# Patient Record
Sex: Male | Born: 1935 | Race: Black or African American | Hispanic: No | Marital: Married | Smoking: Never smoker
Health system: Southern US, Community
[De-identification: ages and names within clinical notes are randomized; demographics above are authoritative.]

## PROBLEM LIST (undated history)

## (undated) DIAGNOSIS — N200 Calculus of kidney: Secondary | ICD-10-CM

## (undated) DIAGNOSIS — I442 Atrioventricular block, complete: Secondary | ICD-10-CM

## (undated) DIAGNOSIS — G629 Polyneuropathy, unspecified: Secondary | ICD-10-CM

## (undated) DIAGNOSIS — G4730 Sleep apnea, unspecified: Secondary | ICD-10-CM

## (undated) DIAGNOSIS — M1990 Unspecified osteoarthritis, unspecified site: Secondary | ICD-10-CM

## (undated) DIAGNOSIS — E0590 Thyrotoxicosis, unspecified without thyrotoxic crisis or storm: Secondary | ICD-10-CM

## (undated) DIAGNOSIS — IMO0001 Reserved for inherently not codable concepts without codable children: Secondary | ICD-10-CM

## (undated) DIAGNOSIS — E119 Type 2 diabetes mellitus without complications: Secondary | ICD-10-CM

## (undated) DIAGNOSIS — I2510 Atherosclerotic heart disease of native coronary artery without angina pectoris: Secondary | ICD-10-CM

## (undated) DIAGNOSIS — E785 Hyperlipidemia, unspecified: Secondary | ICD-10-CM

## (undated) DIAGNOSIS — N189 Chronic kidney disease, unspecified: Secondary | ICD-10-CM

## (undated) DIAGNOSIS — N400 Enlarged prostate without lower urinary tract symptoms: Secondary | ICD-10-CM

## (undated) DIAGNOSIS — T8859XA Other complications of anesthesia, initial encounter: Secondary | ICD-10-CM

## (undated) DIAGNOSIS — M21371 Foot drop, right foot: Secondary | ICD-10-CM

## (undated) DIAGNOSIS — I495 Sick sinus syndrome: Secondary | ICD-10-CM

## (undated) DIAGNOSIS — E1140 Type 2 diabetes mellitus with diabetic neuropathy, unspecified: Secondary | ICD-10-CM

## (undated) DIAGNOSIS — I10 Essential (primary) hypertension: Secondary | ICD-10-CM

## (undated) DIAGNOSIS — Z950 Presence of cardiac pacemaker: Secondary | ICD-10-CM

## (undated) HISTORY — PX: CARDIAC CATHETERIZATION: SHX172

## (undated) HISTORY — DX: Foot drop, right foot: M21.371

## (undated) HISTORY — PX: HERNIA REPAIR: SHX51

## (undated) HISTORY — PX: EYE SURGERY: SHX253

## (undated) HISTORY — PX: MASS EXCISION: SHX2000

---

## 2005-05-21 ENCOUNTER — Ambulatory Visit: Payer: Self-pay | Admitting: Gastroenterology

## 2008-02-26 ENCOUNTER — Ambulatory Visit: Payer: Self-pay | Admitting: Urology

## 2008-03-10 ENCOUNTER — Ambulatory Visit: Payer: Self-pay | Admitting: Urology

## 2008-04-14 ENCOUNTER — Ambulatory Visit: Payer: Self-pay | Admitting: Urology

## 2008-04-25 ENCOUNTER — Ambulatory Visit: Payer: Self-pay | Admitting: Urology

## 2008-07-25 ENCOUNTER — Ambulatory Visit: Payer: Self-pay | Admitting: Urology

## 2009-02-23 ENCOUNTER — Ambulatory Visit: Payer: Self-pay | Admitting: Urology

## 2009-03-31 ENCOUNTER — Ambulatory Visit: Payer: Self-pay | Admitting: Gastroenterology

## 2009-03-31 LAB — HM COLONOSCOPY

## 2011-03-13 ENCOUNTER — Ambulatory Visit: Payer: Self-pay | Admitting: Urology

## 2012-04-13 LAB — TSH: TSH: 0.44 u[IU]/mL (ref <=5.90)

## 2013-04-05 LAB — CBC AND DIFFERENTIAL
HEMATOCRIT: 43 % (ref 41–53)
Hemoglobin: 14.3 g/dL (ref 13.5–17.5)
Neutrophils Absolute: 50 /uL
PLATELETS: 193 10*3/uL (ref 150–399)
WBC: 5.2 10*3/mL

## 2013-04-06 DIAGNOSIS — N200 Calculus of kidney: Secondary | ICD-10-CM | POA: Insufficient documentation

## 2013-04-06 DIAGNOSIS — R972 Elevated prostate specific antigen [PSA]: Secondary | ICD-10-CM | POA: Insufficient documentation

## 2013-04-06 DIAGNOSIS — R3129 Other microscopic hematuria: Secondary | ICD-10-CM | POA: Insufficient documentation

## 2013-04-06 DIAGNOSIS — N138 Other obstructive and reflux uropathy: Secondary | ICD-10-CM | POA: Insufficient documentation

## 2013-04-06 DIAGNOSIS — R339 Retention of urine, unspecified: Secondary | ICD-10-CM | POA: Insufficient documentation

## 2013-06-08 DIAGNOSIS — R5381 Other malaise: Secondary | ICD-10-CM | POA: Diagnosis not present

## 2013-06-08 DIAGNOSIS — E7800 Pure hypercholesterolemia, unspecified: Secondary | ICD-10-CM | POA: Diagnosis not present

## 2013-06-08 DIAGNOSIS — Z23 Encounter for immunization: Secondary | ICD-10-CM | POA: Diagnosis not present

## 2013-06-08 DIAGNOSIS — Z79899 Other long term (current) drug therapy: Secondary | ICD-10-CM | POA: Diagnosis not present

## 2013-06-08 DIAGNOSIS — R5383 Other fatigue: Secondary | ICD-10-CM | POA: Diagnosis not present

## 2013-06-08 DIAGNOSIS — E039 Hypothyroidism, unspecified: Secondary | ICD-10-CM | POA: Diagnosis not present

## 2013-06-08 DIAGNOSIS — I2510 Atherosclerotic heart disease of native coronary artery without angina pectoris: Secondary | ICD-10-CM | POA: Diagnosis not present

## 2013-06-08 DIAGNOSIS — Z0000 Encounter for general adult medical examination without abnormal findings: Secondary | ICD-10-CM | POA: Diagnosis not present

## 2013-07-07 DIAGNOSIS — N200 Calculus of kidney: Secondary | ICD-10-CM | POA: Diagnosis not present

## 2013-07-07 DIAGNOSIS — R972 Elevated prostate specific antigen [PSA]: Secondary | ICD-10-CM | POA: Diagnosis not present

## 2013-07-07 DIAGNOSIS — R339 Retention of urine, unspecified: Secondary | ICD-10-CM | POA: Diagnosis not present

## 2013-07-07 DIAGNOSIS — N401 Enlarged prostate with lower urinary tract symptoms: Secondary | ICD-10-CM | POA: Diagnosis not present

## 2013-08-19 DIAGNOSIS — R972 Elevated prostate specific antigen [PSA]: Secondary | ICD-10-CM | POA: Diagnosis not present

## 2013-10-04 DIAGNOSIS — R5381 Other malaise: Secondary | ICD-10-CM | POA: Diagnosis not present

## 2013-10-04 DIAGNOSIS — I10 Essential (primary) hypertension: Secondary | ICD-10-CM | POA: Diagnosis not present

## 2013-10-04 DIAGNOSIS — R7309 Other abnormal glucose: Secondary | ICD-10-CM | POA: Diagnosis not present

## 2013-10-04 DIAGNOSIS — Z79899 Other long term (current) drug therapy: Secondary | ICD-10-CM | POA: Diagnosis not present

## 2013-10-04 DIAGNOSIS — I2510 Atherosclerotic heart disease of native coronary artery without angina pectoris: Secondary | ICD-10-CM | POA: Diagnosis not present

## 2013-10-04 DIAGNOSIS — R5383 Other fatigue: Secondary | ICD-10-CM | POA: Diagnosis not present

## 2013-10-12 DIAGNOSIS — E0590 Thyrotoxicosis, unspecified without thyrotoxic crisis or storm: Secondary | ICD-10-CM | POA: Diagnosis not present

## 2013-10-19 DIAGNOSIS — E0590 Thyrotoxicosis, unspecified without thyrotoxic crisis or storm: Secondary | ICD-10-CM | POA: Diagnosis not present

## 2013-10-19 DIAGNOSIS — I10 Essential (primary) hypertension: Secondary | ICD-10-CM | POA: Insufficient documentation

## 2013-10-19 DIAGNOSIS — D126 Benign neoplasm of colon, unspecified: Secondary | ICD-10-CM | POA: Insufficient documentation

## 2013-11-25 DIAGNOSIS — H2510 Age-related nuclear cataract, unspecified eye: Secondary | ICD-10-CM | POA: Diagnosis not present

## 2014-03-07 DIAGNOSIS — I10 Essential (primary) hypertension: Secondary | ICD-10-CM | POA: Diagnosis not present

## 2014-03-07 DIAGNOSIS — E1143 Type 2 diabetes mellitus with diabetic autonomic (poly)neuropathy: Secondary | ICD-10-CM | POA: Diagnosis not present

## 2014-03-07 DIAGNOSIS — Z23 Encounter for immunization: Secondary | ICD-10-CM | POA: Diagnosis not present

## 2014-03-07 LAB — HEMOGLOBIN A1C: HEMOGLOBIN A1C: 5.6 % (ref 4.0–6.0)

## 2014-04-04 DIAGNOSIS — I259 Chronic ischemic heart disease, unspecified: Secondary | ICD-10-CM | POA: Diagnosis not present

## 2014-04-04 DIAGNOSIS — E780 Pure hypercholesterolemia: Secondary | ICD-10-CM | POA: Diagnosis not present

## 2014-04-04 DIAGNOSIS — I10 Essential (primary) hypertension: Secondary | ICD-10-CM | POA: Diagnosis not present

## 2014-04-04 LAB — LIPID PANEL: Triglycerides: 46 mg/dL (ref 40–160)

## 2014-04-06 DIAGNOSIS — I2510 Atherosclerotic heart disease of native coronary artery without angina pectoris: Secondary | ICD-10-CM | POA: Diagnosis not present

## 2014-04-06 LAB — LIPID PANEL
CHOLESTEROL: 243 mg/dL — AB (ref 0–200)
HDL: 77 mg/dL — AB (ref 35–70)
LDL Cholesterol: 157 mg/dL
LDl/HDL Ratio: 2

## 2014-04-06 LAB — BASIC METABOLIC PANEL
BUN: 15 mg/dL (ref 4–21)
Creatinine: 0.8 mg/dL (ref <=1.3)
Glucose: 146 mg/dL
POTASSIUM: 4 mmol/L (ref 3.4–5.3)
SODIUM: 143 mmol/L (ref 137–147)

## 2014-04-06 LAB — HEPATIC FUNCTION PANEL
ALT: 38 U/L (ref 10–40)
AST: 41 U/L — AB (ref 14–40)
Alkaline Phosphatase: 100 U/L (ref 25–125)
Bilirubin, Total: 0.4 mg/dL

## 2014-04-19 DIAGNOSIS — E0590 Thyrotoxicosis, unspecified without thyrotoxic crisis or storm: Secondary | ICD-10-CM | POA: Diagnosis not present

## 2014-04-19 DIAGNOSIS — M791 Myalgia: Secondary | ICD-10-CM | POA: Diagnosis not present

## 2014-08-09 DIAGNOSIS — N401 Enlarged prostate with lower urinary tract symptoms: Secondary | ICD-10-CM | POA: Diagnosis not present

## 2014-08-09 DIAGNOSIS — N200 Calculus of kidney: Secondary | ICD-10-CM | POA: Diagnosis not present

## 2014-08-09 DIAGNOSIS — R339 Retention of urine, unspecified: Secondary | ICD-10-CM | POA: Diagnosis not present

## 2014-08-09 DIAGNOSIS — R972 Elevated prostate specific antigen [PSA]: Secondary | ICD-10-CM | POA: Diagnosis not present

## 2014-08-09 DIAGNOSIS — M479 Spondylosis, unspecified: Secondary | ICD-10-CM | POA: Diagnosis not present

## 2014-08-31 DIAGNOSIS — R339 Retention of urine, unspecified: Secondary | ICD-10-CM | POA: Diagnosis not present

## 2014-09-06 DIAGNOSIS — E780 Pure hypercholesterolemia: Secondary | ICD-10-CM | POA: Diagnosis not present

## 2014-09-06 DIAGNOSIS — Z0000 Encounter for general adult medical examination without abnormal findings: Secondary | ICD-10-CM | POA: Diagnosis not present

## 2014-09-06 DIAGNOSIS — I259 Chronic ischemic heart disease, unspecified: Secondary | ICD-10-CM | POA: Diagnosis not present

## 2014-10-17 DIAGNOSIS — G4730 Sleep apnea, unspecified: Secondary | ICD-10-CM | POA: Insufficient documentation

## 2014-10-17 DIAGNOSIS — E785 Hyperlipidemia, unspecified: Secondary | ICD-10-CM | POA: Insufficient documentation

## 2014-10-17 DIAGNOSIS — D4410 Neoplasm of uncertain behavior of unspecified adrenal gland: Secondary | ICD-10-CM | POA: Insufficient documentation

## 2014-10-17 DIAGNOSIS — E1140 Type 2 diabetes mellitus with diabetic neuropathy, unspecified: Secondary | ICD-10-CM | POA: Insufficient documentation

## 2014-10-17 DIAGNOSIS — M779 Enthesopathy, unspecified: Secondary | ICD-10-CM | POA: Insufficient documentation

## 2014-10-17 DIAGNOSIS — N400 Enlarged prostate without lower urinary tract symptoms: Secondary | ICD-10-CM | POA: Insufficient documentation

## 2014-10-17 DIAGNOSIS — I2510 Atherosclerotic heart disease of native coronary artery without angina pectoris: Secondary | ICD-10-CM | POA: Insufficient documentation

## 2014-10-17 DIAGNOSIS — E1159 Type 2 diabetes mellitus with other circulatory complications: Secondary | ICD-10-CM | POA: Insufficient documentation

## 2014-10-17 DIAGNOSIS — E1169 Type 2 diabetes mellitus with other specified complication: Secondary | ICD-10-CM | POA: Insufficient documentation

## 2014-10-17 DIAGNOSIS — I10 Essential (primary) hypertension: Secondary | ICD-10-CM | POA: Insufficient documentation

## 2014-10-17 DIAGNOSIS — E0590 Thyrotoxicosis, unspecified without thyrotoxic crisis or storm: Secondary | ICD-10-CM | POA: Insufficient documentation

## 2014-10-17 DIAGNOSIS — G629 Polyneuropathy, unspecified: Secondary | ICD-10-CM | POA: Insufficient documentation

## 2014-10-17 DIAGNOSIS — E119 Type 2 diabetes mellitus without complications: Secondary | ICD-10-CM | POA: Insufficient documentation

## 2014-12-14 ENCOUNTER — Encounter: Payer: Self-pay | Admitting: Family Medicine

## 2014-12-14 ENCOUNTER — Other Ambulatory Visit: Payer: Self-pay | Admitting: Family Medicine

## 2014-12-14 ENCOUNTER — Ambulatory Visit (INDEPENDENT_AMBULATORY_CARE_PROVIDER_SITE_OTHER): Payer: Commercial Managed Care - HMO | Admitting: Family Medicine

## 2014-12-14 VITALS — BP 118/64 | HR 62 | Temp 97.8°F | Resp 16 | Ht 66.0 in | Wt 170.0 lb

## 2014-12-14 DIAGNOSIS — I159 Secondary hypertension, unspecified: Secondary | ICD-10-CM

## 2014-12-14 DIAGNOSIS — G629 Polyneuropathy, unspecified: Secondary | ICD-10-CM | POA: Diagnosis not present

## 2014-12-14 DIAGNOSIS — E785 Hyperlipidemia, unspecified: Secondary | ICD-10-CM | POA: Diagnosis not present

## 2014-12-14 DIAGNOSIS — R200 Anesthesia of skin: Secondary | ICD-10-CM | POA: Diagnosis not present

## 2014-12-14 DIAGNOSIS — E119 Type 2 diabetes mellitus without complications: Secondary | ICD-10-CM | POA: Diagnosis not present

## 2014-12-14 LAB — POCT GLYCOSYLATED HEMOGLOBIN (HGB A1C): HEMOGLOBIN A1C: 6.1

## 2014-12-14 NOTE — Progress Notes (Signed)
Patient ID: Todd Ortega, male   DOB: 1935-01-23, 80 y.o.   MRN: [id number]    Subjective:  Diabetes He presents for his follow-up diabetic visit. He has type 2 diabetes mellitus. Associated symptoms include foot paresthesias. Symptoms are stable.  Hypertension This is a chronic problem. The current episode started more than 1 year ago.  Coronary Artery Disease Presents for follow-up visit. Risk factors include hypertension.     Prior to Admission medications   Medication Sig Start Date End Date Taking? Authorizing Provider  aspirin 81 MG tablet Take by mouth. 08/05/12  Yes Historical Provider, MD  finasteride (PROSCAR) 5 MG tablet Take by mouth.   Yes Historical Provider, MD  glucose blood test strip BAYER CONTOUR TEST (In Vitro Strip)  1 (one) Strip Strip check sugars once daily, or as needed for 0 days  Quantity: 300;  Refills: 4   Ordered :31-Mar-2013  [name] ;  Started 31-Mar-2013 Active Comments: Medication taken as needed. 03/31/13  Yes Historical Provider, MD  lisinopril (PRINIVIL,ZESTRIL) 40 MG tablet Take by mouth. 08/29/14  Yes Historical Provider, MD  methimazole (TAPAZOLE) 5 MG tablet Take by mouth. 08/05/12  Yes Historical Provider, MD  simvastatin (ZOCOR) 40 MG tablet Take by mouth. 08/02/14  Yes Historical Provider, MD  tamsulosin (FLOMAX) 0.4 MG CAPS capsule Take 0.4 mg by mouth. 08/09/14  Yes Historical Provider, MD  CALCIUM CARBONATE-VITAMIN D PO Take by mouth. 08/05/12   Historical Provider, MD  ezetimibe (ZETIA) 10 MG tablet Take by mouth.    Historical Provider, MD  levothyroxine (SYNTHROID, LEVOTHROID) 175 MCG tablet Take by mouth.    Historical Provider, MD  meloxicam (MOBIC) 7.5 MG tablet Take by mouth.    Historical Provider, MD  Omega-3 Fatty Acids (FISH OIL) 1200 MG CAPS Take by mouth. 08/05/12   Historical Provider, MD  traMADol (ULTRAM) 50 MG tablet Take by mouth.    Historical Provider, MD    Patient Active Problem List   Diagnosis Date Noted  . Benign fibroma  of prostate 10/17/2014  . Arteriosclerosis of coronary artery 10/17/2014  . Essential (primary) hypertension 10/17/2014  . Enthesopathy 10/17/2014  . HLD (hyperlipidemia) 10/17/2014  . Hyperthyroidism 10/17/2014  . 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 Neuropathy 10/17/2014  . Apnea, sleep 10/17/2014  . Diabetes mellitus, type 2 10/17/2014  . Benign neoplasm of colon 10/19/2013  . BP (high blood pressure) 10/19/2013  . Benign prostatic hyperplasia with urinary obstruction 04/06/2013  . Calculus of kidney 04/06/2013  . Abnormal prostate specific antigen 04/06/2013  . Frank hematuria 04/06/2013  . Incomplete bladder emptying 04/06/2013    No past medical history on file.  History   Social History  . Marital Status: Married    Spouse Name: N/A  . Number of Children: N/A  . Years of Education: N/A   Occupational History  . Not on file.   Social History Main Topics  . Smoking status: Never Smoker   . Smokeless tobacco: Not on file  . Alcohol Use: No  . Drug Use: No  . Sexual Activity: Not on file   Other Topics Concern  . Not on file   Social History Narrative    No Known Allergies  Review of Systems  Constitutional: Negative.   HENT: Negative.   Eyes: Negative.        Saw an eye Dr. In May 2016   Respiratory: Negative.   Cardiovascular: Negative.   Gastrointestinal: Negative.   Genitourinary: Negative.  Musculoskeletal: Negative.   Neurological: Positive for tingling.       Tingling in feet and right hand  Endo/Heme/Allergies: Negative.     Immunization History  Administered Date(s) Administered  . Pneumococcal Conjugate-13 03/07/2014  . Pneumococcal Polysaccharide-23 12/04/2011  . Tdap 02/23/2011   Objective:  BP 118/64 mmHg  Pulse 62  Temp(Src) 97.8 F (36.6 C) (Oral)  Resp 16  Ht 5\' 6"  (1.676 m)  Wt 170 lb (77.111 kg)  BMI 27.45 kg/m2  Physical Exam  Constitutional: He is oriented to person, place, and time and  well-developed, well-nourished, and in no distress.  HENT:  Head: Normocephalic and atraumatic.  Right Ear: External ear normal.  Left Ear: External ear normal.  Nose: Nose normal.  Eyes: Conjunctivae are normal. Pupils are equal, round, and reactive to light.  Neck: Normal range of motion. Neck supple.  Cardiovascular: Normal rate, regular rhythm and normal heart sounds.   Pulmonary/Chest: Effort normal and breath sounds normal.  Abdominal: Soft. Bowel sounds are normal.  Neurological: He is alert and oriented to person, place, and time. Gait normal.  All issues with gait as patient is having some issues with proprioception due to neuropathy of his feet and legs.  Skin: Skin is warm and dry.  Psychiatric: Mood, memory, affect and judgment normal.    Lab Results  Component Value Date   WBC 5.2 04/05/2013   HGB 14.3 04/05/2013   HCT 43 04/05/2013   PLT 193 04/05/2013   CHOL 243* 04/06/2014   TRIG 46 04/04/2014   HDL 77* 04/06/2014   LDLCALC 157 04/06/2014   TSH 0.44 04/13/2012   HGBA1C 6.1 12/14/2014    CMP     Component Value Date/Time   NA 143 04/06/2014   K 4.0 04/06/2014   BUN 15 04/06/2014   CREATININE 0.8 04/06/2014   AST 41* 04/06/2014   ALT 38 04/06/2014   ALKPHOS 100 04/06/2014    Assessment and Plan :  CAD without angina All risk factors treated Type 2 diabetes/prediabetes A1c is 6.1% today, good control. Discussed with patient and wife that anything less than 7 is considered good the approach is 80 years old. Lead and allowed to drift a little bit further than that. Hyperetension Hyperlipidemia BPH All issues were discussed with patient and wife. We'll see him back in 4-6 months. Miguel Aschoff MD [facility] 12/14/2014 12:17 PM

## 2014-12-15 LAB — TSH: TSH: 1.2 u[IU]/mL (ref 0.450–4.500)

## 2014-12-15 LAB — COMPREHENSIVE METABOLIC PANEL
ALBUMIN: 4.1 g/dL (ref 3.5–4.8)
ALK PHOS: 97 IU/L (ref 39–117)
ALT: 33 IU/L (ref 0–44)
AST: 33 IU/L (ref 0–40)
Albumin/Globulin Ratio: 1.3 (ref 1.1–2.5)
BILIRUBIN TOTAL: 0.6 mg/dL (ref 0.0–1.2)
BUN/Creatinine Ratio: 21 (ref 10–22)
BUN: 17 mg/dL (ref 8–27)
CO2: 21 mmol/L (ref 18–29)
Calcium: 9.5 mg/dL (ref 8.6–10.2)
Chloride: 101 mmol/L (ref 97–108)
Creatinine, Ser: 0.81 mg/dL (ref 0.76–1.27)
GFR calc Af Amer: 99 mL/min/{1.73_m2} (ref >59)
GFR, EST NON AFRICAN AMERICAN: 86 mL/min/{1.73_m2} (ref >59)
GLUCOSE: 111 mg/dL — AB (ref 65–99)
Globulin, Total: 3.1 g/dL (ref 1.5–4.5)
POTASSIUM: 4.4 mmol/L (ref 3.5–5.2)
Sodium: 139 mmol/L (ref 134–144)
TOTAL PROTEIN: 7.2 g/dL (ref 6.0–8.5)

## 2014-12-15 LAB — CBC WITH DIFFERENTIAL/PLATELET
Basophils Absolute: 0 10*3/uL (ref 0.0–0.2)
Basos: 0 %
EOS (ABSOLUTE): 0.1 10*3/uL (ref 0.0–0.4)
Eos: 1 %
Hematocrit: 43.9 % (ref 37.5–51.0)
Hemoglobin: 14.5 g/dL (ref 12.6–17.7)
Immature Grans (Abs): 0 10*3/uL (ref 0.0–0.1)
Immature Granulocytes: 0 %
Lymphocytes Absolute: 2.4 10*3/uL (ref 0.7–3.1)
Lymphs: 47 %
MCH: 30.6 pg (ref 26.6–33.0)
MCHC: 33 g/dL (ref 31.5–35.7)
MCV: 93 fL (ref 79–97)
Monocytes Absolute: 0.4 10*3/uL (ref 0.1–0.9)
Monocytes: 7 %
Neutrophils Absolute: 2.4 10*3/uL (ref 1.4–7.0)
Neutrophils: 45 %
Platelets: 180 10*3/uL (ref 150–379)
RBC: 4.74 x10E6/uL (ref 4.14–5.80)
RDW: 14.7 % (ref 12.3–15.4)
WBC: 5.3 10*3/uL (ref 3.4–10.8)

## 2014-12-15 LAB — LIPID PANEL WITH LDL/HDL RATIO
CHOLESTEROL TOTAL: 176 mg/dL (ref 100–199)
HDL: 75 mg/dL (ref >39)
LDL Calculated: 91 mg/dL (ref 0–99)
LDl/HDL Ratio: 1.2 ratio units (ref 0.0–3.6)
Triglycerides: 51 mg/dL (ref 0–149)
VLDL Cholesterol Cal: 10 mg/dL (ref 5–40)

## 2014-12-15 LAB — B12 AND FOLATE PANEL
Folate: 17.7 ng/mL (ref >3.0)
VITAMIN B 12: 304 pg/mL (ref 211–946)

## 2014-12-20 ENCOUNTER — Telehealth: Payer: Self-pay

## 2014-12-20 NOTE — Telephone Encounter (Signed)
LMTCB  aa 

## 2014-12-20 NOTE — Telephone Encounter (Signed)
-----   Message from [name]., MD sent at 12/17/2014  5:01 PM EDT ----- Labs okay

## 2014-12-22 NOTE — Telephone Encounter (Signed)
LMTCB  aa 

## 2014-12-22 NOTE — Telephone Encounter (Signed)
Patient advised  ED 

## 2015-01-05 ENCOUNTER — Telehealth: Payer: Self-pay | Admitting: Family Medicine

## 2015-01-05 NOTE — Telephone Encounter (Signed)
Patient states that he has Neuropathy and is off balance and needs a note stating that he needs to be on light duty until he sees the neurologist on 01/27/2015.  Call patient when ready.  CB# [phone]

## 2015-01-05 NOTE — Telephone Encounter (Signed)
See below please. 

## 2015-01-09 NOTE — Telephone Encounter (Signed)
Ok to do this 

## 2015-01-09 NOTE — Telephone Encounter (Signed)
Note written out and pt advised it is ready for pick up -aa

## 2015-01-27 DIAGNOSIS — R202 Paresthesia of skin: Secondary | ICD-10-CM | POA: Diagnosis not present

## 2015-01-27 DIAGNOSIS — M2142 Flat foot [pes planus] (acquired), left foot: Secondary | ICD-10-CM | POA: Diagnosis not present

## 2015-01-27 DIAGNOSIS — M2141 Flat foot [pes planus] (acquired), right foot: Secondary | ICD-10-CM | POA: Diagnosis not present

## 2015-01-27 DIAGNOSIS — H538 Other visual disturbances: Secondary | ICD-10-CM | POA: Diagnosis not present

## 2015-01-30 DIAGNOSIS — G629 Polyneuropathy, unspecified: Secondary | ICD-10-CM | POA: Diagnosis not present

## 2015-01-30 DIAGNOSIS — M2141 Flat foot [pes planus] (acquired), right foot: Secondary | ICD-10-CM | POA: Diagnosis not present

## 2015-01-30 DIAGNOSIS — H538 Other visual disturbances: Secondary | ICD-10-CM | POA: Diagnosis not present

## 2015-01-30 DIAGNOSIS — M2142 Flat foot [pes planus] (acquired), left foot: Secondary | ICD-10-CM | POA: Diagnosis not present

## 2015-01-30 DIAGNOSIS — R202 Paresthesia of skin: Secondary | ICD-10-CM | POA: Diagnosis not present

## 2015-04-18 ENCOUNTER — Ambulatory Visit (INDEPENDENT_AMBULATORY_CARE_PROVIDER_SITE_OTHER): Payer: Commercial Managed Care - HMO | Admitting: Family Medicine

## 2015-04-18 ENCOUNTER — Encounter: Payer: Self-pay | Admitting: Family Medicine

## 2015-04-18 VITALS — BP 116/60 | HR 40 | Temp 97.8°F | Resp 16 | Wt 190.0 lb

## 2015-04-18 DIAGNOSIS — R0602 Shortness of breath: Secondary | ICD-10-CM | POA: Diagnosis not present

## 2015-04-18 DIAGNOSIS — I10 Essential (primary) hypertension: Secondary | ICD-10-CM

## 2015-04-18 DIAGNOSIS — E1140 Type 2 diabetes mellitus with diabetic neuropathy, unspecified: Secondary | ICD-10-CM | POA: Diagnosis not present

## 2015-04-18 DIAGNOSIS — Z01818 Encounter for other preprocedural examination: Secondary | ICD-10-CM | POA: Diagnosis not present

## 2015-04-18 DIAGNOSIS — R011 Cardiac murmur, unspecified: Secondary | ICD-10-CM | POA: Diagnosis not present

## 2015-04-18 DIAGNOSIS — I442 Atrioventricular block, complete: Secondary | ICD-10-CM | POA: Diagnosis not present

## 2015-04-18 DIAGNOSIS — E0590 Thyrotoxicosis, unspecified without thyrotoxic crisis or storm: Secondary | ICD-10-CM

## 2015-04-18 DIAGNOSIS — R001 Bradycardia, unspecified: Secondary | ICD-10-CM

## 2015-04-18 DIAGNOSIS — N400 Enlarged prostate without lower urinary tract symptoms: Secondary | ICD-10-CM | POA: Diagnosis not present

## 2015-04-18 DIAGNOSIS — E079 Disorder of thyroid, unspecified: Secondary | ICD-10-CM | POA: Diagnosis not present

## 2015-04-18 DIAGNOSIS — E784 Other hyperlipidemia: Secondary | ICD-10-CM | POA: Diagnosis not present

## 2015-04-18 LAB — POCT UA - MICROALBUMIN: Microalbumin Ur, POC: 50 mg/L

## 2015-04-18 NOTE — Progress Notes (Signed)
Patient ID: Todd Ortega, male   DOB: [birth year], 80 y.o.   MRN: [id number]    Subjective:  HPI  Diabetes Mellitus Type II, Follow-up:   Lab Results  Component Value Date   HGBA1C 6.1 12/14/2014   HGBA1C 5.6 03/07/2014    Last seen for diabetes 4 months ago.  Management since then includes none. He reports good compliance with treatment. He is not having side effects.  Current symptoms include shortness of breath with exercertion and low heart rate when checking vitals today.  Home blood sugar records: not being checked recently  Episodes of hypoglycemia? no   Current Insulin Regimen: n/a Most Recent Eye Exam: within a year. Current exercise: none  Pertinent Labs:    Component Value Date/Time   CHOL 176 12/14/2014 1258   CHOL 243* 04/06/2014   TRIG 51 12/14/2014 1258   CREATININE 0.81 12/14/2014 1258   CREATININE 0.8 04/06/2014    Wt Readings from Last 3 Encounters:  04/18/15 190 lb (86.183 kg)  12/14/14 170 lb (77.111 kg)  09/06/14 170 lb (77.111 kg)    ------------------------------------------------------------------------  Pt reports that he is having shortness of breath when coming up the stairs today. When checking his vitals I noticed that his heart rate was low in the 40's. He reports that he has a cough with congestion that he can not get up and has been taking OTC cold medications for this. He denies any chest pain. But says that he is sore around his ribs area from coughing.    Prior to Admission medications   Medication Sig Start Date End Date Taking? Authorizing Provider  aspirin 81 MG tablet Take by mouth. 08/05/12  Yes Historical Provider, MD  CALCIUM CARBONATE-VITAMIN D PO Take by mouth. 08/05/12  Yes Historical Provider, MD  Cholecalciferol (VITAMIN D3) 2000 UNITS capsule Take by mouth.   Yes Historical Provider, MD  ezetimibe (ZETIA) 10 MG tablet Take by mouth.   Yes Historical Provider, MD  finasteride (PROSCAR) 5 MG tablet Take by mouth.   Yes  Historical Provider, MD  gabapentin (NEURONTIN) 300 MG capsule Take by mouth. 03/10/15 06/08/15 Yes Historical Provider, MD  glucose blood test strip BAYER CONTOUR TEST (In Vitro Strip)  1 (one) Strip Strip check sugars once daily, or as needed for 0 days  Quantity: 300;  Refills: 4   Ordered :31-Mar-2013  [name] ;  Started 31-Mar-2013 Active Comments: Medication taken as needed. 03/31/13  Yes Historical Provider, MD  levothyroxine (SYNTHROID, LEVOTHROID) 175 MCG tablet Take by mouth.   Yes Historical Provider, MD  lisinopril (PRINIVIL,ZESTRIL) 40 MG tablet Take by mouth. 08/29/14  Yes Historical Provider, MD  meloxicam (MOBIC) 7.5 MG tablet Take by mouth.   Yes Historical Provider, MD  methimazole (TAPAZOLE) 5 MG tablet Take by mouth. 08/05/12  Yes Historical Provider, MD  Omega-3 Fatty Acids (FISH OIL) 1200 MG CAPS Take by mouth. 08/05/12  Yes Historical Provider, MD  simvastatin (ZOCOR) 40 MG tablet Take by mouth. 08/02/14  Yes Historical Provider, MD  tamsulosin (FLOMAX) 0.4 MG CAPS capsule Take 0.4 mg by mouth. 08/09/14  Yes Historical Provider, MD  traMADol (ULTRAM) 50 MG tablet Take by mouth.   Yes Historical Provider, MD    Patient Active Problem List   Diagnosis Date Noted  . Benign fibroma of prostate 10/17/2014  . Arteriosclerosis of coronary artery 10/17/2014  . Essential (primary) hypertension 10/17/2014  . Enthesopathy 10/17/2014  . HLD (hyperlipidemia) 10/17/2014  . Hyperthyroidism 10/17/2014  . Neoplasm of uncertain  behavior of adrenal gland 10/17/2014  . Neuropathy ([place]) 10/17/2014  . Apnea, sleep 10/17/2014  . Diabetes mellitus, type 2 ([place]) 10/17/2014  . Benign neoplasm of colon 10/19/2013  . BP (high blood pressure) 10/19/2013  . Benign prostatic hyperplasia with urinary obstruction 04/06/2013  . Calculus of kidney 04/06/2013  . Abnormal prostate specific antigen 04/06/2013  . Frank hematuria 04/06/2013  . Incomplete bladder emptying 04/06/2013    History reviewed.  No pertinent past medical history.  Social History   Social History  . Marital Status: Married    Spouse Name: N/A  . Number of Children: N/A  . Years of Education: N/A   Occupational History  . Not on file.   Social History Main Topics  . Smoking status: Never Smoker   . Smokeless tobacco: Not on file  . Alcohol Use: No  . Drug Use: No  . Sexual Activity: Not on file   Other Topics Concern  . Not on file   Social History Narrative    No Known Allergies  Review of Systems  Constitutional: Negative.   HENT: Negative.   Eyes: Negative.   Respiratory: Positive for cough and shortness of breath.   Cardiovascular: Negative.   Gastrointestinal: Negative.   Genitourinary: Negative.   Musculoskeletal: Negative.   Skin: Negative.   Neurological: Negative.   Endo/Heme/Allergies: Negative.   Psychiatric/Behavioral: Negative.     Immunization History  Administered Date(s) Administered  . Pneumococcal Conjugate-13 03/07/2014  . Pneumococcal Polysaccharide-23 12/04/2011  . Tdap 02/23/2011   Objective:  BP 116/60 mmHg  Pulse 40  Temp(Src) 97.8 F (36.6 C) (Oral)  Resp 16  Wt 190 lb (86.183 kg)  SpO2 97%  Physical Exam  Constitutional: He is oriented to person, place, and time and well-developed, well-nourished, and in no distress.  HENT:  Head: Normocephalic and atraumatic.  Right Ear: External ear normal.  Left Ear: External ear normal.  Nose: Nose normal.  Mouth/Throat: Oropharynx is clear and moist.  Eyes: Conjunctivae and EOM are normal. Pupils are equal, round, and reactive to light.  Neck: Normal range of motion. Neck supple.  Cardiovascular: Intact distal pulses.   Pulmonary/Chest: Effort normal and breath sounds normal.  Abdominal: Soft. Bowel sounds are normal.  Musculoskeletal: Normal range of motion.  Neurological: He is alert and oriented to person, place, and time. He has normal reflexes. Gait normal. GCS score is 15.  Skin: Skin is warm and dry.   Psychiatric: Mood, memory, affect and judgment normal.    Lab Results  Component Value Date   WBC 5.3 12/14/2014   HGB 14.3 04/05/2013   HCT 43.9 12/14/2014   PLT 193 04/05/2013   GLUCOSE 111* 12/14/2014   CHOL 176 12/14/2014   TRIG 51 12/14/2014   HDL 75 12/14/2014   LDLCALC 91 12/14/2014   TSH 1.200 12/14/2014   HGBA1C 6.1 12/14/2014    CMP     Component Value Date/Time   NA 139 12/14/2014 1258   K 4.4 12/14/2014 1258   CL 101 12/14/2014 1258   CO2 21 12/14/2014 1258   GLUCOSE 111* 12/14/2014 1258   BUN 17 12/14/2014 1258   CREATININE 0.81 12/14/2014 1258   CREATININE 0.8 04/06/2014   CALCIUM 9.5 12/14/2014 1258   PROT 7.2 12/14/2014 1258   ALBUMIN 4.1 12/14/2014 1258   AST 33 12/14/2014 1258   ALT 33 12/14/2014 1258   ALKPHOS 97 12/14/2014 1258   BILITOT 0.6 12/14/2014 1258   GFRNONAA 86 12/14/2014 1258   GFRAA 99  12/14/2014 1258    Assessment and Plan :  1. Bradycardia Immediate referral to cardiology. No anginal/ischemic symptoms but this needs to be addressed. - EKG 12-Lead - POCT HgB A1C - Ambulatory referral to Cardiology - CBC with Differential/Platelet  2. Shortness of breath on exertion  - EKG 12-Lead - POCT HgB A1C--6.1 today - Ambulatory referral to Cardiology - CBC with Differential/Platelet  3. Type 2 diabetes mellitus with diabetic neuropathy, without long-term current use of insulin (HCC)  - POCT UA - Microalbumin - CBC with Differential/Platelet  4. Hyperthyroidism  - TSH  5. Essential (primary) hypertension  - TSH - Comprehensive metabolic panel   Patient was seen and examined by Dr. Miguel Aschoff, and noted scribed by Webb Laws, [place] MD [facility] 04/18/2015 12:06 PM

## 2015-04-19 ENCOUNTER — Ambulatory Visit
Admission: RE | Admit: 2015-04-19 | Discharge: 2015-04-19 | Disposition: A | Discharge status: Home / Self Care | Payer: Commercial Managed Care - HMO | Source: Ambulatory Visit | Attending: Cardiology | Admitting: Cardiology

## 2015-04-19 ENCOUNTER — Encounter
Admission: RE | Admit: 2015-04-19 | Discharge: 2015-04-19 | Disposition: A | Discharge status: Home / Self Care | Payer: Commercial Managed Care - HMO | Source: Ambulatory Visit | Attending: Cardiology | Admitting: Cardiology

## 2015-04-19 DIAGNOSIS — I517 Cardiomegaly: Secondary | ICD-10-CM | POA: Insufficient documentation

## 2015-04-19 DIAGNOSIS — I2510 Atherosclerotic heart disease of native coronary artery without angina pectoris: Secondary | ICD-10-CM | POA: Insufficient documentation

## 2015-04-19 DIAGNOSIS — Z01811 Encounter for preprocedural respiratory examination: Secondary | ICD-10-CM | POA: Diagnosis not present

## 2015-04-19 DIAGNOSIS — I4589 Other specified conduction disorders: Secondary | ICD-10-CM | POA: Diagnosis not present

## 2015-04-19 DIAGNOSIS — R001 Bradycardia, unspecified: Secondary | ICD-10-CM | POA: Insufficient documentation

## 2015-04-19 DIAGNOSIS — Z01810 Encounter for preprocedural cardiovascular examination: Secondary | ICD-10-CM | POA: Diagnosis not present

## 2015-04-19 HISTORY — DX: Thyrotoxicosis, unspecified without thyrotoxic crisis or storm: E05.90

## 2015-04-19 HISTORY — DX: Unspecified osteoarthritis, unspecified site: M19.90

## 2015-04-19 HISTORY — DX: Atrioventricular block, complete: I44.2

## 2015-04-19 HISTORY — DX: Calculus of kidney: N20.0

## 2015-04-19 HISTORY — DX: Sleep apnea, unspecified: G47.30

## 2015-04-19 HISTORY — DX: Chronic kidney disease, unspecified: N18.9

## 2015-04-19 HISTORY — DX: Essential (primary) hypertension: I10

## 2015-04-19 HISTORY — DX: Reserved for inherently not codable concepts without codable children: IMO0001

## 2015-04-19 HISTORY — DX: Type 2 diabetes mellitus without complications: E11.9

## 2015-04-19 HISTORY — DX: Benign prostatic hyperplasia without lower urinary tract symptoms: N40.0

## 2015-04-19 HISTORY — DX: Hyperlipidemia, unspecified: E78.5

## 2015-04-19 HISTORY — DX: Atherosclerotic heart disease of native coronary artery without angina pectoris: I25.10

## 2015-04-19 HISTORY — DX: Polyneuropathy, unspecified: G62.9

## 2015-04-19 LAB — PROTIME-INR
INR: 1.17
Prothrombin Time: 15.1 seconds — ABNORMAL HIGH (ref 11.4–15.0)

## 2015-04-19 LAB — DIFFERENTIAL
Basophils Absolute: 0 10*3/uL (ref 0–0.1)
Basophils Relative: 1 %
EOS PCT: 3 %
Eosinophils Absolute: 0.2 10*3/uL (ref 0–0.7)
LYMPHS PCT: 40 %
Lymphs Abs: 2.3 10*3/uL (ref 1.0–3.6)
MONO ABS: 0.6 10*3/uL (ref 0.2–1.0)
MONOS PCT: 11 %
NEUTROS ABS: 2.6 10*3/uL (ref 1.4–6.5)
Neutrophils Relative %: 45 %

## 2015-04-19 LAB — SURGICAL PCR SCREEN
MRSA, PCR: NEGATIVE
Staphylococcus aureus: NEGATIVE

## 2015-04-19 LAB — TSH: TSH: 1.37 u[IU]/mL (ref 0.450–4.500)

## 2015-04-19 LAB — COMPREHENSIVE METABOLIC PANEL
A/G RATIO: 1.3 (ref 1.1–2.5)
ALT: 31 IU/L (ref 0–44)
AST: 33 IU/L (ref 0–40)
Albumin: 3.9 g/dL (ref 3.5–4.8)
Alkaline Phosphatase: 91 IU/L (ref 39–117)
BILIRUBIN TOTAL: 0.4 mg/dL (ref 0.0–1.2)
BUN/Creatinine Ratio: 20 (ref 10–22)
BUN: 18 mg/dL (ref 8–27)
CALCIUM: 9.3 mg/dL (ref 8.6–10.2)
CO2: 23 mmol/L (ref 18–29)
Chloride: 104 mmol/L (ref 97–106)
Creatinine, Ser: 0.9 mg/dL (ref 0.76–1.27)
GFR calc Af Amer: 94 mL/min/{1.73_m2} (ref >59)
GFR, EST NON AFRICAN AMERICAN: 82 mL/min/{1.73_m2} (ref >59)
GLOBULIN, TOTAL: 2.9 g/dL (ref 1.5–4.5)
Glucose: 113 mg/dL — ABNORMAL HIGH (ref 65–99)
POTASSIUM: 4.2 mmol/L (ref 3.5–5.2)
SODIUM: 140 mmol/L (ref 136–144)
Total Protein: 6.8 g/dL (ref 6.0–8.5)

## 2015-04-19 LAB — BASIC METABOLIC PANEL
Anion gap: 5 (ref 5–15)
BUN: 21 mg/dL — AB (ref 6–20)
CHLORIDE: 109 mmol/L (ref 101–111)
CO2: 25 mmol/L (ref 22–32)
CREATININE: 0.88 mg/dL (ref 0.61–1.24)
Calcium: 9.1 mg/dL (ref 8.9–10.3)
GFR calc Af Amer: >60 mL/min (ref >60)
GFR calc non Af Amer: >60 mL/min (ref >60)
GLUCOSE: 150 mg/dL — AB (ref 65–99)
POTASSIUM: 3.9 mmol/L (ref 3.5–5.1)
Sodium: 139 mmol/L (ref 135–145)

## 2015-04-19 LAB — CBC WITH DIFFERENTIAL/PLATELET
BASOS: 0 %
Basophils Absolute: 0 10*3/uL (ref 0.0–0.2)
EOS (ABSOLUTE): 0.1 10*3/uL (ref 0.0–0.4)
Eos: 2 %
Hematocrit: 42.6 % (ref 37.5–51.0)
Hemoglobin: 13.9 g/dL (ref 12.6–17.7)
IMMATURE GRANS (ABS): 0 10*3/uL (ref 0.0–0.1)
Immature Granulocytes: 0 %
LYMPHS ABS: 3 10*3/uL (ref 0.7–3.1)
Lymphs: 48 %
MCH: 30.5 pg (ref 26.6–33.0)
MCHC: 32.6 g/dL (ref 31.5–35.7)
MCV: 93 fL (ref 79–97)
MONOS ABS: 0.7 10*3/uL (ref 0.1–0.9)
Monocytes: 11 %
NEUTROS ABS: 2.4 10*3/uL (ref 1.4–7.0)
Neutrophils: 39 %
PLATELETS: 169 10*3/uL (ref 150–379)
RBC: 4.56 x10E6/uL (ref 4.14–5.80)
RDW: 15 % (ref 12.3–15.4)
WBC: 6.2 10*3/uL (ref 3.4–10.8)

## 2015-04-19 LAB — CBC
HEMATOCRIT: 41.3 % (ref 40.0–52.0)
Hemoglobin: 13.7 g/dL (ref 13.0–18.0)
MCH: 31.3 pg (ref 26.0–34.0)
MCHC: 33.1 g/dL (ref 32.0–36.0)
MCV: 94.6 fL (ref 80.0–100.0)
Platelets: 142 10*3/uL — ABNORMAL LOW (ref 150–440)
RBC: 4.37 MIL/uL — ABNORMAL LOW (ref 4.40–5.90)
RDW: 14.6 % — AB (ref 11.5–14.5)
WBC: 5.7 10*3/uL (ref 3.8–10.6)

## 2015-04-19 LAB — APTT: aPTT: 29 seconds (ref 24–36)

## 2015-04-19 NOTE — Patient Instructions (Signed)
  Your procedure is scheduled on: April 20, 2015 (Thursday) Report to Day Surgery.[facility]) To find out your arrival time please call [phone] between 1PM - 3PM on April 19, 2015 (Today).  Remember: Instructions that are not followed completely may result in serious medical risk, up to and including death, or upon the discretion of your surgeon and anesthesiologist your surgery may need to be rescheduled.    __x__ 1. Do not eat food or drink liquids after midnight. No gum chewing or hard candies.     ____ 2. No Alcohol for 24 hours before or after surgery.   ____ 3. Bring all medications with you on the day of surgery if instructed.    __x__ 4. Notify your doctor if there is any change in your medical condition     (cold, fever, infections).     Do not wear jewelry, make-up, hairpins, clips or nail polish.  Do not wear lotions, powders, or perfumes. You may wear deodorant.  Do not shave 48 hours prior to surgery. Men may shave face and neck.  Do not bring valuables to the hospital.    [facility] is not responsible for any belongings or valuables.               Contacts, dentures or bridgework may not be worn into surgery.  Leave your suitcase in the car. After surgery it may be brought to your room.  For patients admitted to the hospital, discharge time is determined by your                treatment team.   Patients discharged the day of surgery will not be allowed to drive home.   Please read over the following fact sheets that you were given:   MRSA Information and Surgical Site Infection Prevention   ____ Take these medicines the morning of surgery with A SIP OF WATER:    1. Take all medications as instructed by cardiologist  2.   3.   4.  5.  6.  ____ Fleet Enema (as directed)   __x__ Use CHG Soap as directed  ____ Use inhalers on the day of surgery  ____ Stop metformin 2 days prior to surgery    ____ Take 1/2 of usual insulin dose the night before  surgery and none on the morning of surgery.   __x__ Stop Coumadin/Plavix/aspirin on  ____ Stop Anti-inflammatories on (STOP ALKA -SELTZER PLUS NOW)   __X__ Stop supplements until after surgery. (STOP FISH OIL NOW)   ____ Bring C-Pap to the hospital.

## 2015-04-20 ENCOUNTER — Ambulatory Visit: Payer: Commercial Managed Care - HMO

## 2015-04-20 ENCOUNTER — Observation Stay
Admission: RE | Admit: 2015-04-20 | Discharge: 2015-04-21 | Disposition: A | Discharge status: Home / Self Care | Payer: Commercial Managed Care - HMO | Source: Ambulatory Visit | Attending: Cardiology | Admitting: Cardiology

## 2015-04-20 ENCOUNTER — Ambulatory Visit: Payer: Commercial Managed Care - HMO | Admitting: Anesthesiology

## 2015-04-20 ENCOUNTER — Encounter
Admission: RE | Disposition: A | Discharge status: Home / Self Care | Payer: Self-pay | Source: Ambulatory Visit | Attending: Cardiology

## 2015-04-20 ENCOUNTER — Encounter: Payer: Self-pay | Admitting: *Deleted

## 2015-04-20 ENCOUNTER — Observation Stay: Payer: Commercial Managed Care - HMO

## 2015-04-20 DIAGNOSIS — I499 Cardiac arrhythmia, unspecified: Secondary | ICD-10-CM | POA: Diagnosis not present

## 2015-04-20 DIAGNOSIS — E785 Hyperlipidemia, unspecified: Secondary | ICD-10-CM | POA: Diagnosis not present

## 2015-04-20 DIAGNOSIS — Z809 Family history of malignant neoplasm, unspecified: Secondary | ICD-10-CM | POA: Insufficient documentation

## 2015-04-20 DIAGNOSIS — Z833 Family history of diabetes mellitus: Secondary | ICD-10-CM | POA: Diagnosis not present

## 2015-04-20 DIAGNOSIS — Z7982 Long term (current) use of aspirin: Secondary | ICD-10-CM | POA: Diagnosis not present

## 2015-04-20 DIAGNOSIS — I1310 Hypertensive heart and chronic kidney disease without heart failure, with stage 1 through stage 4 chronic kidney disease, or unspecified chronic kidney disease: Secondary | ICD-10-CM | POA: Insufficient documentation

## 2015-04-20 DIAGNOSIS — E079 Disorder of thyroid, unspecified: Secondary | ICD-10-CM | POA: Diagnosis not present

## 2015-04-20 DIAGNOSIS — E1122 Type 2 diabetes mellitus with diabetic chronic kidney disease: Secondary | ICD-10-CM | POA: Insufficient documentation

## 2015-04-20 DIAGNOSIS — I442 Atrioventricular block, complete: Principal | ICD-10-CM | POA: Diagnosis present

## 2015-04-20 DIAGNOSIS — E1140 Type 2 diabetes mellitus with diabetic neuropathy, unspecified: Secondary | ICD-10-CM | POA: Insufficient documentation

## 2015-04-20 DIAGNOSIS — N189 Chronic kidney disease, unspecified: Secondary | ICD-10-CM | POA: Diagnosis not present

## 2015-04-20 DIAGNOSIS — G4730 Sleep apnea, unspecified: Secondary | ICD-10-CM | POA: Insufficient documentation

## 2015-04-20 DIAGNOSIS — Z950 Presence of cardiac pacemaker: Secondary | ICD-10-CM | POA: Diagnosis not present

## 2015-04-20 DIAGNOSIS — I2510 Atherosclerotic heart disease of native coronary artery without angina pectoris: Secondary | ICD-10-CM | POA: Diagnosis not present

## 2015-04-20 DIAGNOSIS — N400 Enlarged prostate without lower urinary tract symptoms: Secondary | ICD-10-CM | POA: Insufficient documentation

## 2015-04-20 DIAGNOSIS — E119 Type 2 diabetes mellitus without complications: Secondary | ICD-10-CM | POA: Diagnosis not present

## 2015-04-20 DIAGNOSIS — I517 Cardiomegaly: Secondary | ICD-10-CM | POA: Insufficient documentation

## 2015-04-20 DIAGNOSIS — R0602 Shortness of breath: Secondary | ICD-10-CM | POA: Insufficient documentation

## 2015-04-20 DIAGNOSIS — Z8249 Family history of ischemic heart disease and other diseases of the circulatory system: Secondary | ICD-10-CM | POA: Diagnosis not present

## 2015-04-20 DIAGNOSIS — I10 Essential (primary) hypertension: Secondary | ICD-10-CM | POA: Diagnosis not present

## 2015-04-20 DIAGNOSIS — R001 Bradycardia, unspecified: Secondary | ICD-10-CM | POA: Diagnosis not present

## 2015-04-20 DIAGNOSIS — Z87442 Personal history of urinary calculi: Secondary | ICD-10-CM | POA: Diagnosis not present

## 2015-04-20 DIAGNOSIS — M1990 Unspecified osteoarthritis, unspecified site: Secondary | ICD-10-CM | POA: Insufficient documentation

## 2015-04-20 DIAGNOSIS — Z8349 Family history of other endocrine, nutritional and metabolic diseases: Secondary | ICD-10-CM | POA: Insufficient documentation

## 2015-04-20 DIAGNOSIS — R011 Cardiac murmur, unspecified: Secondary | ICD-10-CM | POA: Diagnosis not present

## 2015-04-20 HISTORY — PX: PACEMAKER INSERTION: SHX728

## 2015-04-20 LAB — GLUCOSE, CAPILLARY
GLUCOSE-CAPILLARY: 132 mg/dL — AB (ref 65–99)
GLUCOSE-CAPILLARY: 93 mg/dL (ref 65–99)

## 2015-04-20 SURGERY — INSERTION, CARDIAC PACEMAKER
Anesthesia: Monitor Anesthesia Care | Site: Chest | Wound class: Clean

## 2015-04-20 MED ORDER — ONDANSETRON HCL 4 MG/2ML IJ SOLN: 4.0000 mg | Freq: Four times a day (QID) | INTRAMUSCULAR | Status: DC | PRN

## 2015-04-20 MED ORDER — CEFAZOLIN SODIUM 1-5 GM-% IV SOLN
1.0000 g | Freq: Four times a day (QID) | INTRAVENOUS | Status: AC
  Administered 2015-04-20 – 2015-04-21 (×3): 1 g via INTRAVENOUS
  Filled 2015-04-20 (×3): qty 50

## 2015-04-20 MED ORDER — SODIUM CHLORIDE 0.9 % IR SOLN
Freq: Once | Status: DC
  Filled 2015-04-20: qty 2

## 2015-04-20 MED ORDER — MELOXICAM 7.5 MG PO TABS
7.5000 mg | ORAL_TABLET | Freq: Every day | ORAL | Status: DC
  Administered 2015-04-21: 7.5 mg via ORAL
  Filled 2015-04-20: qty 1

## 2015-04-20 MED ORDER — TRAMADOL HCL 50 MG PO TABS: 50.0000 mg | ORAL_TABLET | Freq: Four times a day (QID) | ORAL | Status: DC | PRN

## 2015-04-20 MED ORDER — OXYCODONE HCL 5 MG PO TABS: 5.0000 mg | ORAL_TABLET | Freq: Once | ORAL | Status: DC | PRN

## 2015-04-20 MED ORDER — FENTANYL CITRATE (PF) 100 MCG/2ML IJ SOLN
INTRAMUSCULAR | Status: DC | PRN
  Administered 2015-04-20: 50 ug via INTRAVENOUS

## 2015-04-20 MED ORDER — GABAPENTIN 300 MG PO CAPS
300.0000 mg | ORAL_CAPSULE | Freq: Two times a day (BID) | ORAL | Status: DC
  Administered 2015-04-20 – 2015-04-21 (×2): 300 mg via ORAL
  Filled 2015-04-20 (×2): qty 1

## 2015-04-20 MED ORDER — FINASTERIDE 5 MG PO TABS
5.0000 mg | ORAL_TABLET | Freq: Every day | ORAL | Status: DC
  Administered 2015-04-21: 5 mg via ORAL
  Filled 2015-04-20: qty 1

## 2015-04-20 MED ORDER — LIDOCAINE 1 % OPTIME INJ - NO CHARGE
INTRAMUSCULAR | Status: DC | PRN
  Administered 2015-04-20: 27 mL

## 2015-04-20 MED ORDER — TAMSULOSIN HCL 0.4 MG PO CAPS
0.4000 mg | ORAL_CAPSULE | Freq: Every day | ORAL | Status: DC
  Administered 2015-04-21: 0.4 mg via ORAL
  Filled 2015-04-20: qty 1

## 2015-04-20 MED ORDER — METHIMAZOLE 5 MG PO TABS
2.5000 mg | ORAL_TABLET | Freq: Every day | ORAL | Status: DC
  Administered 2015-04-21: 2.5 mg via ORAL
  Filled 2015-04-20: qty 1

## 2015-04-20 MED ORDER — SODIUM CHLORIDE 0.9 % IV SOLN
INTRAVENOUS | Status: DC
  Administered 2015-04-20: 11:00:00 via INTRAVENOUS

## 2015-04-20 MED ORDER — ACETAMINOPHEN 325 MG PO TABS: 325.0000 mg | ORAL_TABLET | ORAL | Status: DC | PRN

## 2015-04-20 MED ORDER — PROPOFOL 500 MG/50ML IV EMUL
INTRAVENOUS | Status: DC | PRN
  Administered 2015-04-20: 25 ug/kg/min via INTRAVENOUS

## 2015-04-20 MED ORDER — LISINOPRIL 40 MG PO TABS
40.0000 mg | ORAL_TABLET | Freq: Every day | ORAL | Status: DC
Start: 2015-04-21 — End: 2015-04-21
  Administered 2015-04-21: 40 mg via ORAL
  Filled 2015-04-20: qty 1

## 2015-04-20 MED ORDER — SODIUM CHLORIDE 0.9 % IR SOLN
Status: DC | PRN
  Administered 2015-04-20: 120 mL

## 2015-04-20 MED ORDER — SODIUM CHLORIDE 0.9 % IV SOLN
INTRAVENOUS | Status: DC | PRN
  Administered 2015-04-20: 12:00:00 via INTRAVENOUS

## 2015-04-20 MED ORDER — LEVOTHYROXINE SODIUM 175 MCG PO TABS
175.0000 ug | ORAL_TABLET | Freq: Every day | ORAL | Status: DC
  Administered 2015-04-21: 175 ug via ORAL
  Filled 2015-04-20: qty 1

## 2015-04-20 MED ORDER — MIDAZOLAM HCL 2 MG/2ML IJ SOLN
INTRAMUSCULAR | Status: DC | PRN
  Administered 2015-04-20: 1 mg via INTRAVENOUS
  Administered 2015-04-20: 0.5 mg via INTRAVENOUS

## 2015-04-20 MED ORDER — FENTANYL CITRATE (PF) 100 MCG/2ML IJ SOLN: 25.0000 ug | INTRAMUSCULAR | Status: DC | PRN

## 2015-04-20 MED ORDER — INFLUENZA VAC SPLIT QUAD 0.5 ML IM SUSY
0.5000 mL | PREFILLED_SYRINGE | INTRAMUSCULAR | Status: AC
  Administered 2015-04-21: 0.5 mL via INTRAMUSCULAR
  Filled 2015-04-20: qty 0.5

## 2015-04-20 MED ORDER — OXYCODONE HCL 5 MG/5ML PO SOLN: 5.0000 mg | Freq: Once | ORAL | Status: DC | PRN

## 2015-04-20 MED ORDER — CEFAZOLIN SODIUM 1-5 GM-% IV SOLN
1.0000 g | Freq: Once | INTRAVENOUS | Status: AC
  Administered 2015-04-20: 1 g via INTRAVENOUS

## 2015-04-20 SURGICAL SUPPLY — 34 items
BAG DECANTER FOR FLEXI CONT (MISCELLANEOUS) ×3 IMPLANT
BRUSH SCRUB 4% CHG (MISCELLANEOUS) ×3 IMPLANT
CABLE SURG 12 DISP A/V CHANNEL (MISCELLANEOUS) ×3 IMPLANT
CANISTER SUCT 1200ML W/VALVE (MISCELLANEOUS) ×3 IMPLANT
CHLORAPREP W/TINT 26ML (MISCELLANEOUS) ×3 IMPLANT
COVER LIGHT HANDLE STERIS (MISCELLANEOUS) ×6 IMPLANT
COVER MAYO STAND STRL (DRAPES) ×3 IMPLANT
DRAPE C-ARM XRAY 36X54 (DRAPES) ×3 IMPLANT
DRESSING TELFA 4X3 1S ST N-ADH (GAUZE/BANDAGES/DRESSINGS) ×3 IMPLANT
DRSG TEGADERM 4X4.75 (GAUZE/BANDAGES/DRESSINGS) ×3 IMPLANT
GLOVE BIO SURGEON STRL SZ7.5 (GLOVE) ×3 IMPLANT
GLOVE BIO SURGEON STRL SZ8 (GLOVE) ×3 IMPLANT
GOWN STRL REUS W/ TWL LRG LVL3 (GOWN DISPOSABLE) ×1 IMPLANT
GOWN STRL REUS W/ TWL XL LVL3 (GOWN DISPOSABLE) ×1 IMPLANT
GOWN STRL REUS W/TWL LRG LVL3 (GOWN DISPOSABLE) ×2
GOWN STRL REUS W/TWL XL LVL3 (GOWN DISPOSABLE) ×2
IMMOBILIZER SHDR MD LX WHT (SOFTGOODS) IMPLANT
IMMOBILIZER SHDR XL LX WHT (SOFTGOODS) IMPLANT
INTRO PACEMAKR LEAD 9FR 13CM (INTRODUCER) ×3
INTRO PACEMKR SHEATH II 7FR (MISCELLANEOUS) ×3
INTRODUCER PACEMKR LD 9FR 13CM (INTRODUCER) ×1 IMPLANT
INTRODUCER PACEMKR SHTH II 7FR (MISCELLANEOUS) ×1 IMPLANT
IV NS 500ML (IV SOLUTION) ×2
IV NS 500ML BAXH (IV SOLUTION) ×1 IMPLANT
KIT RM TURNOVER STRD PROC AR (KITS) ×3 IMPLANT
LABEL OR SOLS (LABEL) ×3 IMPLANT
LEAD CAPSURE NOVUS 5076-52CM (Lead) ×3 IMPLANT
LEAD CAPSURE NOVUS 5076-58CM (Lead) ×3 IMPLANT
MARKER SKIN W/RULER 31145785 (MISCELLANEOUS) ×3 IMPLANT
PACK PACE INSERTION (MISCELLANEOUS) ×3 IMPLANT
PAD GROUND ADULT SPLIT (MISCELLANEOUS) ×3 IMPLANT
PAD STATPAD (MISCELLANEOUS) ×3 IMPLANT
PPM ADVISA MRI DR A2DR01 (Pacemaker) ×3 IMPLANT
SUT SILK 0 SH 30 (SUTURE) ×9 IMPLANT

## 2015-04-20 NOTE — Progress Notes (Signed)
Left shoulder immobilizer intact from OR.

## 2015-04-20 NOTE — Transfer of Care (Signed)
Immediate Anesthesia Transfer of Care Note  Patient: Todd Ortega  Procedure(s) Performed: Procedure(s): INSERTION PACEMAKER (N/A)  Patient Location: PACU  Anesthesia Type:General  Level of Consciousness: awake, alert  and oriented  Airway & Oxygen Therapy: Patient Spontanous Breathing and Patient connected to face mask oxygen  Post-op Assessment: Report given to RN and Post -op Vital signs reviewed and stable  Post vital signs: Reviewed and stable  Last Vitals: V paced 62 BP 128/68 Spo2: [id number]  Complications: No apparent anesthesia complications

## 2015-04-20 NOTE — H&P (Signed)
Printout Information Document Contents Initial consult Document Received Date 04/19/2015 Document Source Organization [facility] Patient Demographics  Reason for Referral  Reason for Visit  Encounter Details  Last Filed Vital Signs (In this Visit)  Progress Notes  Plan of Care  Imaging Results  Visit Diagnoses  Discontinued Medications  Historical Medications  Document Information Encounter Summary - Todd Ortega [id number] 80 y.o. Male)As of Nov. 16, 2016 Patient Demographics Patient Address Communication Language Race / Ethnicity  [address] [place], [place] [address]  [phone] [facility]) [phone] (Mobile)  English (Preferred) Black or African American / Not Hispanic/Latino  Reason for Referral Incoming Referral Reason Specialty Diagnoses / Procedures Referred By Contact Referred To Contact  Consultation (Routine)   Cardiovascular Disease / Cardiology Diagnoses  New Pt (last seen at [facility]) per Dr. Miguel Aschoff, Bradycardia, SOB, [employer]  Procedures  PR UNLISTED E/M SERVICE  NEW PATIENT  Dwaine Gale, MD  [address] [place], [place] [address]  Phone: [phone]  Fax: [phone]  Jobe Gibbon, MD  [place], [place] [address]  Phone: [phone]  Fax: [phone]    Outgoing Referral Reason Specialty Diagnoses / Procedures Referred By Contact Referred To Contact  Procedure (Routine)  Pending Review    Diagnoses  CHB (complete heart block)  Murmur  Pre-operative examination  Procedures  Echocardiogram 2D complete  Jobe Gibbon, MD  [place], [place] [address]  Phone: [phone]  Fax: [phone]     Reason for Visit Reason Comments  Follow-up NEW PT PER DR GILBERT  Shortness of Breath WITH EXERTION  Arrhythmia BRADYCARDIA  Encounter Details Date Type Department Care Team Description   04/18/2015 Initial consult [facility]  [place], [place] [address]  [phone]  Jobe Gibbon, [place]  [place], [place] [address]  [phone]  [phone] (Fax)  CHB (complete heart block) (Primary Dx);Murmur;Pre-operative examination;Essential hypertension;Thyroid disease;Other hyperlipidemia;Benign prostatic hyperplasia, presence of lower urinary tract symptoms unspecified, unspecified morphology  Last Filed Vital Signs (In this Visit) Vital Sign Reading Time Taken  Blood Pressure 130/62 04/18/2015 3:58 PM EST  Pulse 44 04/18/2015 3:58 PM EST  Temperature - -  Respiratory Rate - -  Oxygen Saturation - -  Weight 84.4 kg (186 lb) 04/18/2015 3:58 PM EST  Height 167.6 cm (5\' 6" ) 04/18/2015 3:58 PM EST  Body Mass Index 30.02 04/18/2015 3:58 PM EST  Progress Notes Jobe Gibbon, MD - 04/18/2015 3:30 PM EST Formatting of this note may be different from the original. New Patient Visit   Chief Complaint: Chief Complaint  Patient presents with  . Follow-up  NEW PT PER DR GILBERT  . Shortness of Breath  WITH EXERTION  . Arrhythmia  BRADYCARDIA  Date of Service: 04/18/2015 Date of Birth: December 18, 1934 PCP: Eulas Post, MD  History of Present Illness: Todd Ortega is a 80 y.o.male patient who referred by Dr. Rosanna Randy for bradycardia shortness of breath dyspnea on exertion. Patient was found have heart rate in the 40s he has history of hypertension hyperlipidemia thyroid disease. Patient complains of shortness of breath with minimal activity was seen in the office by primary physician EKG was done which suggested complete heart block so he was referred to Cardiology for further evaluation. The patient is not on any medications that would interfere with his heart rate. Thyroid disease appears to be  reasonably control with a therapy with tsh. Patient denies any blackout spells of  syncope no worsening chest pain but does have shortness of breath dyspnea on exertion so presented for evaluation. EKG suggested complete heart block heart rates in the 40s.  Past Medical and Surgical History  Past Medical History Past Medical History  Diagnosis Date  . Arthritis  . BPH (benign prostatic hypertrophy)  . CAD (coronary artery disease)  . Diabetes mellitus type 2, uncomplicated  . History of chicken pox  . History of mumps  . Hyperlipidemia  . Hypertension  . Neoplasm, adrenal gland  . Nephrolithiasis  . Neuropathy  . Sleep apnea   Past Surgical History He has a past surgical history that includes Hernia repair and Mass Removed Left Hand.   Medications and Allergies  Current Medications Current Outpatient Prescriptions  Medication Sig Dispense Refill  . aspirin 81 MG EC tablet Take 81 mg by mouth once daily.  Marland Kitchen CALCIUM CARB/VIT D3/MINERALS (CALCIUM-VITAMIN D ORAL) Take 1 tablet by mouth once daily.  . cholecalciferol (VITAMIN D3) 2,000 unit capsule Take 2,000 Units by mouth once daily.  Marland Kitchen ezetimibe (ZETIA) 10 mg tablet Take 10 mg by mouth once daily.  . finasteride (PROSCAR) 5 mg tablet Take 1 tablet by mouth once daily.  Marland Kitchen gabapentin (NEURONTIN) 300 MG capsule Take 1 capsule (300 mg total) by mouth 2 (two) times daily. 180 capsule 0  . levothyroxine (SYNTHROID, LEVOTHROID) 175 MCG tablet Take 175 mcg by mouth once daily. Take on an empty stomach with a glass of water at least 30-60 minutes before breakfast.  . lisinopril (PRINIVIL,ZESTRIL) 40 MG tablet Take 1 tablet by mouth once daily.  . meloxicam (MOBIC) 7.5 MG tablet Take 7.5 mg by mouth once daily.  . methimazole (TAPAZOLE) 5 MG tablet TAKE 0.5 TABLETS (2.5 MG TOTAL) BY MOUTH ONCE DAILY. 90 tablet 1  . omega-3 fatty acids/fish oil 340-1,000 mg capsule Take 1 capsule by mouth 2 (two) times daily.  . simvastatin (ZOCOR) 40 MG tablet Take 1 tablet by mouth once daily.  . tamsulosin (FLOMAX) 0.4 mg capsule Take  0.4 mg by mouth once daily. Take 30 minutes after same meal each day.  . traMADol (ULTRAM) 50 mg tablet Take 50 mg by mouth every 6 (six) hours as needed for Pain.   No current facility-administered medications for this visit.   Allergies Review of patient's allergies indicates no known allergies.  Social and Family History  Social History reports that he has never smoked. He does not have any smokeless tobacco history on file. He reports that he does not drink alcohol or use illicit drugs.  Family History family history includes Cancer in his father; Diabetes type II in his mother; Gout in his mother; Heart attack in his mother; Hyperlipidemia in his mother; Hypertension in his mother; Lung cancer in his father.   Review of Systems   Review of Systems: The patient denies chest pain, shortness of breath, orthopnea, paroxysmal nocturnal dyspnea, pedal edema, palpitations, heart racing, presyncope, syncope. Review of 12 Systems is negative except as described above.  Physical Examination   Vitals: Visit Vitals  . BP 130/62  . Pulse (!) 44  . Ht 167.6 cm (5\' 6" )  . Wt 84.4 kg (186 lb)  . BMI 30.02 kg/m2   Ht:167.6 cm (5\' 6" ) Wt:84.4 kg (186 lb) [id number] surface area is 1.98 meters squared. Body mass index is 30.02 kg/(m^2).  HEENT: Pupils equally reactive to light and accomodation  Neck: Supple  without thyromegaly, carotid pulses 2+ Lungs: clear to auscultation bilaterally; no wheezes, rales, rhonchi Heart: Bradycardic rate and rhythm. No gallops, 2/6 sem murmurs or rub Abdomen: soft nontender, nondistended, with normal bowel sounds Extremities: no cyanosis, clubbing, or edema Peripheral Pulses: 2+ in all extremities, 2+ femoral pulses bilaterally  Assessment   80 y.o. male with  1. CHB (complete heart block)  2. Murmur  3. Pre-operative examination  4. Essential hypertension  5. Thyroid disease  6. Other hyperlipidemia  7. Benign prostatic hyperplasia, presence of lower  urinary tract symptoms unspecified, unspecified morphology   Plan  1 complete heart block and severe bradycardia dyspnea on exertion recommend permanent pacemaker placement within the next 48-72 hours 2 bradycardia heart rate in the 40s underlying rhythm is complete heart block recommend permanent pacemaker 3 hypertension reasonably controlled continue current care 4 thyroid disease continue levothyroxine last tsh in July was 1.2 5 dyspnea on exertion shortness of breath possibly related to bradycardia complete heart block chronotropic incompetence 6 hyperlipidemia currently on Zetia must not appear to tolerate a statin 7 shortness of breath dyspnea on exertion bradycardia with murmur recommend echocardiogram for evaluation 8 neuropathy unclear etiology currently on gabapentin 9 BPH being treated with tamsulosin 10 permanent pacemaker placement with AP this week as outpatient 11 outpatient follow-up for this month  Orders Placed This Encounter  Procedures  . Echocardiogram 2D complete   No Follow-up on file.  [name], MD    Plan of Care Upcoming Encounters Upcoming Encounters  Date Type Specialty Providers Description  04/20/2015 [facility] Orders Only     04/20/2015 On[facility] Orders Only     05/24/2015 Appointment Neurology Ray Church, MD  [place]  [facility]  [place], [place] [address]  [phone]  [phone] (Fax)    Imaging Results   Echocardiogram 2D complete (04/18/2015 5:00 PM) Echocardiogram 2D complete (04/18/2015 5:00 PM)  Component Value Ref Range  LV Ejection Fraction (%) 55   Aortic Valve Regurgitation Grade trivial   Aortic Valve Stenosis Grade none   Aortic Valve Max Velocity (m/s) 1.4 m/sec  Mitral Valve Stenosis Grade none   Mitral Valve Regurgitation Grade mild   Tricuspid Valve Regurgitation Grade mild   Tricuspid Valve Regurgitation Max Velocity (m/s) 3.8 m/sec  Right Ventricle  Systolic Pressure (mmHg) [id number] mmHg  LV End Diastolic Diameter (cm) 6.0 cm  LV End Systolic Diameter (cm) 4.2 cm  LV Septum Wall Thickness (cm) 1.1 cm  LV Posterior Wall Thickness (cm) 1.0 cm  Left Atrium Diameter (cm) 4.3 cm   Echocardiogram 2D complete (04/18/2015 5:00 PM)  Narrative  [facility]IOLOGY [facility] [id number]   [place] #: [id number]   [address], [place], [place] [address]Date: 04/18/2015 04:31 PM   Adult Male Age: 44 yrs  ECHOCARDIOGRAM REPORTOutpatient  STUDY:CHEST WALL TAPE:0000:00: 0:00:00 KC::KCWC   ECHO:Yes DOPPLER:YesFILE:0000-000-000 MD1:CALLWOOD, DWAYNE DENNIS  COLOR:YesCONTRAST:No MACHINE:Philips  RV BIOPSY:No 3D:NoSOUND QLTY:Moderate   MEDIUM:None  ___________________________________________________________________________________________     HISTORY:Murmur  REASON:Assess, LV function  INDICATION:R01.1 Murmur  ___________________________________________________________________________________________    ECHOCARDIOGRAPHIC MEASUREMENTS  2D DIMENSIONS  AORTA ValuesNormal RangeMAIN PAValuesNormal Range  Annulus:nm* [2.3 - 2.9]PA Main:nm* [1.5 - 2.1]  Aorta Sin:nm* [3.1 - 3.7] RIGHT VENTRICLE  ST Junction:nm* [2.6 - 3.2]RV Base:4.0 cm[ < 4.2]  Asc.Aorta:nm* [2.6 - 3.4] RV Mid:nm* [ < 3.5]  LEFT  VENTRICLERV Length:nm* [ < 8.6]  LVIDd:6.0 cm[4.2 - 5.9] INFERIOR VENA CAVA  LVIDs:4.2 cmMax. IVC:nm* [ <=  2.1]   FS:29.7 %[> 25]Min. IVC:nm*  SWT:1.1 cm[0.6 - 1.0] ------------------  PWT:1.0 cm[0.6 - 1.0] nm* - not measured  LEFT ATRIUM  LA Diam:4.3 cm[3.0 - 4.0]  LA A4C Area:nm* [ < 20]  LA Volume:nm* Roice.Felt - 58]  ___________________________________________________________________________________________    ECHOCARDIOGRAPHIC DESCRIPTIONS    AORTIC ROOT  Size:Normal  Dissection:INDETERM FOR DISSECTION    AORTIC VALVE  Leaflets:TricuspidMorphology:Normal  Mobility:Fully mobile    LEFT VENTRICLE  Size:Normal Anterior:Normal  Contraction:NormalLateral:Normal  Closest EF:>55% (Estimated) Septal:Normal   LV Masses:No MassesApical:Normal   [id number] LVH APICALInferior:Normal   Posterior:Normal  Dias.FxClass:N/A    MITRAL VALVE  Leaflets:Normal Mobility:Fully mobile  Morphology:Normal    LEFT ATRIUM  Size:MILDLY ENLARGED LA Masses:No masses   IA Septum:Normal IAS    MAIN PA  Size:Normal    PULMONIC VALVE  Morphology:Normal Mobility:Fully mobile    RIGHT VENTRICLE   RV Masses:No MassesSize:Normal   Free Wall:NormalContraction:Normal    TRICUSPID VALVE  Leaflets:Normal Mobility:Fully mobile  Morphology:Normal    RIGHT ATRIUM  Size:Normal RA  Other:None   RA Mass:No masses    PERICARDIUM   Fluid:No effusion    INFERIOR VENACAVA  Size:Normal Normal respiratory collapse    _____________________________________________________________________    DOPPLER ECHO and OTHER SPECIAL PROCEDURES   Aortic:TRIVIAL AR No AS  136.0 cm/sec peak vel7.4 mmHg peak grad     Mitral:MILD MRNo MS  MV Inflow E Vel=122.0 cm/sec MV Annulus E'Vel=4.0 cm/sec  E/E'Ratio=30.5    Tricuspid:MILD TRNo TS  381.0 cm/sec peak TR vel 63.1 mmHg peak RV pressure    Pulmonary:TRIVIAL PR No PS        ___________________________________________________________________________________________  INTERPRETATION  NORMAL LEFT VENTRICULAR SYSTOLIC FUNCTION  NORMAL RIGHT VENTRICULAR SYSTOLIC FUNCTION  MILD VALVULAR REGURGITATION (See above)  NO VALVULAR STENOSIS  MODERATE APICAL HYPERTROPHY  Normal left ventricular function ejection fraction greater than 55%      ___________________________________________________________________________________________  Electronically signed by: Lujean Amel, MD on 04/19/2015 09:32 AM  Performed By: Johnathan Hausen, RDCS, RVT  Ordering Physician: Lujean Amel    ___________________________________________________________________________________________    Echocardiogram 2D complete (04/18/2015 5:00 PM)  Procedure Note  Interface, Text Results In - 04/19/2015 9:33 AM EST  CARDIOLOGY DEPARTMENT [facility] [id number] [facility] Acct #: [id number] [address] [place], [place] [address] Date: 04/18/2015 04:31 PM Adult Male Age: 88 yrs ECHOCARDIOGRAM REPORT Outpatient STUDY:CHEST WALL TAPE:0000:00: 0:00:00 KC::KCWC ECHO:Yes DOPPLER:Yes FILE:0000-000-000 MD1:  CALLWOOD, DWAYNE DENNIS COLOR:Yes CONTRAST:No MACHINE:Philips RV BIOPSY:No 3D:No SOUND QLTY:Moderate MEDIUM:None ___________________________________________________________________________________________  HISTORY:Murmur REASON:Assess, LV function INDICATION:R01.1 Murmur ___________________________________________________________________________________________  ECHOCARDIOGRAPHIC MEASUREMENTS 2D DIMENSIONS AORTA Values Normal Range MAIN PA Values Normal Range Annulus: nm* [2.3 - 2.9] PA Main: nm* [1.5 - 2.1] Aorta Sin: nm* [3.1 - 3.7] RIGHT VENTRICLE ST Junction: nm* [2.6 - 3.2] RV Base: 4.0 cm [ < 4.2] Asc.Aorta: nm* [2.6 - 3.4] RV Mid: nm* [ < 3.5] LEFT VENTRICLE RV Length: nm* [ < 8.6] LVIDd: 6.0 cm [4.2 - 5.9] INFERIOR VENA CAVA LVIDs: 4.2 cm Max. IVC: nm* [ <= 2.1] FS: 29.7 % [> 25] Min. IVC: nm* SWT: 1.1 cm [0.6 - 1.0] ------------------ PWT: 1.0 cm [0.6 - 1.0] nm* - not measured LEFT ATRIUM LA Diam: 4.3 cm [3.0 - 4.0] LA A4C Area: nm* [ < 20] LA Volume: nm* [18 - 58] ___________________________________________________________________________________________  ECHOCARDIOGRAPHIC DESCRIPTIONS  AORTIC ROOT Size:Normal Dissection:INDETERM FOR DISSECTION  AORTIC VALVE Leaflets:Tricuspid Morphology:Normal Mobility:Fully mobile  LEFT VENTRICLE Size:Normal Anterior:Normal Contraction:Normal Lateral:Normal Closest EF:>55% (Estimated) Septal:Normal LV Masses:No  Masses Apical:Normal [id number] LVH APICAL Inferior:Normal Posterior:Normal Dias.FxClass:N/A  MITRAL VALVE Leaflets:Normal Mobility:Fully mobile Morphology:Normal  LEFT ATRIUM Size:MILDLY ENLARGED LA Masses:No masses IA Septum:Normal IAS  MAIN PA Size:Normal  PULMONIC VALVE Morphology:Normal Mobility:Fully mobile  RIGHT VENTRICLE RV Masses:No Masses Size:Normal Free Wall:Normal Contraction:Normal  TRICUSPID VALVE Leaflets:Normal Mobility:Fully mobile Morphology:Normal  RIGHT  ATRIUM Size:Normal RA Other:None RA Mass:No masses  PERICARDIUM Fluid:No effusion  INFERIOR VENACAVA Size:Normal Normal respiratory collapse  _____________________________________________________________________  DOPPLER ECHO and OTHER SPECIAL PROCEDURES Aortic:TRIVIAL AR No AS 136.0 cm/sec peak vel 7.4 mmHg peak grad  Mitral:MILD MR No MS MV Inflow E Vel=122.0 cm/sec MV Annulus E'Vel=4.0 cm/sec E/E'Ratio=30.5  Tricuspid:MILD TR No TS 381.0 cm/sec peak TR vel 63.1 mmHg peak RV pressure  Pulmonary:TRIVIAL PR No PS    ___________________________________________________________________________________________ INTERPRETATION NORMAL LEFT VENTRICULAR SYSTOLIC FUNCTION NORMAL RIGHT VENTRICULAR SYSTOLIC FUNCTION MILD VALVULAR REGURGITATION (See above) NO VALVULAR STENOSIS MODERATE APICAL HYPERTROPHY Normal left ventricular function ejection fraction greater than 55%   ___________________________________________________________________________________________ Electronically signed by: Lujean Amel, MD on 04/19/2015 09:32 AM Performed By: Johnathan Hausen, RDCS, RVT Ordering Physician: Lujean Amel  ___________________________________________________________________________________________   Visit Diagnoses   CHB (complete heart block) - Primary Atrioventricular block, complete   Murmur Undiagnosed cardiac murmurs   Pre-operative examination Unspecified pre-operative examination   Essential hypertension   Thyroid disease Unspecified disorder of thyroid   Other hyperlipidemia   Benign prostatic hyperplasia, presence of lower urinary tract symptoms unspecified, unspecified morphology Discontinued Medications Prescription Sig. Discontinue Reason Start Date End Date  dutasteride (AVODART) 0.5 mg capsule  Take 0.5 mg by mouth once daily.   04/18/2015  Historical Medications - Added in This Encounter This list may reflect changes made after this  encounter.  Medication Sig. Disp. Refills Start Date End Date  traMADol (ULTRAM) 50 mg tablet  Take 50 mg by mouth every 6 (six) hours as needed for Pain.      meloxicam (MOBIC) 7.5 MG tablet  Take 7.5 mg by mouth once daily.      levothyroxine (SYNTHROID, LEVOTHROID) 175 MCG tablet  Take 175 mcg by mouth once daily. Take on an empty stomach with a glass of water at least 30-60 minutes before breakfast.      omega-3 fatty acids/fish oil 340-1,000 mg capsule  Take 1 capsule by mouth 2 (two) times daily.      Document Information Primary Care Provider Dwaine Gale (Apr. 07, 2015 - Present) [phone] (Work) [phone] (Fax)  [address] [place], [place] [address] Document Coverage Dates Nov. 15, 2016 - Nov. 15, 2016 [place] [phone] (Work) [place], [place] [address] Encounter Providers Dwayne Aida Raider (Attending) [phone] (Work) [phone] (Fax)  [place], [place] [address] Encounter Date Nov. 15, 2016 - Nov. 15, 2016 Printout Information Document Contents Initial consult Document Received Date 04/19/2015 Document Source Organization [facility] Patient Demographics  Reason for Referral  Reason for Visit  Encounter Details  Last Filed Vital Signs (In this Visit)  Progress Notes  Plan of Care  Imaging Results  Visit Diagnoses  Discontinued Medications  Historical Medications  Document Information Encounter Summary - Grandon Torsiello [id number] y.o. Male)As of Nov. 16, 2016 Patient Demographics Patient Address Communication Language Race / Ethnicity  [address] [place], [place] [address]  [phone] [facility]) [phone] (Mobile)  English (Preferred) Black or African American / Not Hispanic/Latino  Reason for Referral Incoming Referral Reason Specialty Diagnoses / Procedures Referred By Contact Referred To  Contact   Consultation (Routine)   Cardiovascular Disease / Cardiology Diagnoses  New Pt (last seen at [facility]) per Dr. Miguel Aschoff, Bradycardia, SOB, [employer]  Procedures  PR UNLISTED E/M SERVICE  NEW PATIENT  Dwaine Gale, MD  [address] [place], [place] [address]  Phone: [phone]  Fax: [phone]  Jobe Gibbon, MD  [place], [place] [address]  Phone: [phone]  Fax: [phone]    Outgoing Referral Reason Specialty Diagnoses / Procedures Referred By Contact Referred To Contact  Procedure (Routine)  Pending Review    Diagnoses  CHB (complete heart block)  Murmur  Pre-operative examination  Procedures  Echocardiogram 2D complete  Jobe Gibbon, MD  [place],  [address]  Phone: [phone]  Fax: [phone]     Reason for Visit Reason Comments  Follow-up NEW PT PER DR GILBERT  Shortness of Breath WITH EXERTION  Arrhythmia BRADYCARDIA  Encounter Details Date Type Department Care Team Description  04/18/2015 Initial consult [facility]  [place],  [address]  [phone]  Jobe Gibbon, [facility]  [place],  [address]  [phone]  [phone] (Fax)  CHB (complete heart block) (Primary Dx);Murmur;Pre-operative examination;Essential hypertension;Thyroid disease;Other hyperlipidemia;Benign prostatic hyperplasia, presence of lower urinary tract symptoms unspecified, unspecified morphology  Last Filed Vital Signs (In this Visit) Vital Sign Reading Time Taken  Blood Pressure 130/62 04/18/2015 3:58 PM EST  Pulse 44 04/18/2015 3:58 PM EST  Temperature - -  Respiratory Rate - -  Oxygen Saturation - -  Weight 84.4 kg (186 lb) 04/18/2015 3:58 PM EST  Height 167.6 cm (5\' 6" ) 04/18/2015 3:58 PM EST  Body Mass  Index 30.02 04/18/2015 3:58 PM EST  Progress Notes Jobe Gibbon, MD - 04/18/2015 3:30 PM EST Formatting of this note may be different from the original. New Patient Visit   Chief Complaint: Chief Complaint  Patient presents with  . Follow-up  NEW PT PER DR GILBERT  . Shortness of Breath  WITH EXERTION  . Arrhythmia  BRADYCARDIA  Date of Service: 04/18/2015 Date of Birth: Mar 17, 1935 PCP: Eulas Post, MD  History of Present Illness: Todd Ortega is a 80 y.o.male patient who referred by Dr. Rosanna Randy for bradycardia shortness of breath dyspnea on exertion. Patient was found have heart rate in the 40s he has history of hypertension hyperlipidemia thyroid disease. Patient complains of shortness of breath with minimal activity was seen in the office by primary physician EKG was done which suggested complete heart block so he was referred to Cardiology for further evaluation. The patient is not on any medications that would interfere with his heart rate. Thyroid disease appears to be reasonably control with a therapy with tsh. Patient denies any blackout spells of syncope no worsening chest pain but does have shortness of breath dyspnea on exertion so presented for evaluation. EKG suggested complete heart block heart rates in the 40s.  Past Medical and Surgical History  Past Medical History Past Medical History  Diagnosis Date  . Arthritis  . BPH (benign prostatic hypertrophy)  . CAD (coronary artery disease)  . Diabetes mellitus type 2, uncomplicated  . History of chicken pox  . History of mumps  . Hyperlipidemia  . Hypertension  . Neoplasm, adrenal gland  . Nephrolithiasis  . Neuropathy  . Sleep apnea   Past Surgical History He has  a past surgical history that includes Hernia repair and Mass Removed Left Hand.   Medications and Allergies  Current Medications Current Outpatient Prescriptions  Medication Sig Dispense Refill  . aspirin 81 MG EC tablet Take 81 mg by  mouth once daily.  Marland Kitchen CALCIUM CARB/VIT D3/MINERALS (CALCIUM-VITAMIN D ORAL) Take 1 tablet by mouth once daily.  . cholecalciferol (VITAMIN D3) 2,000 unit capsule Take 2,000 Units by mouth once daily.  Marland Kitchen ezetimibe (ZETIA) 10 mg tablet Take 10 mg by mouth once daily.  . finasteride (PROSCAR) 5 mg tablet Take 1 tablet by mouth once daily.  Marland Kitchen gabapentin (NEURONTIN) 300 MG capsule Take 1 capsule (300 mg total) by mouth 2 (two) times daily. 180 capsule 0  . levothyroxine (SYNTHROID, LEVOTHROID) 175 MCG tablet Take 175 mcg by mouth once daily. Take on an empty stomach with a glass of water at least 30-60 minutes before breakfast.  . lisinopril (PRINIVIL,ZESTRIL) 40 MG tablet Take 1 tablet by mouth once daily.  . meloxicam (MOBIC) 7.5 MG tablet Take 7.5 mg by mouth once daily.  . methimazole (TAPAZOLE) 5 MG tablet TAKE 0.5 TABLETS (2.5 MG TOTAL) BY MOUTH ONCE DAILY. 90 tablet 1  . omega-3 fatty acids/fish oil 340-1,000 mg capsule Take 1 capsule by mouth 2 (two) times daily.  . simvastatin (ZOCOR) 40 MG tablet Take 1 tablet by mouth once daily.  . tamsulosin (FLOMAX) 0.4 mg capsule Take 0.4 mg by mouth once daily. Take 30 minutes after same meal each day.  . traMADol (ULTRAM) 50 mg tablet Take 50 mg by mouth every 6 (six) hours as needed for Pain.   No current facility-administered medications for this visit.   Allergies Review of patient's allergies indicates no known allergies.  Social and Family History  Social History reports that he has never smoked. He does not have any smokeless tobacco history on file. He reports that he does not drink alcohol or use illicit drugs.  Family History family history includes Cancer in his father; Diabetes type II in his mother; Gout in his mother; Heart attack in his mother; Hyperlipidemia in his mother; Hypertension in his mother; Lung cancer in his father.   Review of Systems   Review of Systems: The patient denies chest pain, shortness of breath, orthopnea,  paroxysmal nocturnal dyspnea, pedal edema, palpitations, heart racing, presyncope, syncope. Review of 12 Systems is negative except as described above.  Physical Examination   Vitals: Visit Vitals  . BP 130/62  . Pulse (!) 44  . Ht 167.6 cm (5\' 6" )  . Wt 84.4 kg (186 lb)  . BMI 30.02 kg/m2   Ht:167.6 cm (5\' 6" ) Wt:84.4 kg (186 lb) [id number] surface area is 1.98 meters squared. Body mass index is 30.02 kg/(m^2).  HEENT: Pupils equally reactive to light and accomodation  Neck: Supple without thyromegaly, carotid pulses 2+ Lungs: clear to auscultation bilaterally; no wheezes, rales, rhonchi Heart: Bradycardic rate and rhythm. No gallops, 2/6 sem murmurs or rub Abdomen: soft nontender, nondistended, with normal bowel sounds Extremities: no cyanosis, clubbing, or edema Peripheral Pulses: 2+ in all extremities, 2+ femoral pulses bilaterally  Assessment   80 y.o. male with  1. CHB (complete heart block)  2. Murmur  3. Pre-operative examination  4. Essential hypertension  5. Thyroid disease  6. Other hyperlipidemia  7. Benign prostatic hyperplasia, presence of lower urinary tract symptoms unspecified, unspecified morphology   Plan  1 complete heart block and severe bradycardia dyspnea on exertion recommend permanent pacemaker placement within the next  48-72 hours 2 bradycardia heart rate in the 40s underlying rhythm is complete heart block recommend permanent pacemaker 3 hypertension reasonably controlled continue current care 4 thyroid disease continue levothyroxine last tsh in July was 1.2 5 dyspnea on exertion shortness of breath possibly related to bradycardia complete heart block chronotropic incompetence 6 hyperlipidemia currently on Zetia must not appear to tolerate a statin 7 shortness of breath dyspnea on exertion bradycardia with murmur recommend echocardiogram for evaluation 8 neuropathy unclear etiology currently on gabapentin 9 BPH being treated with tamsulosin 10  permanent pacemaker placement with AP this week as outpatient 11 outpatient follow-up for this month  Orders Placed This Encounter  Procedures  . Echocardiogram 2D complete   No Follow-up on file.  [name], MD    Plan of Care Upcoming Encounters Upcoming Encounters  Date Type Specialty Providers Description  04/20/2015 OnBase Orders Only     04/20/2015 OnBase Orders Only     05/24/2015 Appointment Neurology Ray Church, MD  [facility]  [place], [place] [address]  [phone]  [phone] (Fax)    Imaging Results   Echocardiogram 2D complete (04/18/2015 5:00 PM) Echocardiogram 2D complete (04/18/2015 5:00 PM)  Component Value Ref Range  LV Ejection Fraction (%) 55   Aortic Valve Regurgitation Grade trivial   Aortic Valve Stenosis Grade none   Aortic Valve Max Velocity (m/s) 1.4 m/sec  Mitral Valve Stenosis Grade none   Mitral Valve Regurgitation Grade mild   Tricuspid Valve Regurgitation Grade mild   Tricuspid Valve Regurgitation Max Velocity (m/s) 3.8 m/sec  Right Ventricle Systolic Pressure (mmHg) [id number] mmHg  LV End Diastolic Diameter (cm) 6.0 cm  LV End Systolic Diameter (cm) 4.2 cm  LV Septum Wall Thickness (cm) 1.1 cm  LV Posterior Wall Thickness (cm) 1.0 cm  Left Atrium Diameter (cm) 4.3 cm   Echocardiogram 2D complete (04/18/2015 5:00 PM)  Narrative  [facility] [id number]   [place] #: [id number]   [address], [place], [place] [address]Date: 04/18/2015 04:31 PM   Adult Male Age: 59 yrs  ECHOCARDIOGRAM REPORTOutpatient  STUDY:CHEST WALL TAPE:0000:00:  0:00:00 KC::KCWC   ECHO:Yes DOPPLER:YesFILE:0000-000-000 MD1:CALLWOOD, DWAYNE DENNIS  COLOR:YesCONTRAST:No MACHINE:Philips  RV BIOPSY:No 3D:NoSOUND QLTY:Moderate   MEDIUM:None  ___________________________________________________________________________________________     HISTORY:Murmur  REASON:Assess, LV function  INDICATION:R01.1 Murmur  ___________________________________________________________________________________________    ECHOCARDIOGRAPHIC MEASUREMENTS  2D DIMENSIONS  AORTA ValuesNormal RangeMAIN PAValuesNormal Range  Annulus:nm* [2.3 - 2.9]PA Main:nm* [1.5 - 2.1]  Aorta Sin:nm* [3.1 - 3.7] RIGHT VENTRICLE  ST Junction:nm* [2.6 - 3.2]RV Base:4.0 cm[ < 4.2]  Asc.Aorta:nm* [2.6 - 3.4] RV Mid:nm* [ < 3.5]  LEFT VENTRICLERV Length:nm* [ < 8.6]  LVIDd:6.0 cm[4.2 - 5.9] INFERIOR VENA CAVA  LVIDs:4.2 cmMax. IVC:nm* [ <= 2.1]   FS:29.7 %[> 25]Min. IVC:nm*  SWT:1.1 cm[0.6 - 1.0] ------------------  PWT:1.0 cm[0.6 - 1.0] nm* - not measured  LEFT ATRIUM  LA Diam:4.3 cm[3.0 - 4.0]  LA A4C Area:nm* [ < 20]  LA Volume:nm* Roice.Felt - 58]  ___________________________________________________________________________________________    ECHOCARDIOGRAPHIC DESCRIPTIONS    AORTIC ROOT  Size:Normal  Dissection:INDETERM FOR DISSECTION    AORTIC VALVE    Leaflets:TricuspidMorphology:Normal  Mobility:Fully mobile    LEFT VENTRICLE  Size:Normal Anterior:Normal  Contraction:NormalLateral:Normal  Closest EF:>55% (Estimated) Septal:Normal   LV Masses:No MassesApical:Normal   [id number] LVH APICALInferior:Normal   Posterior:Normal  Dias.FxClass:N/A    MITRAL VALVE  Leaflets:Normal Mobility:Fully mobile  Morphology:Normal    LEFT  ATRIUM  Size:MILDLY ENLARGED LA Masses:No masses   IA Septum:Normal IAS    MAIN PA  Size:Normal    PULMONIC VALVE  Morphology:Normal Mobility:Fully mobile    RIGHT VENTRICLE   RV Masses:No MassesSize:Normal   Free Wall:NormalContraction:Normal    TRICUSPID VALVE  Leaflets:Normal Mobility:Fully mobile  Morphology:Normal    RIGHT ATRIUM  Size:Normal RA Other:None   RA Mass:No masses    PERICARDIUM   Fluid:No effusion    INFERIOR VENACAVA  Size:Normal Normal respiratory collapse    _____________________________________________________________________    DOPPLER ECHO and OTHER SPECIAL PROCEDURES   Aortic:TRIVIAL AR No AS  136.0 cm/sec peak vel7.4 mmHg peak grad     Mitral:MILD MRNo MS  MV Inflow E Vel=122.0 cm/sec MV Annulus E'Vel=4.0 cm/sec  E/E'Ratio=30.5    Tricuspid:MILD TRNo TS  381.0 cm/sec peak TR vel 63.1 mmHg peak RV pressure    Pulmonary:TRIVIAL PR No PS        ___________________________________________________________________________________________  INTERPRETATION   NORMAL LEFT VENTRICULAR SYSTOLIC FUNCTION  NORMAL RIGHT VENTRICULAR SYSTOLIC FUNCTION  MILD VALVULAR REGURGITATION (See above)  NO VALVULAR STENOSIS  MODERATE APICAL HYPERTROPHY  Normal left ventricular function ejection fraction greater than 55%      ___________________________________________________________________________________________  Electronically signed by: Lujean Amel, MD on 04/19/2015 09:32 AM  Performed By: Johnathan Hausen, RDCS, RVT  Ordering Physician: Lujean Amel    ___________________________________________________________________________________________    Echocardiogram 2D complete (04/18/2015 5:00 PM)  Procedure Note  Interface, Text Results In - 04/19/2015 9:33 AM EST  CARDIOLOGY DEPARTMENT [facility] [id number] [facility] Acct #: [id number] [address] [place], [place] [address] Date: 04/18/2015 04:31 PM Adult Male Age: 45 yrs ECHOCARDIOGRAM REPORT Outpatient STUDY:CHEST WALL TAPE:0000:00: 0:00:00 KC::KCWC ECHO:Yes DOPPLER:Yes FILE:0000-000-000 MD1: CALLWOOD, DWAYNE DENNIS COLOR:Yes CONTRAST:No MACHINE:Philips RV BIOPSY:No 3D:No SOUND QLTY:Moderate MEDIUM:None ___________________________________________________________________________________________  HISTORY:Murmur REASON:Assess, LV function INDICATION:R01.1 Murmur ___________________________________________________________________________________________  ECHOCARDIOGRAPHIC MEASUREMENTS 2D DIMENSIONS AORTA Values Normal Range MAIN PA Values Normal Range Annulus: nm* [2.3 - 2.9] PA Main: nm* [1.5 - 2.1] Aorta Sin: nm* [3.1 - 3.7] RIGHT VENTRICLE ST Junction: nm* [2.6 - 3.2] RV Base: 4.0 cm [ < 4.2] Asc.Aorta: nm* [2.6 - 3.4] RV Mid: nm* [ < 3.5] LEFT VENTRICLE RV Length: nm* [ < 8.6] LVIDd: 6.0 cm [4.2 - 5.9] INFERIOR VENA CAVA LVIDs: 4.2 cm Max. IVC: nm* [ <= 2.1] FS: 29.7 % [> 25] Min. IVC: nm* SWT: 1.1 cm [0.6 -  1.0] ------------------ PWT: 1.0 cm [0.6 - 1.0] nm* - not measured LEFT ATRIUM LA Diam: 4.3 cm [3.0 - 4.0] LA A4C Area: nm* [ < 20] LA Volume: nm* [18 - 58] ___________________________________________________________________________________________  ECHOCARDIOGRAPHIC DESCRIPTIONS  AORTIC ROOT Size:Normal Dissection:INDETERM FOR DISSECTION  AORTIC VALVE Leaflets:Tricuspid Morphology:Normal Mobility:Fully mobile  LEFT VENTRICLE Size:Normal Anterior:Normal Contraction:Normal Lateral:Normal Closest EF:>55% (Estimated) Septal:Normal LV Masses:No Masses Apical:Normal [id number] LVH APICAL Inferior:Normal Posterior:Normal Dias.FxClass:N/A  MITRAL VALVE Leaflets:Normal Mobility:Fully mobile Morphology:Normal  LEFT ATRIUM Size:MILDLY ENLARGED LA Masses:No masses IA Septum:Normal IAS  MAIN PA Size:Normal  PULMONIC VALVE Morphology:Normal Mobility:Fully mobile  RIGHT VENTRICLE RV Masses:No Masses Size:Normal Free Wall:Normal Contraction:Normal  TRICUSPID VALVE Leaflets:Normal Mobility:Fully mobile Morphology:Normal  RIGHT ATRIUM Size:Normal RA Other:None RA Mass:No masses  PERICARDIUM Fluid:No effusion  INFERIOR VENACAVA Size:Normal Normal respiratory collapse  _____________________________________________________________________  DOPPLER ECHO and OTHER SPECIAL PROCEDURES Aortic:TRIVIAL AR No AS 136.0 cm/sec peak vel 7.4 mmHg peak grad  Mitral:MILD MR No MS MV Inflow E Vel=122.0 cm/sec MV Annulus E'Vel=4.0 cm/sec E/E'Ratio=30.5  Tricuspid:MILD TR No TS 381.0 cm/sec peak  TR vel 63.1 mmHg peak RV pressure  Pulmonary:TRIVIAL PR No PS    ___________________________________________________________________________________________ INTERPRETATION NORMAL LEFT VENTRICULAR SYSTOLIC FUNCTION NORMAL RIGHT VENTRICULAR SYSTOLIC FUNCTION MILD VALVULAR REGURGITATION (See above) NO VALVULAR STENOSIS MODERATE APICAL HYPERTROPHY Normal left ventricular function  ejection fraction greater than 55%   ___________________________________________________________________________________________ Electronically signed by: Lujean Amel, MD on 04/19/2015 09:32 AM Performed By: Johnathan Hausen, [place], RVT Ordering Physician: Lujean Amel  ___________________________________________________________________________________________   Visit Diagnoses   CHB (complete heart block) - Primary Atrioventricular block, complete   Murmur Undiagnosed cardiac murmurs   Pre-operative examination Unspecified pre-operative examination   Essential hypertension   Thyroid disease Unspecified disorder of thyroid   Other hyperlipidemia   Benign prostatic hyperplasia, presence of lower urinary tract symptoms unspecified, unspecified morphology Discontinued Medications Prescription Sig. Discontinue Reason Start Date End Date  dutasteride (AVODART) 0.5 mg capsule  Take 0.5 mg by mouth once daily.   04/18/2015  Historical Medications - Added in This Encounter This list may reflect changes made after this encounter.  Medication Sig. Disp. Refills Start Date End Date  traMADol (ULTRAM) 50 mg tablet  Take 50 mg by mouth every 6 (six) hours as needed for Pain.      meloxicam (MOBIC) 7.5 MG tablet  Take 7.5 mg by mouth once daily.      levothyroxine (SYNTHROID, LEVOTHROID) 175 MCG tablet  Take 175 mcg by mouth once daily. Take on an empty stomach with a glass of water at least 30-60 minutes before breakfast.      omega-3 fatty acids/fish oil 340-1,000 mg capsule  Take 1 capsule by mouth 2 (two) times daily.      Document Information Primary Care Provider Dwaine Gale (Apr. 07, 2015 - Present) [phone] (Work) [phone] (Fax)  [address] [place], [place] [address] Document Coverage Dates Nov. 15, 2016 - Nov. 15, 2016 [place] [phone] (Work) [place], [place]  [address] Encounter Providers Dwayne Aida Raider (Attending) [phone] (Work) [phone] (Fax)  [place], [place] [address] Encounter Date Nov. 15, 2016 - Nov. 15, 2016

## 2015-04-20 NOTE — Op Note (Signed)
[facility]   04/20/2015                     1:33 PM  PATIENT:  Todd Ortega    PRE-OPERATIVE DIAGNOSIS:  COMPLETE HEART BLOCK  POST-OPERATIVE DIAGNOSIS:  Same  PROCEDURE:  INSERTION PACEMAKER  SURGEON:  Asyah Candler, MD    ANESTHESIA:     PREOPERATIVE INDICATIONS:  Todd Ortega is a  80 y.o. male with a diagnosis of [place] who failed conservative measures and elected for surgical management.    The risks benefits and alternatives were discussed with the patient preoperatively including but not limited to the risks of infection, bleeding, cardiopulmonary complications, the need for revision surgery, among others, and the patient was willing to proceed.   OPERATIVE PROCEDURE: The left pectoral region was prepped and draped in the usual sterile manner. Local anesthesia was obtained with 1% lidocaine. A 6 cm incision was performed to the left pectoral region. The pacemaker pocket was generated by electrocautery and blunt dissection. Access was obtained to the left subclavian vein by fine needle aspiration. MRI compatible leads were positioned into the right ventricular apical septum and right atrial appendage under fluoroscopic guidance. After proper thresholds were obtained, the lead was sutured in place. The pacemaker pocket was irrigated with gentamicin solution. The leads were connected to a MRI compatible dual-chamber rate responsive pacemaker generator ( Medtronic Advisa DR MRI ), and positioned into the pocket. The pocket was closed with 2-0 and 4-0 Vicryl, respectively. Steri-Strips and a pressure dressing were applied. There were no procedural complications.

## 2015-04-20 NOTE — Anesthesia Procedure Notes (Signed)
Performed by: Fleming Prill Pre-anesthesia Checklist: Patient identified, Emergency Drugs available, Suction available, Patient being monitored and Timeout performed Patient Re-evaluated:Patient Re-evaluated prior to inductionOxygen Delivery Method: Simple face mask Intubation Type: IV induction       

## 2015-04-20 NOTE — Anesthesia Postprocedure Evaluation (Signed)
  Anesthesia Post-op Note  Patient: Todd Ortega  Procedure(s) Performed: Procedure(s): INSERTION PACEMAKER (N/A)  Anesthesia type:General, MAC  Patient location: PACU  Post pain: Pain level controlled  Post assessment: Post-op Vital signs reviewed, Patient's Cardiovascular Status Stable, Respiratory Function Stable, Patent Airway and No signs of Nausea or vomiting  Post vital signs: Reviewed and stable  Last Vitals:  Filed Vitals:   04/20/15 1718  BP: 158/81  Pulse: 65  Temp: 36.4 C  Resp: 18    Level of consciousness: awake, alert  and patient cooperative  Complications: No apparent anesthesia complications

## 2015-04-20 NOTE — Interval H&P Note (Signed)
History and Physical Interval Note:  04/20/2015 9:06 AM  Todd Ortega  has presented today for surgery, with the diagnosis of COMPLETE HEART BLOCK  The various methods of treatment have been discussed with the patient and family. After consideration of risks, benefits and other options for treatment, the patient has consented to  Procedure(s): INSERTION PACEMAKER (N/A) as a surgical intervention .  The patient's history has been reviewed, patient examined, no change in status, stable for surgery.  I have reviewed the patient's chart and labs.  Questions were answered to the patient's satisfaction.     [name]

## 2015-04-20 NOTE — Anesthesia Preprocedure Evaluation (Addendum)
Anesthesia Evaluation  Patient identified by MRN, date of birth, ID band Patient awake    Reviewed: Allergy & Precautions, H&P , NPO status , Patient's Chart, lab work & pertinent test results  History of Anesthesia Complications Negative for: history of anesthetic complications  Airway Mallampati: II  TM Distance: >3 FB Neck ROM: full    Dental  (+) Poor Dentition, Missing   Pulmonary shortness of breath, sleep apnea ,    Pulmonary exam normal breath sounds clear to auscultation       Cardiovascular Exercise Tolerance: Good hypertension, (-) angina+ CAD  (-) DOE Normal cardiovascular exam+ dysrhythmias  Rhythm:regular Rate:Normal     Neuro/Psych negative neurological ROS  negative psych ROS   GI/Hepatic negative GI ROS, Neg liver ROS,   Endo/Other  diabetes, Type 2Hyperthyroidism   Renal/GU Renal disease  negative genitourinary   Musculoskeletal  (+) Arthritis ,   Abdominal   Peds  Hematology negative hematology ROS (+)   Anesthesia Other Findings Past Medical History:   Hyperthyroidism                                              Coronary artery disease                                      Sleep apnea                                                  Shortness of breath dyspnea                                  Diabetes mellitus without complication (HCC)                 Chronic kidney disease                                       Arthritis                                                    BPH (benign prostatic hypertrophy)                           Hyperlipidemia                                               Hypertension                                                 Neuropathy ([place])  Nephrolithiasis                                              Complete heart block (HCC)                                  Past Surgical History:   CARDIAC CATHETERIZATION                                        MASS EXCISION                                                   Comment:removed from left hand   HERNIA REPAIR                                   Bilateral                Comment:Inguinal Hernia Repair     Reproductive/Obstetrics negative OB ROS                            Anesthesia Physical Anesthesia Plan  ASA: IV  Anesthesia Plan: General and MAC   Post-op Pain Management:    Induction:   Airway Management Planned:   Additional Equipment:   Intra-op Plan:   Post-operative Plan:   Informed Consent: I have reviewed the patients History and Physical, chart, labs and discussed the procedure including the risks, benefits and alternatives for the proposed anesthesia with the patient or authorized representative who has indicated his/her understanding and acceptance.   Dental Advisory Given  Plan Discussed with: Anesthesiologist, CRNA and Surgeon  Anesthesia Plan Comments:         Anesthesia Quick Evaluation

## 2015-04-20 NOTE — Progress Notes (Addendum)
80 yo bm admitted from PACU s/p lt chest wall pacemaker insertion.  A&O x3.  No distress on ra.  Cardiac monitor placed on pt, pt denies chest pain.  Dressing to lt chest wall dry and intact,sling to lt arm. No swelling noted to chest or arm. Lungs clear bil.  Abdomen benign.  SL lt hand flushes well, SL rt fa flushes well.  Skin assessment completed with Randie Heinz, RN Oriented to room and surroundings, POC reviewed with pt and wife.  Denies need.  CB in reach, SR up x 2.

## 2015-04-20 NOTE — Plan of Care (Signed)
Problem: Education: Goal: Knowledge of [facility] Education information/materials will improve Outcome: Progressing Unit admission booklet given to pt  Problem: Safety: Goal: Ability to remain free from injury will improve Outcome: Progressing Fall precautions in place  Problem: Pain Managment: Goal: General experience of comfort will improve Outcome: Progressing Medicate prn  Problem: Physical Regulation: Goal: Will remain free from infection Outcome: Progressing IV antibiotics ordered  Problem: Phase III Progression Outcomes Goal: Ambulating in room or hall Outcome: Not Progressing Bedrest till am Goal: Limited arm movement per orders Outcome: Progressing Sling inplace

## 2015-04-21 ENCOUNTER — Encounter: Payer: Self-pay | Admitting: Cardiology

## 2015-04-21 DIAGNOSIS — I2510 Atherosclerotic heart disease of native coronary artery without angina pectoris: Secondary | ICD-10-CM | POA: Diagnosis not present

## 2015-04-21 DIAGNOSIS — M1990 Unspecified osteoarthritis, unspecified site: Secondary | ICD-10-CM | POA: Diagnosis not present

## 2015-04-21 DIAGNOSIS — G4730 Sleep apnea, unspecified: Secondary | ICD-10-CM | POA: Diagnosis not present

## 2015-04-21 DIAGNOSIS — E785 Hyperlipidemia, unspecified: Secondary | ICD-10-CM | POA: Diagnosis not present

## 2015-04-21 DIAGNOSIS — E1140 Type 2 diabetes mellitus with diabetic neuropathy, unspecified: Secondary | ICD-10-CM | POA: Diagnosis not present

## 2015-04-21 DIAGNOSIS — Z87442 Personal history of urinary calculi: Secondary | ICD-10-CM | POA: Diagnosis not present

## 2015-04-21 DIAGNOSIS — E079 Disorder of thyroid, unspecified: Secondary | ICD-10-CM | POA: Diagnosis not present

## 2015-04-21 DIAGNOSIS — N400 Enlarged prostate without lower urinary tract symptoms: Secondary | ICD-10-CM | POA: Diagnosis not present

## 2015-04-21 DIAGNOSIS — I442 Atrioventricular block, complete: Secondary | ICD-10-CM | POA: Diagnosis not present

## 2015-04-21 MED ORDER — CEPHALEXIN 250 MG PO CAPS: 250.0000 mg | ORAL_CAPSULE | Freq: Four times a day (QID) | ORAL | Status: DC

## 2015-04-21 MED ORDER — CEPHALEXIN 250 MG PO CAPS
250.0000 mg | ORAL_CAPSULE | Freq: Four times a day (QID) | ORAL | Status: DC
Start: 2015-04-21 — End: 2015-04-21
  Administered 2015-04-21: 250 mg via ORAL
  Filled 2015-04-21: qty 1

## 2015-04-21 MED ORDER — LEVOTHYROXINE SODIUM 175 MCG PO TABS: 175.0000 ug | ORAL_TABLET | Freq: Every day | ORAL | Status: DC

## 2015-04-21 NOTE — Progress Notes (Signed)
Patient pushed to visitor entrance via volunteer staff.

## 2015-04-21 NOTE — Plan of Care (Signed)
Problem: Discharge Progression Outcomes Goal: Other Discharge Outcomes/Goals Outcome: Completed/Met Date Met:  04/21/15 Pt is alert and oriented x 4, denies pain and need for treatment of pain, on room air, vital signs stable, up in room with stand by assist, good appetite, bm on 11/17, v paced on tele, pt is d/c to home without services, started on kefelx and will continue for at discharge, appointment scheduled to f/u with cardiologist on 11/28, instructed to remove incision bandage on 11/19 per Dr. Callwood and to limit extreme movement. Rx will be faxed to [facility] in [place] per MD, Uneventful shift.         

## 2015-05-01 DIAGNOSIS — R011 Cardiac murmur, unspecified: Secondary | ICD-10-CM | POA: Diagnosis not present

## 2015-05-01 DIAGNOSIS — E784 Other hyperlipidemia: Secondary | ICD-10-CM | POA: Diagnosis not present

## 2015-05-01 DIAGNOSIS — E079 Disorder of thyroid, unspecified: Secondary | ICD-10-CM | POA: Diagnosis not present

## 2015-05-01 DIAGNOSIS — Z950 Presence of cardiac pacemaker: Secondary | ICD-10-CM | POA: Diagnosis not present

## 2015-05-01 DIAGNOSIS — N400 Enlarged prostate without lower urinary tract symptoms: Secondary | ICD-10-CM | POA: Diagnosis not present

## 2015-05-01 DIAGNOSIS — I10 Essential (primary) hypertension: Secondary | ICD-10-CM | POA: Diagnosis not present

## 2015-05-09 ENCOUNTER — Telehealth: Payer: Self-pay

## 2015-05-09 NOTE — Telephone Encounter (Signed)
Wife, Morey Hummingbird, called and states that patient had some [employer] late last night. Then about 2 am he woke up with stomach feeling bloated, then started to have diarrhea and vomited. He was up and down all night with diarrhea. No fever or chills he is resting now. Advised wife to make sure patient stays hydrated. She wanted to make sure nothing else patient needed to do at this time due to he had pacemaker placed on November 16th. I advised her sounds like GI issues but would let you review to make sure nothing else is needed. Thank you-aa

## 2015-05-09 NOTE — Telephone Encounter (Signed)
Wife advised, patient is resting. And advised her to call us if they need us-aa

## 2015-05-09 NOTE — Telephone Encounter (Signed)
As long as not passing out or having CP/SOB.

## 2015-05-11 DIAGNOSIS — I442 Atrioventricular block, complete: Secondary | ICD-10-CM | POA: Diagnosis not present

## 2015-05-24 DIAGNOSIS — R2689 Other abnormalities of gait and mobility: Secondary | ICD-10-CM | POA: Diagnosis not present

## 2015-05-24 DIAGNOSIS — E1142 Type 2 diabetes mellitus with diabetic polyneuropathy: Secondary | ICD-10-CM | POA: Diagnosis not present

## 2015-06-06 ENCOUNTER — Encounter: Payer: Self-pay | Admitting: Physical Therapy

## 2015-06-06 ENCOUNTER — Ambulatory Visit: Payer: Commercial Managed Care - HMO | Attending: Neurology | Admitting: Physical Therapy

## 2015-06-06 DIAGNOSIS — R269 Unspecified abnormalities of gait and mobility: Secondary | ICD-10-CM | POA: Insufficient documentation

## 2015-06-06 DIAGNOSIS — R531 Weakness: Secondary | ICD-10-CM | POA: Diagnosis not present

## 2015-06-06 NOTE — Therapy (Signed)
[facility] [address]. [place], [place], [address] Phone: [phone]   Fax:  [phone]  Physical Therapy Evaluation  Patient Details  Name: Todd Ortega MRN: [id number] Date of Birth: [birth year] Referring Provider: Dr. Manuella Ghazi  Encounter Date: 06/06/2015      PT End of Session - 06/06/15 1402    Visit Number 1   Number of Visits 8   Date for PT Re-Evaluation 07/04/15   PT Start Time [id number]   PT Stop Time 1340   PT Time Calculation (min) 59 min   Equipment Utilized During Treatment Gait belt   Activity Tolerance Patient tolerated treatment well;No increased pain   Behavior During Therapy [facility] for tasks assessed/performed      Past Medical History  Diagnosis Date  . Hyperthyroidism   . Coronary artery disease   . Sleep apnea   . Shortness of breath dyspnea   . Diabetes mellitus without complication ([place])   . Chronic kidney disease   . Arthritis   . BPH (benign prostatic hypertrophy)   . Hyperlipidemia   . Hypertension   . Neuropathy ([place])   . Nephrolithiasis   . Complete heart block [facility])     Past Surgical History  Procedure Laterality Date  . Cardiac catheterization    . Mass excision      removed from left hand  . Hernia repair Bilateral     Inguinal Hernia Repair  . Pacemaker insertion N/A 04/20/2015    Procedure: INSERTION PACEMAKER;  Surgeon: Isaias Cowman, MD;  Location: [facility] ORS;  Service: Cardiovascular;  Laterality: N/A;    There were no vitals filed for this visit.  Visit Diagnosis:  Abnormality of gait  Generalized weakness      Subjective Assessment - 06/06/15 1348    Subjective Pt reports foot numbness gradually over the past couple of years. Pt states that his foot numbness has gotten worse over the past couple of months and has decreased sensation to his mid calf bilaterally. Pt denies any pain. 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 Pt reports one fall over the summer while working in his yard.   Limitations Standing;Walking;Lifting;House hold activities   How long can you stand comfortably? < 5 mins   How long can you walk comfortably? without SPC < 5 mins, with SPC < 10 mins   Patient Stated Goals return to work if [employer] in workshop without needing a cane or assistive device/mowing the yard/walking outside without pain.   Currently in Pain? No/denies            [facility] PT Assessment - 06/06/15 0001    Assessment   Medical Diagnosis balance/foot drop   Referring Provider Dr. Manuella Ghazi   Onset Date/Surgical Date 06/03/13   Hand Dominance Right   Next MD Visit 11/02/2015   Prior Therapy none   Balance Screen   Has the patient fallen in the past 6 months Yes   How many times? 1   Has the patient had a decrease in activity level because of a fear of falling?  Yes   Is the patient reluctant to leave their home because of a fear of falling?  No   Sensation   Light Touch Impaired Detail   Balance   Balance Assessed Yes       OBJECTIVE: There ex: Seated heel and toe raises x 30 each. Pt demonstrates good  form issued as HEP. Reviewed HEP with patient (see handout). Pt educated on importance of ambulation with SPC on all surfaces based on BERG score.       PT Education - 06-07-15 1401    Education provided Yes   Education Details Pt educated on importance of being active at home. See above handout.    Person(s) Educated Patient   Methods Explanation;Demonstration;Handout   Comprehension Returned demonstration;Verbalized understanding            PT Long Term Goals - 06/07/2015 1650    PT LONG TERM GOAL #1   Title Pt will improve BERG score to > 45/56 in order to decrease fall risk with SPC ambulation.   Baseline 37/56 on June 07, 2015   Time 4   Period Weeks   Status New   PT LONG TERM GOAL #2   Title Pt will increase LEFS score to > 70/80 in order to improve functional mobilty.    Baseline 52/80 on 1/3   Time 4   Period Weeks   Status New   PT LONG TERM GOAL #3   Title Pt I with HEP to increase B hip flexion and dorsiflexion by 1/2 MMT grade in order to return to gardening.   Baseline B hip flexion 3/5, DF R 3/5 L 4/5    Time 4   Period Weeks   Status New   PT LONG TERM GOAL #4   Title Pt able to complete yardwork/ mow the grass without reports of stumbles.    Time 4   Period Weeks   Status New   PT LONG TERM GOAL #5   Title Pt will transition to community wellness and walking program in order to facilate return to outdoor hobbies in his yard.   Time 4   Period Weeks   Status New            Plan - 07-Jun-2015 1404    Clinical Impression Statement Pt is a pleasant 81 y.o. referred to PT with balance and foot drop. Pt denies any pain throughout treatment. Pt's light touch is dimished bilaterally below the knee and discrimitive touch is diminshed bilaterally but R worse than L foot. LE MMT: B hip flexoion 3/5, B hamstring 4/5, L quad 5/5 and R quad 4/5, DF R 3/5 and L DF 4/5. Pt ambulates with SPC occasionally for futher distances. Pt ambulates with a rigid gait pattern with components of ataxia. Pt demonstrates decreased knee and hip flexion along with B foot clearance with ambulation. BERG: 37/56. LEFS: 52/80. Pt. will benefit from skilled PT services to increase B LE muscle strengthening/ balance to improve safety/ functional mobility.       Pt will benefit from skilled therapeutic intervention in order to improve on the following deficits Abnormal gait;Decreased endurance;Decreased coordination;Decreased range of motion;Difficulty walking;Decreased balance;Decreased mobility;Decreased strength;Postural dysfunction;Improper body mechanics;Impaired sensation;Decreased safety awareness;Decreased activity tolerance   Rehab Potential Fair   Clinical Impairments Affecting Rehab Potential Neuropathy in B LEs   PT Frequency 2x / week   PT Duration 4 weeks   PT  Treatment/Interventions ADLs/Self Care Home Management;Neuromuscular re-education;Patient/family education;Passive range of motion;Energy conservation;Manual techniques;Therapeutic exercise;Balance training;Moist Heat;Electrical Stimulation;Cryotherapy;Stair training;Functional mobility training;Therapeutic activities;Gait training   PT Next Visit Plan TUG/Airex balance training/6MWT/gait with SPC/increased knee flexion/hip flexion   PT Home Exercise Plan see handout   Consulted and Agree with Plan of Care Patient          G-Codes - 06/07/2015 1701    Functional Assessment Tool  Used Berg/ LEFS/ clinical judgement/ muscle weakness   Functional Limitation Mobility: Walking and moving around   Mobility: Walking and Moving Around Current Status [phone]) At least 40 percent but less than 60 percent impaired, limited or restricted   Mobility: Walking and Moving Around Goal Status [phone]) At least 1 percent but less than 20 percent impaired, limited or restricted       Problem List Patient Active Problem List   Diagnosis Date Noted  . CHB (complete heart block) ([place]) 04/20/2015  . Benign fibroma of prostate 10/17/2014  . Arteriosclerosis of coronary artery 10/17/2014  . Essential (primary) hypertension 10/17/2014  . Enthesopathy 10/17/2014  . HLD (hyperlipidemia) 10/17/2014  . Hyperthyroidism 10/17/2014  . 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 Neuropathy ([place]) 10/17/2014  . Apnea, sleep 10/17/2014  . Diabetes mellitus, type 2 ([place]) 10/17/2014  . Benign neoplasm of colon 10/19/2013  . BP (high blood pressure) 10/19/2013  . Benign prostatic hyperplasia with urinary obstruction 04/06/2013  . Calculus of kidney 04/06/2013  . Abnormal prostate specific antigen 04/06/2013  . Frank hematuria 04/06/2013  . Incomplete bladder emptying 04/06/2013   Pura Spice, PT, DPT # [id number]  Georgina Pillion, SPT 06/06/2015, 5:06 PM  [facility]  [facility] [address] [place], [place], [address] Phone: [phone]   Fax:  [phone]  Name: Todd Ortega MRN: [id number] Date of Birth: [birth year]

## 2015-06-08 ENCOUNTER — Encounter: Payer: Self-pay | Admitting: Physical Therapy

## 2015-06-08 ENCOUNTER — Ambulatory Visit: Payer: Commercial Managed Care - HMO | Admitting: Physical Therapy

## 2015-06-08 DIAGNOSIS — R531 Weakness: Secondary | ICD-10-CM

## 2015-06-08 DIAGNOSIS — R269 Unspecified abnormalities of gait and mobility: Secondary | ICD-10-CM

## 2015-06-08 NOTE — Therapy (Addendum)
[facility] [address] [place], [place], [address] Phone: [phone]   Fax:  [phone]  Physical Therapy Treatment  Patient Details  Name: Todd Ortega MRN: [id number] Date of Birth: [birth year] Referring Provider: Dr. Manuella Ghazi  Encounter Date: 06/08/2015      PT End of Session - 06/08/15 1712    Visit Number 2   Number of Visits 8   Date for PT Re-Evaluation 07/04/15   PT Start Time [id number]   PT Stop Time 1515   PT Time Calculation (min) 47 min   Equipment Utilized During Treatment Gait belt   Activity Tolerance Patient tolerated treatment well;No increased pain   Behavior During Therapy [facility] for tasks assessed/performed      Past Medical History  Diagnosis Date  . Hyperthyroidism   . Coronary artery disease   . Sleep apnea   . Shortness of breath dyspnea   . Diabetes mellitus without complication ([place])   . Chronic kidney disease   . Arthritis   . BPH (benign prostatic hypertrophy)   . Hyperlipidemia   . Hypertension   . Neuropathy ([place])   . Nephrolithiasis   . Complete heart block [facility])     Past Surgical History  Procedure Laterality Date  . Cardiac catheterization    . Mass excision      removed from left hand  . Hernia repair Bilateral     Inguinal Hernia Repair  . Pacemaker insertion N/A 04/20/2015    Procedure: INSERTION PACEMAKER;  Surgeon: Isaias Cowman, MD;  Location: [facility] ORS;  Service: Cardiovascular;  Laterality: N/A;    There were no vitals filed for this visit.  Visit Diagnosis:  Abnormality of gait  Generalized weakness      Subjective Assessment - 06/08/15 1711    Subjective Pt reports no new complaints since last visit. Pt reports having a flight of steps in his home that he has to do whenever returning or leaving from home.    Limitations Standing;Walking;Lifting;House hold activities   How long can you stand comfortably? < 5 mins   How long can you walk comfortably? without SPC < 5  mins, with SPC < 10 mins   Patient Stated Goals return to work if [employer] in workshop without needing a cane or assistive device/mowing the yard/walking outside without pain.   Currently in Pain? No/denies     OBJECTIVE: Neuro re-ed: Balance on foam and firm with eyes open and eyes closed 3 x 30 seconds. Attempted heel raises on foam but patient unable to lift foot. Weight shifts on airex, pt had to hold on to shift side to side. Pt required CGA to shift forwards and backwards due to poor spatial awareness. Obstacle course with weaving, balance, single leg stance and cone taps with CGA of 2. Pt had difficulty maintaining balance for single leg stance. Single leg stance in //  bars with color cone tapping (dual task) in order to test reflexes. Gait training: Ambulation with SPC for safety. Verbal cuing required for cadence and proper step initiating.   Pt response to tx for medical necessity: Pt benefits from balance training to decrease fall risk and increase safety awareness. Pt benefits from gait training with SPC in order to demonstrate safety with community mobility.       PT Long Term Goals - 06/06/15 1650    PT LONG TERM GOAL #1   Title Pt will improve BERG score to > 45/56 in order to decrease fall risk with  SPC ambulation.   Baseline 37/56 on 06/06/15   Time 4   Period Weeks   Status New   PT LONG TERM GOAL #2   Title Pt will increase LEFS score to > 70/80 in order to improve functional mobilty.   Baseline 52/80 on 1/3   Time 4   Period Weeks   Status New   PT LONG TERM GOAL #3   Title Pt I with HEP to increase B hip flexion and dorsiflexion by 1/2 MMT grade in order to return to gardening.   Baseline B hip flexion 3/5, DF R 3/5 L 4/5    Time 4   Period Weeks   Status New   PT LONG TERM GOAL #4   Title Pt able to complete yardwork/ mow the grass without reports of stumbles.    Time 4   Period Weeks   Status New   PT LONG TERM GOAL #5   Title Pt will transition to  community wellness and walking program in order to facilate return to outdoor hobbies in his yard.   Time 4   Period Weeks   Status New               Plan - 06/08/15 1712    Clinical Impression Statement Pt demonstrates an ataxic like gait pattern. Pt is able to maintain balance with knees slightly flexed but cannot balance with knees extended. Pt stands with forward posture but posterior trunk lean. Pt places all of his weight in his heels and is unable to shift weight forward with verbal cuing. Pt demonstrates fast cadence wtih step overs and obstacle course. Pt has no loss of balance noted but multiple near loss of balance episodes. Pt is unaware of balance deficits or spatial awareness secondary to lack of proprioceptive feedback from feet/ankles.    Pt will benefit from skilled therapeutic intervention in order to improve on the following deficits Abnormal gait;Decreased endurance;Decreased coordination;Decreased range of motion;Difficulty walking;Decreased balance;Decreased mobility;Decreased strength;Postural dysfunction;Improper body mechanics;Impaired sensation;Decreased safety awareness;Decreased activity tolerance   Rehab Potential Fair   Clinical Impairments Affecting Rehab Potential Neuropathy in B LEs   PT Frequency 2x / week   PT Duration 4 weeks   PT Treatment/Interventions ADLs/Self Care Home Management;Neuromuscular re-education;Patient/family education;Passive range of motion;Energy conservation;Manual techniques;Therapeutic exercise;Balance training;Moist Heat;Electrical Stimulation;Cryotherapy;Stair training;Functional mobility training;Therapeutic activities;Gait training   PT Next Visit Plan strengthening/motor control for quads/increased balance/functional mobility/gait training   PT Home Exercise Plan continue current plan   Consulted and Agree with Plan of Care Patient        Problem List Patient Active Problem List   Diagnosis Date Noted  . CHB (complete  heart block) ([place]) 04/20/2015  . Benign fibroma of prostate 10/17/2014  . Arteriosclerosis of coronary artery 10/17/2014  . Essential (primary) hypertension 10/17/2014  . Enthesopathy 10/17/2014  . HLD (hyperlipidemia) 10/17/2014  . Hyperthyroidism 10/17/2014  . 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 Neuropathy ([place]) 10/17/2014  . Apnea, sleep 10/17/2014  . Diabetes mellitus, type 2 ([place]) 10/17/2014  . Benign neoplasm of colon 10/19/2013  . BP (high blood pressure) 10/19/2013  . Benign prostatic hyperplasia with urinary obstruction 04/06/2013  . Calculus of kidney 04/06/2013  . Abnormal prostate specific antigen 04/06/2013  . Frank hematuria 04/06/2013  . Incomplete bladder emptying 04/06/2013    Lavone Neri, SPT 06/08/2015, 5:16 PM  [facility] [address]. [place], [place], [address] Phone: [phone]   Fax:  [phone]  Name: Todd Ortega MRN: [id number] Date of Birth: 28-Mar-1935

## 2015-06-13 ENCOUNTER — Ambulatory Visit: Payer: Commercial Managed Care - HMO | Admitting: Physical Therapy

## 2015-06-13 DIAGNOSIS — R531 Weakness: Secondary | ICD-10-CM

## 2015-06-13 DIAGNOSIS — R269 Unspecified abnormalities of gait and mobility: Secondary | ICD-10-CM

## 2015-06-14 NOTE — Therapy (Signed)
[facility] [address] [place], [place], [address] Phone: [phone]   Fax:  [phone]  Physical Therapy Treatment  Patient Details  Name: Todd Ortega MRN: [id number] Date of Birth: [birth year] Referring Provider: Dr. Manuella Ghazi  Encounter Date: 06/13/2015      PT End of Session - 06/14/15 1403    Visit Number 3   Number of Visits 8   Date for PT Re-Evaluation 07/04/15   PT Start Time [id number]   PT Stop Time 1510   PT Time Calculation (min) 52 min   Equipment Utilized During Treatment Gait belt   Activity Tolerance Patient tolerated treatment well;No increased pain   Behavior During Therapy [facility] for tasks assessed/performed      Past Medical History  Diagnosis Date  . Hyperthyroidism   . Coronary artery disease   . Sleep apnea   . Shortness of breath dyspnea   . Diabetes mellitus without complication ([place])   . Chronic kidney disease   . Arthritis   . BPH (benign prostatic hypertrophy)   . Hyperlipidemia   . Hypertension   . Neuropathy ([place])   . Nephrolithiasis   . Complete heart block [facility])     Past Surgical History  Procedure Laterality Date  . Cardiac catheterization    . Mass excision      removed from left hand  . Hernia repair Bilateral     Inguinal Hernia Repair  . Pacemaker insertion N/A 04/20/2015    Procedure: INSERTION PACEMAKER;  Surgeon: Isaias Cowman, MD;  Location: [facility] ORS;  Service: Cardiovascular;  Laterality: N/A;    There were no vitals filed for this visit.  Visit Diagnosis:  Abnormality of gait  Generalized weakness      Subjective Assessment - 06/13/15 1517    Subjective Pt reports no pain or new symptoms upon arrival to PT tx session.    Limitations Standing;Walking;Lifting;House hold activities   How long can you stand comfortably? < 5 mins   How long can you walk comfortably? without SPC < 5 mins, with SPC < 10 mins   Patient Stated Goals return to work if [employer] in  workshop without needing a cane or assistive device/mowing the yard/walking outside without pain.   Currently in Pain? No/denies        OBJECTIVE: Neuro re-ed: In tall kneeling, throwing and catching a ball with mirror for visual feedback for midline alignment. In half kneeling throwing and catching a ball with CGA. Loss of balance and increased posteriorly right lean noted with R foot in front. Functional reaching in the // bars. Difficulty noted balance without use of UE support. Balance on firm and foam ground with mirror for visual feedback. Increased knee flexion noted with all balance. Forwards and backwards walking tandem and normally. Side stepping over Airex. Pt demonstrates poor motor control and proprioceptive awareness. ait training: Ambulating in the hallway with verbal cues for increased cadence and proper step length.   Pt response to tx for medical necessity: Pt benefits from balance training to increase proprioceptive awareness and safety with functional mobility. Pt benefits from gait training for increased safety with functional mobility.           PT Long Term Goals - 06/06/15 1650    PT LONG TERM GOAL #1   Title Pt will improve BERG score to > 45/56 in order to decrease fall risk with SPC ambulation.   Baseline 37/56 on 06/06/15   Time 4   Period Weeks  Status New   PT LONG TERM GOAL #2   Title Pt will increase LEFS score to > 70/80 in order to improve functional mobilty.   Baseline 52/80 on 1/3   Time 4   Period Weeks   Status New   PT LONG TERM GOAL #3   Title Pt I with HEP to increase B hip flexion and dorsiflexion by 1/2 MMT grade in order to return to gardening.   Baseline B hip flexion 3/5, DF R 3/5 L 4/5    Time 4   Period Weeks   Status New   PT LONG TERM GOAL #4   Title Pt able to complete yardwork/ mow the grass without reports of stumbles.    Time 4   Period Weeks   Status New   PT LONG TERM GOAL #5   Title Pt will transition to community  wellness and walking program in order to facilate return to outdoor hobbies in his yard.   Time 4   Period Weeks   Status New               Plan - 06/14/15 1405    Clinical Impression Statement Pt demonstrates good motor control in tall kneeling. Pt demonstrates increased postural lean in half kneeling with R LE forward. Pt has difficulty maintaining upright posture without posterior trunk lean with all kneeling activities. Pt demonstrates poor control of ankle strategies and all compensatory from the hips. Pt has poor control with functional reach without UE support.    Pt will benefit from skilled therapeutic intervention in order to improve on the following deficits Abnormal gait;Decreased endurance;Decreased coordination;Decreased range of motion;Difficulty walking;Decreased balance;Decreased mobility;Decreased strength;Postural dysfunction;Improper body mechanics;Impaired sensation;Decreased safety awareness;Decreased activity tolerance   Rehab Potential Fair   Clinical Impairments Affecting Rehab Potential Neuropathy in B LEs   PT Frequency 2x / week   PT Duration 4 weeks   PT Treatment/Interventions ADLs/Self Care Home Management;Neuromuscular re-education;Patient/family education;Passive range of motion;Energy conservation;Manual techniques;Therapeutic exercise;Balance training;Moist Heat;Electrical Stimulation;Cryotherapy;Stair training;Functional mobility training;Therapeutic activities;Gait training   PT Next Visit Plan strengthening/motor control for quads/increased balance/functional mobility/gait training   PT Home Exercise Plan continue current plan   Consulted and Agree with Plan of Care Patient        Problem List Patient Active Problem List   Diagnosis Date Noted  . CHB (complete heart block) ([place]) 04/20/2015  . Benign fibroma of prostate 10/17/2014  . Arteriosclerosis of coronary artery 10/17/2014  . Essential (primary) hypertension 10/17/2014  . Enthesopathy  10/17/2014  . HLD (hyperlipidemia) 10/17/2014  . Hyperthyroidism 10/17/2014  . 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 Neuropathy ([place]) 10/17/2014  . Apnea, sleep 10/17/2014  . Diabetes mellitus, type 2 ([place]) 10/17/2014  . Benign neoplasm of colon 10/19/2013  . BP (high blood pressure) 10/19/2013  . Benign prostatic hyperplasia with urinary obstruction 04/06/2013  . Calculus of kidney 04/06/2013  . Abnormal prostate specific antigen 04/06/2013  . Frank hematuria 04/06/2013  . Incomplete bladder emptying 04/06/2013    Lavone Neri, SPT 06/14/2015, 2:09 PM  [facility] [address] [place], [place], [address] Phone: [phone]   Fax:  [phone]  Name: Todd Ortega MRN: [id number] Date of Birth: 08-17-1934

## 2015-06-15 ENCOUNTER — Encounter: Payer: Self-pay | Admitting: Physical Therapy

## 2015-06-15 ENCOUNTER — Ambulatory Visit: Payer: Commercial Managed Care - HMO | Admitting: Physical Therapy

## 2015-06-15 DIAGNOSIS — R269 Unspecified abnormalities of gait and mobility: Secondary | ICD-10-CM

## 2015-06-15 DIAGNOSIS — R531 Weakness: Secondary | ICD-10-CM | POA: Diagnosis not present

## 2015-06-15 NOTE — Therapy (Signed)
[facility] [address]. [place], [place], [address] Phone: [phone]   Fax:  [phone]  Physical Therapy Treatment  Patient Details  Name: Todd Ortega MRN: [id number] Date of Birth: 1935-01-23 Referring Provider: Dr. Manuella Ghazi  Encounter Date: 06/15/2015      PT End of Session - 06/15/15 1647    Visit Number 4   Number of Visits 8   Date for PT Re-Evaluation 07/04/15   PT Start Time 1427   PT Stop Time 1531   PT Time Calculation (min) 64 min   Equipment Utilized During Treatment Gait belt   Activity Tolerance Patient tolerated treatment well;No increased pain   Behavior During Therapy [facility] for tasks assessed/performed      Past Medical History  Diagnosis Date  . Hyperthyroidism   . Coronary artery disease   . Sleep apnea   . Shortness of breath dyspnea   . Diabetes mellitus without complication ([place])   . Chronic kidney disease   . Arthritis   . BPH (benign prostatic hypertrophy)   . Hyperlipidemia   . Hypertension   . Neuropathy ([place])   . Nephrolithiasis   . Complete heart block [facility])     Past Surgical History  Procedure Laterality Date  . Cardiac catheterization    . Mass excision      removed from left hand  . Hernia repair Bilateral     Inguinal Hernia Repair  . Pacemaker insertion N/A 04/20/2015    Procedure: INSERTION PACEMAKER;  Surgeon: Isaias Cowman, MD;  Location: [facility] ORS;  Service: Cardiovascular;  Laterality: N/A;    There were no vitals filed for this visit.  Visit Diagnosis:  Abnormality of gait  Generalized weakness      Subjective Assessment - 06/15/15 1645    Subjective Pt reports no pain or any new limitations. Pt was very motivated during obstacle course and at end of treatment when on the SCIFIT   Limitations Standing;Walking;Lifting;House hold activities   How long can you stand comfortably? < 5 mins   How long can you walk comfortably? without SPC < 5 mins, with SPC < 10 mins    Patient Stated Goals return to work if [employer] in workshop without needing a cane or assistive device/mowing the yard/walking outside without pain.   Currently in Pain? No/denies       Objective: Neuro re-ed: Balance on Airex x 4 for 30 seconds in //. Balance on Airex with neck extension x 2 for 30 seconds in //. Obstacle course with uneven surface using Airex, pillows, and blue mat x 2 with cane and x 3 without cane. Gait Training: Step over 3" steps emphasizing heel strike x 5 in //. Walking outside clinic for 10 minutes on different inclines. SCIFIT 10 minutes (no charge.)  Pt response to treatment for medical necessity: Pt will benefit from skilled PT to increase strength and stability in order to ambulate and be able to complete garden activities without fall risk.        PT Long Term Goals - 06/06/15 1650    PT LONG TERM GOAL #1   Title Pt will improve BERG score to > 45/56 in order to decrease fall risk with SPC ambulation.   Baseline 37/56 on 06/06/15   Time 4   Period Weeks   Status New   PT LONG TERM GOAL #2   Title Pt will increase LEFS score to > 70/80 in order to improve functional mobilty.   Baseline 52/80  on 1/3   Time 4   Period Weeks   Status New   PT LONG TERM GOAL #3   Title Pt I with HEP to increase B hip flexion and dorsiflexion by 1/2 MMT grade in order to return to gardening.   Baseline B hip flexion 3/5, DF R 3/5 L 4/5    Time 4   Period Weeks   Status New   PT LONG TERM GOAL #4   Title Pt able to complete yardwork/ mow the grass without reports of stumbles.    Time 4   Period Weeks   Status New   PT LONG TERM GOAL #5   Title Pt will transition to community wellness and walking program in order to facilate return to outdoor hobbies in his yard.   Time 4   Period Weeks   Status New               Plan - 06/15/15 1649    Clinical Impression Statement Pt demonstrates good stability with ambulation 5 mins outside and during obstacle  course involving different surfaces. Pt. needed verbal cueing for heel strike during obstacle course and step over 3" steps. Pt also needed verbal cueing to  slow down pace during obstacle course.   Pt will benefit from skilled therapeutic intervention in order to improve on the following deficits Abnormal gait;Decreased endurance;Decreased coordination;Decreased range of motion;Difficulty walking;Decreased balance;Decreased mobility;Decreased strength;Postural dysfunction;Improper body mechanics;Impaired sensation;Decreased safety awareness;Decreased activity tolerance   Rehab Potential Fair   Clinical Impairments Affecting Rehab Potential Neuropathy in B LEs   PT Frequency 2x / week   PT Duration 4 weeks   PT Treatment/Interventions ADLs/Self Care Home Management;Neuromuscular re-education;Patient/family education;Passive range of motion;Energy conservation;Manual techniques;Therapeutic exercise;Balance training;Moist Heat;Electrical Stimulation;Cryotherapy;Stair training;Functional mobility training;Therapeutic activities;Gait training   PT Next Visit Plan strengthening/motor control for quads/increased balance/functional mobility/gait training   PT Home Exercise Plan continue current plan   Consulted and Agree with Plan of Care Patient        Problem List Patient Active Problem List   Diagnosis Date Noted  . CHB (complete heart block) ([place]) 04/20/2015  . Benign fibroma of prostate 10/17/2014  . Arteriosclerosis of coronary artery 10/17/2014  . Essential (primary) hypertension 10/17/2014  . Enthesopathy 10/17/2014  . HLD (hyperlipidemia) 10/17/2014  . Hyperthyroidism 10/17/2014  . 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 Neuropathy ([place]) 10/17/2014  . Apnea, sleep 10/17/2014  . Diabetes mellitus, type 2 ([place]) 10/17/2014  . Benign neoplasm of colon 10/19/2013  . BP (high blood pressure) 10/19/2013  . Benign prostatic hyperplasia with urinary obstruction  04/06/2013  . Calculus of kidney 04/06/2013  . Abnormal prostate specific antigen 04/06/2013  . Frank hematuria 04/06/2013  . Incomplete bladder emptying 04/06/2013    Georgina Pillion, SPT 06/15/2015, 4:57 PM  [facility] [address] [place], [place], [address] Phone: [phone]   Fax:  [phone]  Name: Colton Palmateer MRN: [id number] Date of Birth: [birth year]

## 2015-06-19 ENCOUNTER — Encounter: Payer: Commercial Managed Care - HMO | Admitting: Physical Therapy

## 2015-06-21 ENCOUNTER — Encounter: Payer: Commercial Managed Care - HMO | Admitting: Physical Therapy

## 2015-06-22 ENCOUNTER — Encounter: Payer: Self-pay | Admitting: Physical Therapy

## 2015-06-22 ENCOUNTER — Ambulatory Visit: Payer: Commercial Managed Care - HMO | Admitting: Physical Therapy

## 2015-06-22 DIAGNOSIS — R269 Unspecified abnormalities of gait and mobility: Secondary | ICD-10-CM | POA: Diagnosis not present

## 2015-06-22 DIAGNOSIS — R531 Weakness: Secondary | ICD-10-CM

## 2015-06-22 NOTE — Therapy (Signed)
[facility] [address] [place], [place], [address] Phone: [phone]   Fax:  [phone]  Physical Therapy Treatment  Patient Details  Name: Todd Ortega MRN: [id number] Date of Birth: [birth year] Referring Provider: Dr. Manuella Ghazi  Encounter Date: 06/22/2015      PT End of Session - 06/22/15 1649    Visit Number 5   Number of Visits 8   Date for PT Re-Evaluation 07/04/15   PT Start Time 0227   PT Stop Time 0326   PT Time Calculation (min) 59 min   Equipment Utilized During Treatment Gait belt   Activity Tolerance Patient tolerated treatment well;No increased pain   Behavior During Therapy [facility] for tasks assessed/performed      Past Medical History  Diagnosis Date  . Hyperthyroidism   . Coronary artery disease   . Sleep apnea   . Shortness of breath dyspnea   . Diabetes mellitus without complication ([place])   . Chronic kidney disease   . Arthritis   . BPH (benign prostatic hypertrophy)   . Hyperlipidemia   . Hypertension   . Neuropathy ([place])   . Nephrolithiasis   . Complete heart block [facility])     Past Surgical History  Procedure Laterality Date  . Cardiac catheterization    . Mass excision      removed from left hand  . Hernia repair Bilateral     Inguinal Hernia Repair  . Pacemaker insertion N/A 04/20/2015    Procedure: INSERTION PACEMAKER;  Surgeon: Isaias Cowman, MD;  Location: [facility] ORS;  Service: Cardiovascular;  Laterality: N/A;    There were no vitals filed for this visit.  Visit Diagnosis:  Abnormality of gait  Generalized weakness      Subjective Assessment - 06/22/15 1648    Subjective Pt reports no new complaints of pain but states that he has been having trouble, due to muscle weakness, with going up stairs from his basement which consists of 14 steps. Pt states he has been keeping up with his HEP program everyday and has noticed feeling more stable everywhere at home besides his stairs.    Limitations Standing;Walking;Lifting;House hold activities   How long can you stand comfortably? < 5 mins   How long can you walk comfortably? without SPC < 5 mins, with SPC < 10 mins   Patient Stated Goals return to work if [employer] in workshop without needing a cane or assistive device/mowing the yard/walking outside without pain.   Currently in Pain? No/denies      Objective: Gait training: ambulation in hallway for 5 minutes without use of SPC. Emphasized B heel to toe strike and consistent gait pattern. Pt required verbal cueing for good posture and to keep eyes forward. Neuro re-ed: obstacle course with Airex foam pads and step overs emphasizing B foot clearance and heel to toe strike x 4 laps. Pt required verbal cueing to slow down pace. Weight shifting in //-bars with B UE support x 3 for 20 seconds. Ther ex: R/L step ups on 6" step in //-bars with B UE support x 20. Step ups on 6" step in //-bars with support of single UE x 12. Seated hip abduction with green theraband x 20. Nustep: 10 mins L7 (cool down/no charge)  Pt response to treatment for medical necessity: pt reports no increase in pain after tx session. Pt will benefit from skilled PT by improving balance and stability in order to increase functional mobility and safely navigate at home. Pt  will also benefit with gait training by achieving a more consistent and safe gait pattern in order to ambulate safely in community.          PT Long Term Goals - 06/06/15 1650    PT LONG TERM GOAL #1   Title Pt will improve BERG score to > 45/56 in order to decrease fall risk with SPC ambulation.   Baseline 37/56 on 06/06/15   Time 4   Period Weeks   Status New   PT LONG TERM GOAL #2   Title Pt will increase LEFS score to > 70/80 in order to improve functional mobilty.   Baseline 52/80 on 1/3   Time 4   Period Weeks   Status New   PT LONG TERM GOAL #3   Title Pt I with HEP to increase B hip flexion and dorsiflexion by 1/2 MMT  grade in order to return to gardening.   Baseline B hip flexion 3/5, DF R 3/5 L 4/5    Time 4   Period Weeks   Status New   PT LONG TERM GOAL #4   Title Pt able to complete yardwork/ mow the grass without reports of stumbles.    Time 4   Period Weeks   Status New   PT LONG TERM GOAL #5   Title Pt will transition to community wellness and walking program in order to facilate return to outdoor hobbies in his yard.   Time 4   Period Weeks   Status New               Plan - 06/22/15 1650    Clinical Impression Statement Pt demonstrated decreased stability and increased foot drop with obstacle course, primarily with step overs. Pt needed verbal cueing to slow down and for B foot clearance.Pt demonstrated a more consistent and safe gait pattern when walking in hallway with consistent heel to toe strike. Pt did need to be cued multiple times to keep back/neck straight and eye forwards. Pt demonstrated good posture and strenth with 6" step ups in //-bars but had poor control on ascend/descend of 6" step ups without UE support.   Pt will benefit from skilled therapeutic intervention in order to improve on the following deficits Abnormal gait;Decreased endurance;Decreased coordination;Decreased range of motion;Difficulty walking;Decreased balance;Decreased mobility;Decreased strength;Postural dysfunction;Improper body mechanics;Impaired sensation;Decreased safety awareness;Decreased activity tolerance   Rehab Potential Fair   Clinical Impairments Affecting Rehab Potential Neuropathy in B LEs   PT Frequency 2x / week   PT Duration 4 weeks   PT Treatment/Interventions ADLs/Self Care Home Management;Neuromuscular re-education;Patient/family education;Passive range of motion;Energy conservation;Manual techniques;Therapeutic exercise;Balance training;Moist Heat;Electrical Stimulation;Cryotherapy;Stair training;Functional mobility training;Therapeutic activities;Gait training   PT Next Visit Plan  strengthening/motor control for quads/increased balance/functional mobility/gait training/   PT Home Exercise Plan continue current plan   Consulted and Agree with Plan of Care Patient        Problem List Patient Active Problem List   Diagnosis Date Noted  . CHB (complete heart block) ([place]) 04/20/2015  . Benign fibroma of prostate 10/17/2014  . Arteriosclerosis of coronary artery 10/17/2014  . Essential (primary) hypertension 10/17/2014  . Enthesopathy 10/17/2014  . HLD (hyperlipidemia) 10/17/2014  . Hyperthyroidism 10/17/2014  . 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 Neuropathy ([place]) 10/17/2014  . Apnea, sleep 10/17/2014  . Diabetes mellitus, type 2 ([place]) 10/17/2014  . Benign neoplasm of colon 10/19/2013  . BP (high blood pressure) 10/19/2013  . Benign prostatic hyperplasia with urinary obstruction  04/06/2013  . Calculus of kidney 04/06/2013  . Abnormal prostate specific antigen 04/06/2013  . Frank hematuria 04/06/2013  . Incomplete bladder emptying 04/06/2013    Georgina Pillion, SPT 06/22/2015, 5:18 PM  [facility] [address] [place], [place], [address] Phone: [phone]   Fax:  [phone]  Name: Todd Ortega MRN: [id number] Date of Birth: 1934-07-28

## 2015-06-27 ENCOUNTER — Ambulatory Visit: Payer: Commercial Managed Care - HMO | Admitting: Physical Therapy

## 2015-06-27 DIAGNOSIS — R531 Weakness: Secondary | ICD-10-CM | POA: Diagnosis not present

## 2015-06-27 DIAGNOSIS — R269 Unspecified abnormalities of gait and mobility: Secondary | ICD-10-CM | POA: Diagnosis not present

## 2015-06-28 ENCOUNTER — Encounter: Payer: Self-pay | Admitting: Physical Therapy

## 2015-06-28 NOTE — Therapy (Signed)
[place] [facility] [address] [place], [place], [address] Phone: [phone]   Fax:  [phone]  Physical Therapy Treatment  Patient Details  Name: Todd Ortega MRN: [id number] Date of Birth: 1934-09-28 Referring Provider: Dr. Manuella Ghazi  Encounter Date: 06/27/2015      PT End of Session - 06/28/15 0727    Visit Number 6   Number of Visits 8   Date for PT Re-Evaluation 07/04/15   PT Start Time [id number]   PT Stop Time 1518   PT Time Calculation (min) 61 min   Equipment Utilized During Treatment Gait belt   Activity Tolerance Patient tolerated treatment well;No increased pain   Behavior During Therapy [facility] for tasks assessed/performed      Past Medical History  Diagnosis Date  . Hyperthyroidism   . Coronary artery disease   . Sleep apnea   . Shortness of breath dyspnea   . Diabetes mellitus without complication ([place])   . Chronic kidney disease   . Arthritis   . BPH (benign prostatic hypertrophy)   . Hyperlipidemia   . Hypertension   . Neuropathy ([place])   . Nephrolithiasis   . Complete heart block [facility])     Past Surgical History  Procedure Laterality Date  . Cardiac catheterization    . Mass excision      removed from left hand  . Hernia repair Bilateral     Inguinal Hernia Repair  . Pacemaker insertion N/A 04/20/2015    Procedure: INSERTION PACEMAKER;  Surgeon: Isaias Cowman, MD;  Location: [facility] ORS;  Service: Cardiovascular;  Laterality: N/A;    There were no vitals filed for this visit.  Visit Diagnosis:  Abnormality of gait  Generalized weakness      Subjective Assessment - 06/28/15 0726    Subjective Pt has no reports or complaints of pain. Pt still notes trouble with navigating stairs.   Limitations Standing;Walking;Lifting;House hold activities   How long can you stand comfortably? < 5 mins   How long can you walk comfortably? without SPC < 5 mins, with SPC < 10 mins   Patient Stated Goals return to work if  [employer] in workshop without needing a cane or assistive device/mowing the yard/walking outside without pain.   Currently in Pain? No/denies      Objective: Ther ex: Resisted walkouts in all 4 directions x 5 laps with cueing to keep eyes forward. Step up on 6" step in //-bars x 10 with B UE support, x 10 with single UE support, x 5 with no UE support. Step over 3" steps with B UE support in //-bars x 10 emphasizing heel to toe strike. Seated marches with 4# weight x 20. Neuro re-ed: Obstacle course with cone weaving, airex foam pad, cone taps, and step overs x 4 without use of SPC with verbal cueing for B foot clearance and to decrease cadence. Single leg stance on stable ground x 2 for 30 seconds each in //-bars with B UE support. Single leg stance on airex foam pad in //-bars with B UE support x 3 for 30 seconds each. Scifit: 10 mins (no charge/cool down)  Pt response to treatment for medical necessity: Pt benefits from skilled PT to increase balance and stability in order to ambulate safely in community. Pt will also benefit with strength training in order to use stairs at home with more confidence and less use of UE support with railings.        PT Long Term Goals -  06/06/15 1650    PT LONG TERM GOAL #1   Title Pt will improve BERG score to > 45/56 in order to decrease fall risk with SPC ambulation.   Baseline 37/56 on 06/06/15   Time 4   Period Weeks   Status New   PT LONG TERM GOAL #2   Title Pt will increase LEFS score to > 70/80 in order to improve functional mobilty.   Baseline 52/80 on 1/3   Time 4   Period Weeks   Status New   PT LONG TERM GOAL #3   Title Pt I with HEP to increase B hip flexion and dorsiflexion by 1/2 MMT grade in order to return to gardening.   Baseline B hip flexion 3/5, DF R 3/5 L 4/5    Time 4   Period Weeks   Status New   PT LONG TERM GOAL #4   Title Pt able to complete yardwork/ mow the grass without reports of stumbles.    Time 4   Period Weeks    Status New   PT LONG TERM GOAL #5   Title Pt will transition to community wellness and walking program in order to facilate return to outdoor hobbies in his yard.   Time 4   Period Weeks   Status New            Plan - 06/28/15 [id number]    Clinical Impression Statement Pt required verbal cueing to keep eyes forward during resisted walkouts. Pt demonstrated fast cadence with obstacle course with verbal cueing to slow down and required moderate assistance from PT with cone taps. Pt had trouble with cone weaving, with 2 slight stumbles during last 2 laps. Pt demonstrated good posture with step ups with B UE support with minimal verbal cueing to focus on heel to toe strike.   Pt will benefit from skilled therapeutic intervention in order to improve on the following deficits Abnormal gait;Decreased endurance;Decreased coordination;Decreased range of motion;Difficulty walking;Decreased balance;Decreased mobility;Decreased strength;Postural dysfunction;Improper body mechanics;Impaired sensation;Decreased safety awareness;Decreased activity tolerance   Rehab Potential Fair   Clinical Impairments Affecting Rehab Potential Neuropathy in B LEs   PT Frequency 2x / week   PT Duration 4 weeks   PT Treatment/Interventions ADLs/Self Care Home Management;Neuromuscular re-education;Patient/family education;Passive range of motion;Energy conservation;Manual techniques;Therapeutic exercise;Balance training;Moist Heat;Electrical Stimulation;Cryotherapy;Stair training;Functional mobility training;Therapeutic activities;Gait training   PT Next Visit Plan strengthening/motor control for quads/increased balance/functional mobility/gait training/stair training   PT Home Exercise Plan continue current plan   Consulted and Agree with Plan of Care Patient        Problem List Patient Active Problem List   Diagnosis Date Noted  . CHB (complete heart block) ([place]) 04/20/2015  . Benign fibroma of prostate 10/17/2014   . Arteriosclerosis of coronary artery 10/17/2014  . Essential (primary) hypertension 10/17/2014  . Enthesopathy 10/17/2014  . HLD (hyperlipidemia) 10/17/2014  . Hyperthyroidism 10/17/2014  . 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 Neuropathy ([place]) 10/17/2014  . Apnea, sleep 10/17/2014  . Diabetes mellitus, type 2 ([place]) 10/17/2014  . Benign neoplasm of colon 10/19/2013  . BP (high blood pressure) 10/19/2013  . Benign prostatic hyperplasia with urinary obstruction 04/06/2013  . Calculus of kidney 04/06/2013  . Abnormal prostate specific antigen 04/06/2013  . Frank hematuria 04/06/2013  . Incomplete bladder emptying 04/06/2013    Georgina Pillion, SPT 06/28/2015, 7:55 AM  [facility] [address] [place], [place], [address] Phone: [phone]  Fax:  [phone]  Name: Amit Zirkel MRN: [id number] Date of Birth: [birth year]

## 2015-06-29 ENCOUNTER — Encounter: Payer: Self-pay | Admitting: Physical Therapy

## 2015-06-29 ENCOUNTER — Ambulatory Visit: Payer: Commercial Managed Care - HMO | Admitting: Physical Therapy

## 2015-06-29 DIAGNOSIS — R531 Weakness: Secondary | ICD-10-CM

## 2015-06-29 DIAGNOSIS — R269 Unspecified abnormalities of gait and mobility: Secondary | ICD-10-CM | POA: Diagnosis not present

## 2015-06-29 NOTE — Therapy (Signed)
[facility] [address] [place], [place], [address] Phone: [phone]   Fax:  [phone]  Physical Therapy Treatment  Patient Details  Name: Todd Ortega MRN: [id number] Date of Birth: 1934-11-13 Referring Provider: Dr. Manuella Ghazi  Encounter Date: 06/29/2015      PT End of Session - 06/29/15 1815    Visit Number 7   Number of Visits 8   Date for PT Re-Evaluation 07/04/15   PT Start Time [id number]   PT Stop Time 1525   PT Time Calculation (min) 64 min   Equipment Utilized During Treatment Gait belt   Activity Tolerance Patient tolerated treatment well;No increased pain   Behavior During Therapy [facility] for tasks assessed/performed      Past Medical History  Diagnosis Date  . Hyperthyroidism   . Coronary artery disease   . Sleep apnea   . Shortness of breath dyspnea   . Diabetes mellitus without complication ([place])   . Chronic kidney disease   . Arthritis   . BPH (benign prostatic hypertrophy)   . Hyperlipidemia   . Hypertension   . Neuropathy ([place])   . Nephrolithiasis   . Complete heart block [facility])     Past Surgical History  Procedure Laterality Date  . Cardiac catheterization    . Mass excision      removed from left hand  . Hernia repair Bilateral     Inguinal Hernia Repair  . Pacemaker insertion N/A 04/20/2015    Procedure: INSERTION PACEMAKER;  Surgeon: Isaias Cowman, MD;  Location: [facility] ORS;  Service: Cardiovascular;  Laterality: N/A;    There were no vitals filed for this visit.  Visit Diagnosis:  Abnormality of gait  Generalized weakness      Subjective Assessment - 06/29/15 1814    Subjective Pt reports no pain, just quad weakness when using stairs at home. Pt reports he has been able to ambulatewith [facility] for longer distances without feeling unstable.   Limitations Standing;Walking;Lifting;House hold activities   How long can you stand comfortably? < 5 mins   How long can you walk comfortably? without  SPC < 5 mins, with SPC < 10 mins   Patient Stated Goals return to work if [employer] in workshop without needing a cane or assistive device/mowing the yard/walking outside without pain.   Currently in Pain? No/denies        Objective: Scitfit: 10 mins (no charge/warm up). Ther ex: Resisted walkouts in //-bars forwards, backwards, and lateral x 5 lap each direction. Forwards and lateral step ups x 30 in //-bars. Seated marches with 5# weight x 10. Standing marches with 5#weight in //-bars x 20 with verbal cueing to bend only at hip and not lean back and also visual feedback from mirror to keep back straight. Neuro re-ed: Single leg stance on B LE on airex foam pad x 30 for 30 seconds with single UE support in //-bars. Obstacle course with step overs, cone taps, balance on airex foam pad, and cone weaving x 6 laps with cueing to keep posture upright.   Pt response to treatment for medical necessity: Pt benefits from skilled PT to increase balance and stability in order to ambulate safely in community. Pt will also benefit with strength training in order to use stairs at home with more confidence in placing weight on R leg.        PT Long Term Goals - 06/06/15 1650    PT LONG TERM GOAL #1   Title Pt  will improve BERG score to > 45/56 in order to decrease fall risk with SPC ambulation.   Baseline 37/56 on 06/06/15   Time 4   Period Weeks   Status New   PT LONG TERM GOAL #2   Title Pt will increase LEFS score to > 70/80 in order to improve functional mobilty.   Baseline 52/80 on 1/3   Time 4   Period Weeks   Status New   PT LONG TERM GOAL #3   Title Pt I with HEP to increase B hip flexion and dorsiflexion by 1/2 MMT grade in order to return to gardening.   Baseline B hip flexion 3/5, DF R 3/5 L 4/5    Time 4   Period Weeks   Status New   PT LONG TERM GOAL #4   Title Pt able to complete yardwork/ mow the grass without reports of stumbles.    Time 4   Period Weeks   Status New   PT  LONG TERM GOAL #5   Title Pt will transition to community wellness and walking program in order to facilate return to outdoor hobbies in his yard.   Time 4   Period Weeks   Status New            Plan - 06/29/15 1816    Clinical Impression Statement Pt demonstrated good eccentric control with resisted walkouts in the backwards and forwards direction. Pt still demonstrates trouble planting foot with resisted walk outs. Pt required verbal and tactile cueing throughout treatment session to keep eyes forward and back straight. Pt demonstrated more stabilty with single leg stance on L leg, stating there is both weakness and a fear of putting all of his weight on R leg. Pt demonstrated poor balance with airex foam pad on obstacle course with multiple LOB episodes. Pt demonstrated good endurance throughout treamtent session.   Pt will benefit from skilled therapeutic intervention in order to improve on the following deficits Abnormal gait;Decreased endurance;Decreased coordination;Decreased range of motion;Difficulty walking;Decreased balance;Decreased mobility;Decreased strength;Postural dysfunction;Improper body mechanics;Impaired sensation;Decreased safety awareness;Decreased activity tolerance   Rehab Potential Fair   Clinical Impairments Affecting Rehab Potential Neuropathy in B LEs   PT Frequency 2x / week   PT Duration 4 weeks   PT Treatment/Interventions ADLs/Self Care Home Management;Neuromuscular re-education;Patient/family education;Passive range of motion;Energy conservation;Manual techniques;Therapeutic exercise;Balance training;Moist Heat;Electrical Stimulation;Cryotherapy;Stair training;Functional mobility training;Therapeutic activities;Gait training   PT Next Visit Plan motor control for quads/increased balance/functional mobility/gait training/stair training/ discuss progress and possible discharge with HEP   PT Home Exercise Plan continue current plan   Consulted and Agree with Plan  of Care Patient        Problem List Patient Active Problem List   Diagnosis Date Noted  . CHB (complete heart block) ([place]) 04/20/2015  . Benign fibroma of prostate 10/17/2014  . Arteriosclerosis of coronary artery 10/17/2014  . Essential (primary) hypertension 10/17/2014  . Enthesopathy 10/17/2014  . HLD (hyperlipidemia) 10/17/2014  . Hyperthyroidism 10/17/2014  . 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 Neuropathy ([place]) 10/17/2014  . Apnea, sleep 10/17/2014  . Diabetes mellitus, type 2 ([place]) 10/17/2014  . Benign neoplasm of colon 10/19/2013  . BP (high blood pressure) 10/19/2013  . Benign prostatic hyperplasia with urinary obstruction 04/06/2013  . Calculus of kidney 04/06/2013  . Abnormal prostate specific antigen 04/06/2013  . Frank hematuria 04/06/2013  . Incomplete bladder emptying 04/06/2013    Georgina Pillion, SPT 06/29/2015, 6:28 PM  [place]  [facility] [address] [place], [place], [address] Phone: [phone]   Fax:  [phone]  Name: Todd Ortega MRN: [id number] Date of Birth: 1934/12/04

## 2015-07-04 ENCOUNTER — Ambulatory Visit: Payer: Commercial Managed Care - HMO | Admitting: Physical Therapy

## 2015-07-04 ENCOUNTER — Encounter: Payer: Self-pay | Admitting: Physical Therapy

## 2015-07-04 DIAGNOSIS — R531 Weakness: Secondary | ICD-10-CM

## 2015-07-04 DIAGNOSIS — R269 Unspecified abnormalities of gait and mobility: Secondary | ICD-10-CM | POA: Diagnosis not present

## 2015-07-04 NOTE — Therapy (Signed)
[place] [facility] [address] [place], [place], [address] Phone: [phone]   Fax:  [phone]  Physical Therapy Treatment  Patient Details  Name: Todd Ortega MRN: [id number] Date of Birth: Aug 25, 1934 Referring Provider: Dr. Manuella Ghazi  Encounter Date: 07/04/2015      PT End of Session - 07/04/15 1431    Visit Number 8   Number of Visits 8   Date for PT Re-Evaluation 07/04/15   Equipment Utilized During Treatment Gait belt   Activity Tolerance Patient tolerated treatment well;No increased pain   Behavior During Therapy [facility] for tasks assessed/performed      Past Medical History  Diagnosis Date  . Hyperthyroidism   . Coronary artery disease   . Sleep apnea   . Shortness of breath dyspnea   . Diabetes mellitus without complication ([place])   . Chronic kidney disease   . Arthritis   . BPH (benign prostatic hypertrophy)   . Hyperlipidemia   . Hypertension   . Neuropathy ([place])   . Nephrolithiasis   . Complete heart block [facility])     Past Surgical History  Procedure Laterality Date  . Cardiac catheterization    . Mass excision      removed from left hand  . Hernia repair Bilateral     Inguinal Hernia Repair  . Pacemaker insertion N/A 04/20/2015    Procedure: INSERTION PACEMAKER;  Surgeon: Isaias Cowman, MD;  Location: [facility] ORS;  Service: Cardiovascular;  Laterality: N/A;    There were no vitals filed for this visit.  Visit Diagnosis:  Abnormality of gait  Generalized weakness      Subjective Assessment - 07/04/15 1428    Subjective Pt has no new complaints of pain. Pt reports he feels more stable at home and more confident without use of SPC. Pt states he always keeps SPC in car but was able to go to dinner and church this weekend with use of SPC.    Limitations Standing;Walking;Lifting;House hold activities   How long can you stand comfortably? < 5 mins   How long can you walk comfortably? without SPC < 5 mins, with  SPC < 10 mins   Patient Stated Goals return to work if [employer] in workshop without needing a cane or assistive device/mowing the yard/walking outside without pain.   Currently in Pain? No/denies     OBJECTIVE: There ex: Resisted gait with one BTB all 4 planes x 10 each direction. Step overs 3" steps. Standing hip abduction x 30. Supine bridging x 30 with green TB resistance. Standing marching x 30. (issued as HEP). Neuro re-ed: Airex balance and step overs. Single leg balance. Double limb balance on Airex.   Pt response to tx for medical necessity: Pt demonstrates independence with strengthening and is able to translate to an independent home exercise program. Pt benefits from balance and strengthening to increase safety with functional mobility.           PT Education - 07/04/15 1430    Education provided Yes   Education Details see above handout for additional exercises added to current HEP.    Person(s) Educated Patient   Methods Explanation;Demonstration;Handout   Comprehension Verbalized understanding;Returned demonstration             PT Long Term Goals - 07/04/15 1445    PT LONG TERM GOAL #1   Title Pt will improve BERG score to > 45/56 in order to decrease fall risk with SPC ambulation.   Baseline 37/56 on  06/06/15   Time 4   Period Weeks   Status Deferred   PT LONG TERM GOAL #2   Title Pt will increase LEFS score to > 70/80 in order to improve functional mobilty.   Baseline 52/80 on 1/3   Time 4   Period Weeks   Status Deferred   PT LONG TERM GOAL #3   Title Pt I with HEP to increase B hip flexion and dorsiflexion by 1/2 MMT grade in order to return to gardening.   Baseline B hip flexion 3/5, DF R 3/5 L 4/5    Time 4   Period Weeks   Status Achieved   PT LONG TERM GOAL #4   Title Pt able to complete yardwork/ mow the grass without reports of stumbles.    Time 4   Period Weeks   Status Achieved   PT LONG TERM GOAL #5   Title Pt will transition to  community wellness and walking program in order to facilate return to outdoor hobbies in his yard.   Time 4   Period Weeks   Status Not Met            Plan - 07/04/15 1431    Clinical Impression Statement Pt demonstrates good form with backwards resisted walkouts with good toe strike. Pt demonstrates moderate eccentric control with lateral resisted walkouts with one black theraband. Pt demonstrates difficulty with picking up his feet when stepping over and around the Airex.  Pt shows an increase in posterior lean on Airex but demonstrates no loss of balance. Pt has made steady progress towards his goals and ambulates safely with his [facility] when he remembers to use it. At this time, pt is transitioning to independent home exercise program per his request.    Pt will benefit from skilled therapeutic intervention in order to improve on the following deficits Abnormal gait;Decreased endurance;Decreased coordination;Decreased range of motion;Difficulty walking;Decreased balance;Decreased mobility;Decreased strength;Postural dysfunction;Improper body mechanics;Impaired sensation;Decreased safety awareness;Decreased activity tolerance   Rehab Potential Fair   Clinical Impairments Affecting Rehab Potential Neuropathy in B LEs   PT Frequency 2x / week   PT Duration 4 weeks   PT Treatment/Interventions ADLs/Self Care Home Management;Neuromuscular re-education;Patient/family education;Passive range of motion;Energy conservation;Manual techniques;Therapeutic exercise;Balance training;Moist Heat;Electrical Stimulation;Cryotherapy;Stair training;Functional mobility training;Therapeutic activities;Gait training   PT Next Visit Plan --   PT Home Exercise Plan see above for updated HEP   Consulted and Agree with Plan of Care Patient        Problem List Patient Active Problem List   Diagnosis Date Noted  . CHB (complete heart block) ([place]) 04/20/2015  . Benign fibroma of prostate 10/17/2014  .  Arteriosclerosis of coronary artery 10/17/2014  . Essential (primary) hypertension 10/17/2014  . Enthesopathy 10/17/2014  . HLD (hyperlipidemia) 10/17/2014  . Hyperthyroidism 10/17/2014  . 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 Neuropathy ([place]) 10/17/2014  . Apnea, sleep 10/17/2014  . Diabetes mellitus, type 2 ([place]) 10/17/2014  . Benign neoplasm of colon 10/19/2013  . BP (high blood pressure) 10/19/2013  . Benign prostatic hyperplasia with urinary obstruction 04/06/2013  . Calculus of kidney 04/06/2013  . Abnormal prostate specific antigen 04/06/2013  . Frank hematuria 04/06/2013  . Incomplete bladder emptying 04/06/2013    Georgina Pillion, SPT 07/04/2015, 2:49 PM  [facility] [address] [place], [place], [address] Phone: [phone]   Fax:  [phone]  Name: Todd Ortega MRN: [id number] Date of Birth: [birth year]

## 2015-08-08 ENCOUNTER — Other Ambulatory Visit: Payer: Self-pay | Admitting: Family Medicine

## 2015-08-09 ENCOUNTER — Telehealth: Payer: Self-pay

## 2015-08-09 NOTE — Telephone Encounter (Signed)
Patent called and states he needs referral authorization for [facility] for Dr. Bjorn Loser office before patient goes back in April. Please address. Thank you-aa

## 2015-08-10 NOTE — Telephone Encounter (Signed)
Referral submitted but is in pending status.Pt advised to call back to check status before appointment

## 2015-09-12 DIAGNOSIS — N401 Enlarged prostate with lower urinary tract symptoms: Secondary | ICD-10-CM | POA: Diagnosis not present

## 2015-09-12 DIAGNOSIS — R339 Retention of urine, unspecified: Secondary | ICD-10-CM | POA: Diagnosis not present

## 2015-09-12 DIAGNOSIS — R972 Elevated prostate specific antigen [PSA]: Secondary | ICD-10-CM | POA: Diagnosis not present

## 2015-09-12 DIAGNOSIS — N200 Calculus of kidney: Secondary | ICD-10-CM | POA: Diagnosis not present

## 2015-09-12 DIAGNOSIS — N138 Other obstructive and reflux uropathy: Secondary | ICD-10-CM | POA: Diagnosis not present

## 2015-09-22 DIAGNOSIS — M21371 Foot drop, right foot: Secondary | ICD-10-CM | POA: Diagnosis not present

## 2015-09-22 DIAGNOSIS — E1142 Type 2 diabetes mellitus with diabetic polyneuropathy: Secondary | ICD-10-CM | POA: Diagnosis not present

## 2015-09-22 DIAGNOSIS — M21372 Foot drop, left foot: Secondary | ICD-10-CM | POA: Diagnosis not present

## 2015-11-21 ENCOUNTER — Other Ambulatory Visit: Payer: Self-pay | Admitting: Family Medicine

## 2015-11-28 DIAGNOSIS — I495 Sick sinus syndrome: Secondary | ICD-10-CM | POA: Diagnosis not present

## 2015-11-28 DIAGNOSIS — I10 Essential (primary) hypertension: Secondary | ICD-10-CM | POA: Diagnosis not present

## 2015-11-28 DIAGNOSIS — R011 Cardiac murmur, unspecified: Secondary | ICD-10-CM | POA: Diagnosis not present

## 2015-11-28 DIAGNOSIS — E784 Other hyperlipidemia: Secondary | ICD-10-CM | POA: Diagnosis not present

## 2015-11-28 DIAGNOSIS — I442 Atrioventricular block, complete: Secondary | ICD-10-CM | POA: Diagnosis not present

## 2015-11-28 DIAGNOSIS — N400 Enlarged prostate without lower urinary tract symptoms: Secondary | ICD-10-CM | POA: Diagnosis not present

## 2015-11-28 DIAGNOSIS — Z950 Presence of cardiac pacemaker: Secondary | ICD-10-CM | POA: Diagnosis not present

## 2015-11-28 DIAGNOSIS — E079 Disorder of thyroid, unspecified: Secondary | ICD-10-CM | POA: Diagnosis not present

## 2016-01-30 ENCOUNTER — Other Ambulatory Visit: Payer: Self-pay | Admitting: Family Medicine

## 2016-04-02 ENCOUNTER — Telehealth: Payer: Self-pay | Admitting: Family Medicine

## 2016-04-02 NOTE — Telephone Encounter (Signed)
Called Pt to schedule AWV with [facility] for Nov 29th - knb

## 2016-04-12 ENCOUNTER — Ambulatory Visit (INDEPENDENT_AMBULATORY_CARE_PROVIDER_SITE_OTHER): Payer: Commercial Managed Care - HMO | Admitting: Family Medicine

## 2016-04-12 VITALS — BP 130/58 | HR 60 | Temp 98.8°F | Ht 66.0 in | Wt 176.0 lb

## 2016-04-12 DIAGNOSIS — Z0000 Encounter for general adult medical examination without abnormal findings: Secondary | ICD-10-CM

## 2016-04-12 NOTE — Patient Instructions (Signed)
Mr. Betsinger , Thank you for taking time to come for your Medicare Wellness Visit. I appreciate your ongoing commitment to your health goals. Please review the following plan we discussed and let me know if I can assist you in the future.   These are the goals we discussed: Goals    . Increase water intake          Starting 04/12/16, I will increase my water intake to 6 glasses of water a day.       This is a list of the screening recommended for you and due dates:  Health Maintenance  Topic Date Due  . Complete foot exam   06/02/2016*  . Hemoglobin A1C  06/02/2016*  . Shingles Vaccine  04/12/2026*  . Eye exam for diabetics  10/01/2016  . Tetanus Vaccine  02/22/2021  . Flu Shot  Completed  . Pneumonia vaccines  Completed  *Topic was postponed. The date shown is not the original due date.   Preventive Care for Adults  A healthy lifestyle and preventive care can promote health and wellness. Preventive health guidelines for adults include the following key practices.  . A routine yearly physical is a good way to check with your health care provider about your health and preventive screening. It is a chance to share any concerns and updates on your health and to receive a thorough exam.  . Visit your dentist for a routine exam and preventive care every 6 months. Brush your teeth twice a day and floss once a day. Good oral hygiene prevents tooth decay and gum disease.  . The frequency of eye exams is based on your age, health, family medical history, use  of contact lenses, and other factors. Follow your health care provider's ecommendations for frequency of eye exams.  . Eat a healthy diet. Foods like vegetables, fruits, whole grains, low-fat dairy products, and lean protein foods contain the nutrients you need without too many calories. Decrease your intake of foods high in solid fats, added sugars, and salt. Eat the right amount of calories for you. Get information about a proper diet  from your health care provider, if necessary.  . Regular physical exercise is one of the most important things you can do for your health. Most adults should get at least 150 minutes of moderate-intensity exercise (any activity that increases your heart rate and causes you to sweat) each week. In addition, most adults need muscle-strengthening exercises on 2 or more days a week.  Silver Sneakers may be a benefit available to you. To determine eligibility, you may visit the website: www.silversneakers.com or contact program at 216-300-8745 Mon-Fri between 8AM-8PM.   . Maintain a healthy weight. The body mass index (BMI) is a screening tool to identify possible weight problems. It provides an estimate of body fat based on height and weight. Your health care provider can find your BMI and can help you achieve or maintain a healthy weight.   For adults 20 years and older: ? A BMI below 18.5 is considered underweight. ? A BMI of 18.5 to 24.9 is normal. ? A BMI of 25 to 29.9 is considered overweight. ? A BMI of 30 and above is considered obese.   . Maintain normal blood lipids and cholesterol levels by exercising and minimizing your intake of saturated fat. Eat a balanced diet with plenty of fruit and vegetables. Blood tests for lipids and cholesterol should begin at age 70 and be repeated every 5 years. If your lipid  or cholesterol levels are high, you are over 50, or you are at high risk for heart disease, you may need your cholesterol levels checked more frequently. Ongoing high lipid and cholesterol levels should be treated with medicines if diet and exercise are not working.  . If you smoke, find out from your health care provider how to quit. If you do not use tobacco, please do not start.  . If you choose to drink alcohol, please do not consume more than 2 drinks per day. One drink is considered to be 12 ounces (355 mL) of beer, 5 ounces (148 mL) of wine, or 1.5 ounces (44 mL) of liquor.  .  If you are 60-52 years old, ask your health care provider if you should take aspirin to prevent strokes.  . Use sunscreen. Apply sunscreen liberally and repeatedly throughout the day. You should seek shade when your shadow is shorter than you. Protect yourself by wearing long sleeves, pants, a wide-brimmed hat, and sunglasses year round, whenever you are outdoors.  . Once a month, do a whole body skin exam, using a mirror to look at the skin on your back. Tell your health care provider of new moles, moles that have irregular borders, moles that are larger than a pencil eraser, or moles that have changed in shape or color.

## 2016-04-12 NOTE — Progress Notes (Addendum)
Subjective:   Dock Shamburg is a 81 y.o. male who presents for Medicare Annual/Subsequent preventive examination.  Review of Systems:  N/A  Cardiac Risk Factors include: dyslipidemia;male gender;hypertension;advanced age (>81men, >34 women)     Objective:    Vitals: BP (!) 130/58 (BP Location: Right Arm)   Pulse 60   Temp 98.8 F (37.1 C) (Oral)   Ht 5\' 6"  (1.676 m)   Wt 176 lb (79.8 kg)   BMI 28.41 kg/m   Body mass index is 28.41 kg/m.  Tobacco History  Smoking Status  . Never Smoker  Smokeless Tobacco  . Never Used     Counseling given: Not Answered   Past Medical History:  Diagnosis Date  . Arthritis   . BPH (benign prostatic hypertrophy)   . Chronic kidney disease   . Complete heart block ([place])   . Coronary artery disease   . Diabetes mellitus without complication ([place])   . Hyperlipidemia   . Hypertension   . Hyperthyroidism   . Nephrolithiasis   . Neuropathy ([place])   . Shortness of breath dyspnea   . Sleep apnea    Past Surgical History:  Procedure Laterality Date  . CARDIAC CATHETERIZATION    . HERNIA REPAIR Bilateral    Inguinal Hernia Repair  . MASS EXCISION     removed from left hand  . PACEMAKER INSERTION N/A 04/20/2015   Procedure: INSERTION PACEMAKER;  Surgeon: Isaias Cowman, MD;  Location: [facility] ORS;  Service: Cardiovascular;  Laterality: N/A;   Family History  Problem Relation Age of Onset  . Heart attack Mother   . Gout Mother   . Diabetes Mother   . Hypertension Mother   . Hyperlipidemia Mother   . Lung cancer Father   . Drug abuse Brother    History  Sexual Activity  . Sexual activity: Not on file    Outpatient Encounter Prescriptions as of 04/12/2016  Medication Sig  . aspirin 81 MG tablet Take 81 mg by mouth daily.   . Calcium Carb-Cholecalciferol (CALCIUM 500+D3 PO) Take 1 tablet by mouth daily.  . finasteride (PROSCAR) 5 MG tablet Take 5 mg by mouth at bedtime.   Marland Kitchen lisinopril (PRINIVIL,ZESTRIL) 40 MG tablet TAKE 1  TABLET EVERY DAY (NEED MD APPOINTMENT)  . methimazole (TAPAZOLE) 5 MG tablet Take 2.5 mg by mouth daily.   . simvastatin (ZOCOR) 40 MG tablet TAKE 1 TABLET EVERY DAY (NEED MD APPOINTMENT)  . tamsulosin (FLOMAX) 0.4 MG CAPS capsule Take 0.4 mg by mouth daily after breakfast.   . cephALEXin (KEFLEX) 250 MG capsule Take 1 capsule (250 mg total) by mouth every 6 (six) hours. (Patient not taking: Reported on 04/12/2016)  . Cholecalciferol (VITAMIN D3) 2000 UNITS capsule Take 2,000 Units by mouth daily.   Marland Kitchen gabapentin (NEURONTIN) 300 MG capsule Take 300 mg by mouth 2 (two) times daily.   Marland Kitchen glucose blood test strip BAYER CONTOUR TEST (In Vitro Strip)  1 (one) Strip Strip check sugars once daily, or as needed for 0 days  Quantity: 300;  Refills: 4   Ordered :31-Mar-2013  [name] ;  Started 31-Mar-2013 Active Comments: Medication taken as needed.  Marland Kitchen levothyroxine (SYNTHROID, LEVOTHROID) 175 MCG tablet Take 1 tablet (175 mcg total) by mouth daily before breakfast. (Patient not taking: Reported on 04/12/2016)  . Omega-3 Fatty Acids (FISH OIL) 1200 MG CAPS Take by mouth.   No facility-administered encounter medications on file as of 04/12/2016.     Activities of Daily Living In your  present state of health, do you have any difficulty performing the following activities: 04/12/2016 04/20/2015  Hearing? N -  Vision? N -  Difficulty concentrating or making decisions? N -  Walking or climbing stairs? Y -  Dressing or bathing? N -  Doing errands, shopping? N N  Preparing Food and eating ? N -  Using the Toilet? N -  In the past six months, have you accidently leaked urine? N -  Do you have problems with loss of bowel control? N -  Managing your Medications? N -  Managing your Finances? N -  Housekeeping or managing your Housekeeping? N -  Some recent data might be hidden    Patient Care Team: [name]., MD as PCP - General (Family Medicine) Murrell Redden, MD as Consulting Physician  (Urology) [name], MD as Consulting Physician (Cardiology) [name], MD as Consulting Physician (Neurology)   Assessment:     Exercise Activities and Dietary recommendations Current Exercise Habits: The patient does not participate in regular exercise at present, Exercise limited by: neurologic condition(s)  Goals    . Increase water intake          Starting 04/12/16, I will increase my water intake to 6 glasses of water a day.      Fall Risk Fall Risk  04/12/2016 04/18/2015 12/14/2014  Falls in the past year? Yes No Yes  Number falls in past yr: 2 or more - 1  Injury with Fall? No - No  Risk Factor Category  High Fall Risk - -   Depression Screen PHQ 2/9 Scores 04/12/2016 04/18/2015  PHQ - 2 Score 0 0    Cognitive Function     6CIT Screen 04/12/2016  What Year? 0 points  What month? 0 points  What time? 0 points  Count back from 20 0 points  Months in reverse 0 points  Repeat phrase 2 points  Total Score 2    Immunization History  Administered Date(s) Administered  . Influenza,inj,Quad PF,36+ Mos 04/21/2015  . Pneumococcal Conjugate-13 03/07/2014  . Pneumococcal Polysaccharide-23 12/04/2011  . Tdap 02/23/2011   Screening Tests Health Maintenance  Topic Date Due  . FOOT EXAM  06/02/2016 (Originally 03/12/1945)  . HEMOGLOBIN A1C  06/02/2016 (Originally 06/16/2015)  . ZOSTAVAX  04/12/2026 (Originally 03/11/1997)  . OPHTHALMOLOGY EXAM  10/01/2016  . TETANUS/TDAP  02/22/2021  . INFLUENZA VACCINE  Completed  . PNA vac Low Risk Adult  Completed      Plan:  I have personally reviewed and addressed the Medicare Annual Wellness questionnaire and have noted the following in the patient's chart:  A. Medical and social history B. Use of alcohol, tobacco or illicit drugs  C. Current medications and supplements D. Functional ability and status E.  Nutritional status F.  Physical activity G. Advance directives H. List of other physicians I.    Hospitalizations, surgeries, and ER visits in previous 12 months J.  [place] such as hearing and vision if needed, cognitive and depression L. Referrals and appointments - none  In addition, I have reviewed and discussed with patient certain preventive protocols, quality metrics, and best practice recommendations. A written personalized care plan for preventive services as well as general preventive health recommendations were provided to patient.  See attached scanned questionnaire for additional information.   I have reviewed the information as  put it by [facility] LPN and was available for any questions or concerns. Miguel Aschoff MD [facility]  Signed,  Fabio Neighbors, LPN Nurse Health Advisor  MD Recommendations: follow up on A1c and diabetic foot exam.

## 2016-05-06 ENCOUNTER — Ambulatory Visit (INDEPENDENT_AMBULATORY_CARE_PROVIDER_SITE_OTHER): Payer: Commercial Managed Care - HMO | Admitting: Family Medicine

## 2016-05-06 ENCOUNTER — Encounter: Payer: Self-pay | Admitting: Family Medicine

## 2016-05-06 VITALS — BP 92/54 | HR 92 | Temp 98.9°F | Resp 14 | Wt 173.0 lb

## 2016-05-06 DIAGNOSIS — E784 Other hyperlipidemia: Secondary | ICD-10-CM | POA: Diagnosis not present

## 2016-05-06 DIAGNOSIS — E0590 Thyrotoxicosis, unspecified without thyrotoxic crisis or storm: Secondary | ICD-10-CM

## 2016-05-06 DIAGNOSIS — I10 Essential (primary) hypertension: Secondary | ICD-10-CM | POA: Diagnosis not present

## 2016-05-06 DIAGNOSIS — E7849 Other hyperlipidemia: Secondary | ICD-10-CM

## 2016-05-06 DIAGNOSIS — E1140 Type 2 diabetes mellitus with diabetic neuropathy, unspecified: Secondary | ICD-10-CM | POA: Diagnosis not present

## 2016-05-06 LAB — POCT GLYCOSYLATED HEMOGLOBIN (HGB A1C): HEMOGLOBIN A1C: 6.1

## 2016-05-06 MED ORDER — SIMVASTATIN 40 MG PO TABS: ORAL_TABLET | ORAL | 3 refills | Status: DC

## 2016-05-06 MED ORDER — LISINOPRIL 20 MG PO TABS: 20.0000 mg | ORAL_TABLET | Freq: Every day | ORAL | 3 refills | Status: DC

## 2016-05-06 NOTE — Progress Notes (Signed)
Todd Ortega  MRN: [id number] DOB: [birth year]  Subjective:  HPI  Patient is here for follow up. Last office was 04/18/15. He has seen cardiologist after his last visit for bradycardia and pacemaker was placed, he has follow up with them this month.  He also has seen Dr Manuella Ghazi for neuropathy follow ups, saw Dr Jacqlyn Larsen for urology follow up and has seen Dr Eddie Dibbles for thyroid follow up.  Last labs were done in November 2016. He does not check his sugar. Lab Results  Component Value Date   HGBA1C 6.1 12/14/2014   BP Readings from Last 3 Encounters:  05/06/16 (!) 92/54  04/12/16 (!) 130/58  04/21/15 (!) 152/78    Patient Active Problem List   Diagnosis Date Noted  . CHB (complete heart block) ([place]) 04/20/2015  . Benign fibroma of prostate 10/17/2014  . Arteriosclerosis of coronary artery 10/17/2014  . Essential (primary) hypertension 10/17/2014  . Enthesopathy 10/17/2014  . HLD (hyperlipidemia) 10/17/2014  . Hyperthyroidism 10/17/2014  . 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 Neuropathy ([place]) 10/17/2014  . Apnea, sleep 10/17/2014  . Diabetes mellitus, type 2 ([place]) 10/17/2014  . Benign neoplasm of colon 10/19/2013  . BP (high blood pressure) 10/19/2013  . Benign prostatic hyperplasia with urinary obstruction 04/06/2013  . Calculus of kidney 04/06/2013  . Abnormal prostate specific antigen 04/06/2013  . Frank hematuria 04/06/2013  . Incomplete bladder emptying 04/06/2013    Past Medical History:  Diagnosis Date  . Arthritis   . BPH (benign prostatic hypertrophy)   . Chronic kidney disease   . Complete heart block ([place])   . Coronary artery disease   . Diabetes mellitus without complication ([place])   . Hyperlipidemia   . Hypertension   . Hyperthyroidism   . Nephrolithiasis   . Neuropathy ([place])   . Shortness of breath dyspnea   . Sleep apnea     Social History   Social History  . Marital status: Married    Spouse name: N/A  . Number of children:  N/A  . Years of education: N/A   Occupational History  . Not on file.   Social History Main Topics  . Smoking status: Never Smoker  . Smokeless tobacco: Never Used  . Alcohol use No  . Drug use: No  . Sexual activity: Not on file   Other Topics Concern  . Not on file   Social History Narrative  . No narrative on file    Outpatient Encounter Prescriptions as of 05/06/2016  Medication Sig Note  . aspirin 81 MG tablet Take 81 mg by mouth daily.  10/17/2014: Received from: [facility]  . Calcium Carb-Cholecalciferol (CALCIUM 500+D3 PO) Take 1 tablet by mouth daily.   . Cholecalciferol (VITAMIN D3) 2000 UNITS capsule Take 2,000 Units by mouth daily.  04/18/2015: Received from: [place]  . cyanocobalamin 100 MCG tablet Take 100 mcg by mouth daily.   . finasteride (PROSCAR) 5 MG tablet Take 5 mg by mouth at bedtime.  10/17/2014: Received from: [facility]  . glucose blood test strip BAYER CONTOUR TEST (In Vitro Strip)  1 (one) Strip Strip check sugars once daily, or as needed for 0 days  Quantity: 300;  Refills: 4   Ordered :31-Mar-2013  [name] ;  Started 31-Mar-2013 Active Comments: Medication taken as needed. 10/17/2014: Medication taken as needed.  Received from: [facility]  . levothyroxine (SYNTHROID, LEVOTHROID) 175 MCG tablet Take 1 tablet (175 mcg total)  by mouth daily before breakfast.   . lisinopril (PRINIVIL,ZESTRIL) 40 MG tablet TAKE 1 TABLET EVERY DAY (NEED MD APPOINTMENT)   . methimazole (TAPAZOLE) 5 MG tablet Take 2.5 mg by mouth daily.  10/17/2014: Received from: [facility]  . simvastatin (ZOCOR) 40 MG tablet TAKE 1 TABLET EVERY DAY (NEED MD APPOINTMENT)   . tamsulosin (FLOMAX) 0.4 MG CAPS capsule Take 0.4 mg by mouth daily after breakfast.  12/14/2014: Received from: [facility]  . FLUZONE HIGH-DOSE 0.5 ML SUSY ADM 0.5ML IM UTD 05/06/2016: Received from: External Pharmacy  .  gabapentin (NEURONTIN) 300 MG capsule Take 300 mg by mouth 2 (two) times daily.  04/18/2015: Received from: [place]  . [DISCONTINUED] cephALEXin (KEFLEX) 250 MG capsule Take 1 capsule (250 mg total) by mouth every 6 (six) hours. (Patient not taking: Reported on 04/12/2016)   . [DISCONTINUED] Omega-3 Fatty Acids (FISH OIL) 1200 MG CAPS Take by mouth. 10/17/2014: Received from: [facility]   No facility-administered encounter medications on file as of 05/06/2016.     Allergies  Allergen Reactions  . Venlafaxine     Other reaction(s): Other (See Comments) Sweating, stomach upset, constipation    Review of Systems  Constitutional: Negative.   Eyes: Negative.   Respiratory: Negative.   Cardiovascular: Negative.   Gastrointestinal: Negative.   Musculoskeletal: Positive for joint pain (shoulder and knees-chronic).  Neurological: Positive for tingling (neuropathy in his feet.).  Endo/Heme/Allergies: Negative.     Objective:  BP (!) 92/54   Pulse 92   Temp 98.9 F (37.2 C)   Resp 14   Wt 173 lb (78.5 kg)   BMI 27.92 kg/m   Physical Exam  Constitutional: He is oriented to person, place, and time and well-developed, well-nourished, and in no distress.  HENT:  Head: Normocephalic and atraumatic.  Eyes: Conjunctivae are normal. No scleral icterus.  Neck: No thyromegaly present.  Cardiovascular: Normal rate, regular rhythm and normal heart sounds.   Pulmonary/Chest: Effort normal and breath sounds normal.  Abdominal: Soft.  Neurological: He is alert and oriented to person, place, and time.  Skin: Skin is warm and dry.  Psychiatric: Mood, memory, affect and judgment normal.    Assessment and Plan :  CAD All risk factors treated Pacemaker functional for complete heart block Hypertension With low  blood pressure today decrease lisinopril from 40-20 mg daily. Hyperlipidemia Prediabetes OSA BPH Neuropathy  I have done the exam and  reviewed the chart and it is accurate to the best of my knowledge. Development worker, community has been used and  any errors in dictation or transcription are unintentional. Miguel Aschoff M.D. [facility]

## 2016-05-07 DIAGNOSIS — E784 Other hyperlipidemia: Secondary | ICD-10-CM | POA: Diagnosis not present

## 2016-05-07 DIAGNOSIS — I10 Essential (primary) hypertension: Secondary | ICD-10-CM | POA: Diagnosis not present

## 2016-05-07 DIAGNOSIS — E0590 Thyrotoxicosis, unspecified without thyrotoxic crisis or storm: Secondary | ICD-10-CM | POA: Diagnosis not present

## 2016-05-08 ENCOUNTER — Other Ambulatory Visit: Payer: Self-pay | Admitting: Family Medicine

## 2016-05-08 LAB — LIPID PANEL WITH LDL/HDL RATIO
Cholesterol, Total: 155 mg/dL (ref 100–199)
HDL: 67 mg/dL (ref >39)
LDL Calculated: 80 mg/dL (ref 0–99)
LDl/HDL Ratio: 1.2 ratio units (ref 0.0–3.6)
Triglycerides: 40 mg/dL (ref 0–149)
VLDL CHOLESTEROL CAL: 8 mg/dL (ref 5–40)

## 2016-05-08 LAB — COMPREHENSIVE METABOLIC PANEL
A/G RATIO: 1.3 (ref 1.2–2.2)
ALBUMIN: 3.9 g/dL (ref 3.5–4.8)
ALT: 34 IU/L (ref 0–44)
AST: 38 IU/L (ref 0–40)
Alkaline Phosphatase: 101 IU/L (ref 39–117)
BILIRUBIN TOTAL: 0.4 mg/dL (ref 0.0–1.2)
BUN / CREAT RATIO: 24 (ref 10–24)
BUN: 21 mg/dL (ref 8–27)
CHLORIDE: 99 mmol/L (ref 96–106)
CO2: 24 mmol/L (ref 18–29)
Calcium: 9.3 mg/dL (ref 8.6–10.2)
Creatinine, Ser: 0.86 mg/dL (ref 0.76–1.27)
GFR calc non Af Amer: 82 mL/min/{1.73_m2} (ref >59)
GFR, EST AFRICAN AMERICAN: 95 mL/min/{1.73_m2} (ref >59)
GLUCOSE: 141 mg/dL — AB (ref 65–99)
Globulin, Total: 3 g/dL (ref 1.5–4.5)
Potassium: 4.3 mmol/L (ref 3.5–5.2)
Sodium: 135 mmol/L (ref 134–144)
TOTAL PROTEIN: 6.9 g/dL (ref 6.0–8.5)

## 2016-05-08 LAB — CBC WITH DIFFERENTIAL/PLATELET
BASOS: 0 %
Basophils Absolute: 0 10*3/uL (ref 0.0–0.2)
EOS (ABSOLUTE): 0 10*3/uL (ref 0.0–0.4)
EOS: 1 %
HEMATOCRIT: 43.5 % (ref 37.5–51.0)
HEMOGLOBIN: 14.4 g/dL (ref 13.0–17.7)
IMMATURE GRANS (ABS): 0 10*3/uL (ref 0.0–0.1)
Immature Granulocytes: 0 %
LYMPHS: 58 %
Lymphocytes Absolute: 2.9 10*3/uL (ref 0.7–3.1)
MCH: 31.4 pg (ref 26.6–33.0)
MCHC: 33.1 g/dL (ref 31.5–35.7)
MCV: 95 fL (ref 79–97)
MONOCYTES: 9 %
Monocytes Absolute: 0.4 10*3/uL (ref 0.1–0.9)
NEUTROS ABS: 1.6 10*3/uL (ref 1.4–7.0)
Neutrophils: 32 %
Platelets: 208 10*3/uL (ref 150–379)
RBC: 4.59 x10E6/uL (ref 4.14–5.80)
RDW: 13.8 % (ref 12.3–15.4)
WBC: 5 10*3/uL (ref 3.4–10.8)

## 2016-05-08 LAB — TSH: TSH: 0.665 u[IU]/mL (ref 0.450–4.500)

## 2016-05-08 MED ORDER — SIMVASTATIN 40 MG PO TABS: ORAL_TABLET | ORAL | 3 refills | Status: DC

## 2016-05-08 NOTE — Telephone Encounter (Signed)
Pt is requesting the Rx simvastatin (ZOCOR) 40 MG tablets resent to [facility] mail order.  CB#[phone]/MW

## 2016-05-08 NOTE — Telephone Encounter (Signed)
rx re sent-aa

## 2016-05-08 NOTE — Telephone Encounter (Signed)
Ok to order? Please advise. Thanks!  

## 2016-05-08 NOTE — Telephone Encounter (Signed)
ok 

## 2016-05-21 DIAGNOSIS — E784 Other hyperlipidemia: Secondary | ICD-10-CM | POA: Diagnosis not present

## 2016-05-21 DIAGNOSIS — R011 Cardiac murmur, unspecified: Secondary | ICD-10-CM | POA: Diagnosis not present

## 2016-05-21 DIAGNOSIS — I442 Atrioventricular block, complete: Secondary | ICD-10-CM | POA: Diagnosis not present

## 2016-05-21 DIAGNOSIS — Z950 Presence of cardiac pacemaker: Secondary | ICD-10-CM | POA: Diagnosis not present

## 2016-05-21 DIAGNOSIS — E079 Disorder of thyroid, unspecified: Secondary | ICD-10-CM | POA: Diagnosis not present

## 2016-05-21 DIAGNOSIS — I495 Sick sinus syndrome: Secondary | ICD-10-CM | POA: Diagnosis not present

## 2016-05-21 DIAGNOSIS — I10 Essential (primary) hypertension: Secondary | ICD-10-CM | POA: Diagnosis not present

## 2016-09-10 ENCOUNTER — Ambulatory Visit (INDEPENDENT_AMBULATORY_CARE_PROVIDER_SITE_OTHER): Payer: Medicare HMO | Admitting: Family Medicine

## 2016-09-10 VITALS — BP 92/50 | HR 76 | Resp 14 | Wt 173.0 lb

## 2016-09-10 DIAGNOSIS — N138 Other obstructive and reflux uropathy: Secondary | ICD-10-CM

## 2016-09-10 DIAGNOSIS — I10 Essential (primary) hypertension: Secondary | ICD-10-CM

## 2016-09-10 DIAGNOSIS — M21371 Foot drop, right foot: Secondary | ICD-10-CM

## 2016-09-10 DIAGNOSIS — E1140 Type 2 diabetes mellitus with diabetic neuropathy, unspecified: Secondary | ICD-10-CM | POA: Diagnosis not present

## 2016-09-10 DIAGNOSIS — N401 Enlarged prostate with lower urinary tract symptoms: Secondary | ICD-10-CM

## 2016-09-10 DIAGNOSIS — E784 Other hyperlipidemia: Secondary | ICD-10-CM

## 2016-09-10 DIAGNOSIS — Z950 Presence of cardiac pacemaker: Secondary | ICD-10-CM | POA: Diagnosis not present

## 2016-09-10 DIAGNOSIS — E7849 Other hyperlipidemia: Secondary | ICD-10-CM

## 2016-09-10 LAB — POCT GLYCOSYLATED HEMOGLOBIN (HGB A1C): HEMOGLOBIN A1C: 6.4

## 2016-09-10 LAB — POCT UA - MICROALBUMIN: MICROALBUMIN (UR) POC: 100 mg/L

## 2016-09-10 MED ORDER — LISINOPRIL 20 MG PO TABS: 20.0000 mg | ORAL_TABLET | Freq: Every day | ORAL | 3 refills | Status: DC

## 2016-09-10 NOTE — Progress Notes (Signed)
Todd Ortega  MRN: [id number] DOB: 1934-10-26  Subjective:  HPI  Patient is here for 4 months follow up. He is not checking his sugar at home, he use to do this. He is having chronic numbness in his feet and is taking Gabapentin. Lab Results  Component Value Date   HGBA1C 6.1 05/06/2016   He is checking his b/p occasionally and readings have been 130s/70. Denies cardiac symptoms. BP Readings from Last 3 Encounters:  09/10/16 (!) 92/50  05/06/16 (!) 92/54  04/12/16 (!) 130/58   Last labs were done on 05/07/16-routine labs  Patient Active Problem List   Diagnosis Date Noted  . CHB (complete heart block) ([place]) 04/20/2015  . Benign fibroma of prostate 10/17/2014  . Arteriosclerosis of coronary artery 10/17/2014  . Essential (primary) hypertension 10/17/2014  . Enthesopathy 10/17/2014  . HLD (hyperlipidemia) 10/17/2014  . Hyperthyroidism 10/17/2014  . 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 Neuropathy ([place]) 10/17/2014  . Apnea, sleep 10/17/2014  . Diabetes mellitus, type 2 ([place]) 10/17/2014  . Benign neoplasm of colon 10/19/2013  . BP (high blood pressure) 10/19/2013  . Benign prostatic hyperplasia with urinary obstruction 04/06/2013  . Calculus of kidney 04/06/2013  . Abnormal prostate specific antigen 04/06/2013  . Frank hematuria 04/06/2013  . Incomplete bladder emptying 04/06/2013    Past Medical History:  Diagnosis Date  . Arthritis   . BPH (benign prostatic hypertrophy)   . Chronic kidney disease   . Complete heart block ([place])   . Coronary artery disease   . Diabetes mellitus without complication ([place])   . Hyperlipidemia   . Hypertension   . Hyperthyroidism   . Nephrolithiasis   . Neuropathy ([place])   . Shortness of breath dyspnea   . Sleep apnea     Social History   Social History  . Marital status: Married    Spouse name: N/A  . Number of children: N/A  . Years of education: N/A   Occupational History  . Not on file.   Social  History Main Topics  . Smoking status: Never Smoker  . Smokeless tobacco: Never Used  . Alcohol use No  . Drug use: No  . Sexual activity: Not on file   Other Topics Concern  . Not on file   Social History Narrative  . No narrative on file    Outpatient Encounter Prescriptions as of 09/10/2016  Medication Sig Note  . aspirin 81 MG tablet Take 81 mg by mouth daily.  10/17/2014: Received from: [facility]  . Calcium Carb-Cholecalciferol (CALCIUM 500+D3 PO) Take 1 tablet by mouth daily.   . Cholecalciferol (VITAMIN D3) 2000 UNITS capsule Take 2,000 Units by mouth daily.  04/18/2015: Received from: [facility]  . cyanocobalamin 100 MCG tablet Take 100 mcg by mouth daily.   . finasteride (PROSCAR) 5 MG tablet Take 5 mg by mouth at bedtime.  10/17/2014: Received from: [facility]  . levothyroxine (SYNTHROID, LEVOTHROID) 175 MCG tablet Take 1 tablet (175 mcg total) by mouth daily before breakfast.   . lisinopril (PRINIVIL,ZESTRIL) 20 MG tablet Take 1 tablet (20 mg total) by mouth daily.   . methimazole (TAPAZOLE) 5 MG tablet Take 2.5 mg by mouth daily.  10/17/2014: Received from: [facility]  . simvastatin (ZOCOR) 40 MG tablet Take 1 tablet daily   . tamsulosin (FLOMAX) 0.4 MG CAPS capsule Take 0.4 mg by mouth daily after breakfast.  12/14/2014: Received from: [facility]  .  FLUZONE HIGH-DOSE 0.5 ML SUSY ADM 0.5ML IM UTD 05/06/2016: Received from: External Pharmacy  . gabapentin (NEURONTIN) 300 MG capsule Take 300 mg by mouth 2 (two) times daily.  04/18/2015: Received from: [place]  . glucose blood test strip BAYER CONTOUR TEST (In Vitro Strip)  1 (one) Strip Strip check sugars once daily, or as needed for 0 days  Quantity: 300;  Refills: 4   Ordered :31-Mar-2013  [name] ;  Started 31-Mar-2013 Active Comments: Medication taken as needed. 10/17/2014: Medication taken as needed.  Received from:  [facility]   No facility-administered encounter medications on file as of 09/10/2016.     Allergies  Allergen Reactions  . Venlafaxine     Other reaction(s): Other (See Comments) Sweating, stomach upset, constipation    Review of Systems  Constitutional: Negative.   Respiratory: Negative.   Cardiovascular: Negative.   Genitourinary:       Better on medications  Musculoskeletal: Positive for joint pain (left shoulder at times and his knees).       Finger stiffness and swelling  Neurological: Positive for tingling (numbness in his feet).       Feet feel cold at night time.    Objective:  BP (!) 92/50   Pulse 76   Resp 14   Wt 173 lb (78.5 kg)   BMI 27.92 kg/m   Physical Exam  Constitutional: He is oriented to person, place, and time and well-developed, well-nourished, and in no distress.  HENT:  Head: Normocephalic and atraumatic.  Eyes: Conjunctivae are normal. Pupils are equal, round, and reactive to light.  Neck: Normal range of motion. Neck supple.  Cardiovascular: Normal rate, regular rhythm, normal heart sounds and intact distal pulses.   No murmur heard. Pulmonary/Chest: Effort normal and breath sounds normal. No respiratory distress. He has no wheezes.  Musculoskeletal: He exhibits no edema or tenderness.  Has drop foot of the right foot on the exam. Diffuse swelling of the all the fingers of the left hand.  Neurological: He is alert and oriented to person, place, and time.  Psychiatric: Mood, memory, affect and judgment normal.   Diabetic Foot Exam - Simple   Simple Foot Form Diabetic Foot exam was performed with the following findings:  Yes 09/10/2016  3:53 PM  Visual Inspection No deformities, no ulcerations, no other skin breakdown bilaterally:  Yes Sensation Testing See comments:  Yes Pulse Check Posterior Tibialis and Dorsalis pulse intact bilaterally:  Yes Comments Decreased sensation in the fourth and fifth toes of both feet.  Patient has drop foot on the right.    Assessment and Plan :  1. Type 2 diabetes mellitus with diabetic neuropathy, without long-term current use of insulin (HCC) A1C 6.4. Worse. Will follow for now. Work on habits. - POCT HgB A1C--6.4 today. - POCT UA - Microalbumin--100--on ACEI.  2. Other hyperlipidemia Stable on last check in december 2017.  3. Essential (primary) hypertension Hypotensive but patient is asymptomatic. Will decrease Lisinopril to 20 mg. Follow.   4. Benign prostatic hyperplasia with urinary obstruction Stable on the medications.  5. Right foot drop Chronic.  6. History of cardiac pacemaker 2017. Stable.  HPI, Exam and A&P transcribed under direction and in the presence of Miguel Aschoff, MD. I have done the exam and reviewed the chart and it is accurate to the best of my knowledge. Development worker, community has been used and  any errors in dictation or transcription are unintentional. Miguel Aschoff M.D. [facility]

## 2016-10-29 DIAGNOSIS — N2889 Other specified disorders of kidney and ureter: Secondary | ICD-10-CM | POA: Diagnosis not present

## 2016-10-29 DIAGNOSIS — R3129 Other microscopic hematuria: Secondary | ICD-10-CM | POA: Diagnosis not present

## 2016-10-29 DIAGNOSIS — G629 Polyneuropathy, unspecified: Secondary | ICD-10-CM | POA: Diagnosis not present

## 2016-10-29 DIAGNOSIS — R972 Elevated prostate specific antigen [PSA]: Secondary | ICD-10-CM | POA: Diagnosis not present

## 2016-10-29 DIAGNOSIS — R339 Retention of urine, unspecified: Secondary | ICD-10-CM | POA: Diagnosis not present

## 2016-10-29 DIAGNOSIS — N401 Enlarged prostate with lower urinary tract symptoms: Secondary | ICD-10-CM | POA: Diagnosis not present

## 2016-10-29 DIAGNOSIS — I878 Other specified disorders of veins: Secondary | ICD-10-CM | POA: Diagnosis not present

## 2016-10-29 DIAGNOSIS — Z950 Presence of cardiac pacemaker: Secondary | ICD-10-CM | POA: Diagnosis not present

## 2016-10-29 DIAGNOSIS — N200 Calculus of kidney: Secondary | ICD-10-CM | POA: Diagnosis not present

## 2016-10-29 DIAGNOSIS — N138 Other obstructive and reflux uropathy: Secondary | ICD-10-CM | POA: Diagnosis not present

## 2016-11-21 DIAGNOSIS — E079 Disorder of thyroid, unspecified: Secondary | ICD-10-CM | POA: Diagnosis not present

## 2016-11-21 DIAGNOSIS — R011 Cardiac murmur, unspecified: Secondary | ICD-10-CM | POA: Diagnosis not present

## 2016-11-21 DIAGNOSIS — I495 Sick sinus syndrome: Secondary | ICD-10-CM | POA: Diagnosis not present

## 2016-11-21 DIAGNOSIS — I10 Essential (primary) hypertension: Secondary | ICD-10-CM | POA: Diagnosis not present

## 2016-11-21 DIAGNOSIS — I442 Atrioventricular block, complete: Secondary | ICD-10-CM | POA: Diagnosis not present

## 2016-11-21 DIAGNOSIS — Z950 Presence of cardiac pacemaker: Secondary | ICD-10-CM | POA: Diagnosis not present

## 2016-11-21 DIAGNOSIS — E784 Other hyperlipidemia: Secondary | ICD-10-CM | POA: Diagnosis not present

## 2016-11-21 DIAGNOSIS — I951 Orthostatic hypotension: Secondary | ICD-10-CM | POA: Diagnosis not present

## 2016-12-10 ENCOUNTER — Encounter: Payer: Self-pay | Admitting: Family Medicine

## 2016-12-10 ENCOUNTER — Ambulatory Visit (INDEPENDENT_AMBULATORY_CARE_PROVIDER_SITE_OTHER): Payer: Medicare HMO | Admitting: Family Medicine

## 2016-12-10 VITALS — BP 110/62 | HR 68 | Temp 97.9°F | Resp 16 | Wt 173.0 lb

## 2016-12-10 DIAGNOSIS — E1140 Type 2 diabetes mellitus with diabetic neuropathy, unspecified: Secondary | ICD-10-CM | POA: Diagnosis not present

## 2016-12-10 DIAGNOSIS — I442 Atrioventricular block, complete: Secondary | ICD-10-CM | POA: Diagnosis not present

## 2016-12-10 DIAGNOSIS — I10 Essential (primary) hypertension: Secondary | ICD-10-CM

## 2016-12-10 DIAGNOSIS — G629 Polyneuropathy, unspecified: Secondary | ICD-10-CM | POA: Diagnosis not present

## 2016-12-10 LAB — POCT GLYCOSYLATED HEMOGLOBIN (HGB A1C)
Est. average glucose Bld gHb Est-mCnc: 131
HEMOGLOBIN A1C: 6.2

## 2016-12-10 NOTE — Progress Notes (Signed)
Patient: Todd Ortega Male    DOB: Oct 18, 1934   82 y.o.   MRN: [id number] Visit Date: 12/10/2016  Today's Provider: [name], MD   Chief Complaint  Patient presents with  . Hypertension  . Diabetes   Subjective:    HPI      Hypertension, follow-up:  BP Readings from Last 3 Encounters:  12/10/16 110/62  09/10/16 (!) 92/50  05/06/16 (!) 92/54    He was last seen for hypertension 3 months ago.  BP at that visit was 92/50. Management since that visit includes decreasing Lisinopril to 20 mg due to hypotension. He has since been seen by Dr. Clayborn Bigness, who decreased the lisinopril to 10 mg. He reports good compliance with treatment. He is not having side effects.  He is not exercising. He is adherent to low salt diet.   Outside blood pressures are 110's/60's. He is experiencing none.  Patient denies chest pain, chest pressure/discomfort, claudication, dyspnea, exertional chest pressure/discomfort, fatigue, irregular heart beat, lower extremity edema, near-syncope, orthopnea, palpitations and syncope.   Cardiovascular risk factors include advanced age (older than 54 for men, 60 for women), diabetes mellitus, dyslipidemia, hypertension and male gender.    Weight trend: stable Wt Readings from Last 3 Encounters:  12/10/16 173 lb (78.5 kg)  09/10/16 173 lb (78.5 kg)  05/06/16 173 lb (78.5 kg)    Current diet: in general, a "healthy" diet    ------------------------------------------------------------------------   Diabetes Mellitus Type II, Follow-up:   Lab Results  Component Value Date   HGBA1C 6.4 09/10/2016   HGBA1C 6.1 05/06/2016   HGBA1C 6.1 12/14/2014    Last seen for diabetes 3 months ago.  Management since then includes none. He reports good compliance with treatment. He is not having side effects.  Current symptoms include paresthesia of the feet and have been unchanged. Home blood sugar records: not being checked  Episodes of  hypoglycemia? no   Current Insulin Regimen: N/A Most Recent Eye Exam: due  Pertinent Labs:    Component Value Date/Time   CHOL 155 05/07/2016 0847   TRIG 40 05/07/2016 0847   HDL 67 05/07/2016 0847   LDLCALC 80 05/07/2016 0847   CREATININE 0.86 05/07/2016 0847    ------------------------------------------------------------------------   Allergies  Allergen Reactions  . Venlafaxine     Other reaction(s): Other (See Comments) Sweating, stomach upset, constipation     Current Outpatient Prescriptions:  .  acetaminophen (TYLENOL) 325 MG tablet, Take 650 mg by mouth every 6 (six) hours as needed., Disp: , Rfl:  .  aspirin 81 MG tablet, Take 81 mg by mouth daily. , Disp: , Rfl:  .  Calcium Carb-Cholecalciferol (CALCIUM 500+D3 PO), Take 1 tablet by mouth daily., Disp: , Rfl:  .  Cholecalciferol (VITAMIN D3) 2000 UNITS capsule, Take 2,000 Units by mouth daily. , Disp: , Rfl:  .  cyanocobalamin 100 MCG tablet, Take 100 mcg by mouth daily., Disp: , Rfl:  .  finasteride (PROSCAR) 5 MG tablet, Take 5 mg by mouth at bedtime. , Disp: , Rfl:  .  gabapentin (NEURONTIN) 300 MG capsule, Take 1 capsule by mouth daily., Disp: , Rfl:  .  glucose blood test strip, BAYER CONTOUR TEST (In Vitro Strip)  1 (one) Strip Strip check sugars once daily, or as needed for 0 days  Quantity: 300;  Refills: 4   Ordered :31-Mar-2013  [name] ;  Started 31-Mar-2013 Active Comments: Medication taken as needed., Disp: , Rfl:  .  levothyroxine (SYNTHROID, LEVOTHROID) 175 MCG tablet, Take 1 tablet (175 mcg total) by mouth daily before breakfast., Disp: 30 tablet, Rfl: 6 .  lisinopril (PRINIVIL,ZESTRIL) 10 MG tablet, Take 10 mg by mouth daily. , Disp: , Rfl:  .  simvastatin (ZOCOR) 40 MG tablet, Take 1 tablet daily, Disp: 90 tablet, Rfl: 3 .  tamsulosin (FLOMAX) 0.4 MG CAPS capsule, Take 0.4 mg by mouth daily after breakfast. , Disp: , Rfl:  .  FLUZONE HIGH-DOSE 0.5 ML SUSY, ADM 0.5ML IM UTD, Disp: , Rfl: 0 .   methimazole (TAPAZOLE) 5 MG tablet, Take 2.5 mg by mouth daily. , Disp: , Rfl:   Review of Systems  Constitutional: Negative for activity change, appetite change, chills, diaphoresis, fatigue, fever and unexpected weight change.  HENT: Negative.   Eyes: Negative.   Respiratory: Negative for cough and shortness of breath.   Cardiovascular: Negative for chest pain, palpitations and leg swelling.  Gastrointestinal: Negative.   Endocrine: Negative for cold intolerance, heat intolerance, polydipsia, polyphagia and polyuria.  Allergic/Immunologic: Negative.   Neurological: Positive for numbness.       Numbness of feet.  Psychiatric/Behavioral: Negative.     Social History  Substance Use Topics  . Smoking status: Never Smoker  . Smokeless tobacco: Never Used  . Alcohol use No   Objective:   BP 110/62 (BP Location: Right Arm, Patient Position: Sitting, Cuff Size: Normal)   Pulse 68   Temp 97.9 F (36.6 C) (Oral)   Resp 16   Wt 173 lb (78.5 kg)   BMI 27.92 kg/m  Vitals:   12/10/16 1518  BP: 110/62  Pulse: 68  Resp: 16  Temp: 97.9 F (36.6 C)  TempSrc: Oral  Weight: 173 lb (78.5 kg)     Physical Exam  Constitutional: He is oriented to person, place, and time. He appears well-developed and well-nourished.  HENT:  Head: Normocephalic and atraumatic.  Right Ear: External ear normal.  Left Ear: External ear normal.  Nose: Nose normal.  Eyes: Conjunctivae are normal. No scleral icterus.  Neck: Normal range of motion. No thyromegaly present.  Cardiovascular: Normal rate, regular rhythm and normal heart sounds.   Pulmonary/Chest: Effort normal and breath sounds normal. No respiratory distress.  Abdominal: Soft.  Neurological: He is alert and oriented to person, place, and time.  Mild drop foot right foot.decreased sensation R>L foot.  Skin: Skin is warm and dry.  Psychiatric: He has a normal mood and affect. His behavior is normal. Judgment and thought content normal.         Assessment & Plan:     1. Essential (primary) hypertension Improving. Continue Lisinopril 10 mg as prescribed by Dr. Clayborn Bigness. FU 4-5 months, and FU with cardiology as scheduled.   2. Type 2 diabetes mellitus with diabetic neuropathy, without long-term current use of insulin (HCC) Stable. Continue current medications. FU 4-5 months. - POCT glycosylated hemoglobin (Hb A1C)  Results for orders placed or performed in visit on 12/10/16  POCT glycosylated hemoglobin (Hb A1C)  Result Value Ref Range   Hemoglobin A1C 6.2    Est. average glucose Bld gHb Est-mCnc 131    3. Neuropathy Stable.  4. CHB (complete heart block) (HCC) Stable.      I have done the exam and reviewed the above chart and it is accurate to the best of my knowledge. Development worker, community has been used in this note in any air is in the dictation or transcription are unintentional.  [name], MD  [facility]

## 2017-02-06 DIAGNOSIS — R972 Elevated prostate specific antigen [PSA]: Secondary | ICD-10-CM | POA: Diagnosis not present

## 2017-04-03 ENCOUNTER — Other Ambulatory Visit: Payer: Self-pay

## 2017-04-03 MED ORDER — GABAPENTIN 300 MG PO CAPS: 300.0000 mg | ORAL_CAPSULE | Freq: Every day | ORAL | 3 refills | Status: DC

## 2017-04-09 ENCOUNTER — Telehealth: Payer: Self-pay | Admitting: Family Medicine

## 2017-04-14 ENCOUNTER — Ambulatory Visit: Payer: Medicare HMO | Admitting: Family Medicine

## 2017-04-14 ENCOUNTER — Ambulatory Visit (INDEPENDENT_AMBULATORY_CARE_PROVIDER_SITE_OTHER): Payer: Medicare HMO

## 2017-04-14 VITALS — BP 122/60 | HR 60 | Temp 98.4°F | Ht 66.0 in | Wt 177.4 lb

## 2017-04-14 VITALS — BP 122/60 | HR 60 | Temp 98.4°F | Wt 177.4 lb

## 2017-04-14 DIAGNOSIS — E1140 Type 2 diabetes mellitus with diabetic neuropathy, unspecified: Secondary | ICD-10-CM | POA: Diagnosis not present

## 2017-04-14 DIAGNOSIS — Z0000 Encounter for general adult medical examination without abnormal findings: Secondary | ICD-10-CM | POA: Diagnosis not present

## 2017-04-14 DIAGNOSIS — E7849 Other hyperlipidemia: Secondary | ICD-10-CM

## 2017-04-14 DIAGNOSIS — I10 Essential (primary) hypertension: Secondary | ICD-10-CM

## 2017-04-14 NOTE — Progress Notes (Signed)
Subjective:   Todd Ortega is a 82 y.o. male who presents for Medicare Annual/Subsequent preventive examination.  Review of Systems:  N/A  Cardiac Risk Factors include: advanced age (>27men, >60 women);diabetes mellitus;dyslipidemia;hypertension;male gender     Objective:    Vitals: BP 122/60 (BP Location: Left Arm)   Pulse 60   Temp 98.4 F (36.9 C) (Oral)   Ht 5\' 6"  (1.676 m)   Wt 177 lb 6.4 oz (80.5 kg)   BMI 28.63 kg/m   Body mass index is 28.63 kg/m.  Tobacco Social History   Tobacco Use  Smoking Status Never Smoker  Smokeless Tobacco Never Used     Counseling given: Not Answered   Past Medical History:  Diagnosis Date  . Arthritis   . BPH (benign prostatic hypertrophy)   . Chronic kidney disease   . Complete heart block ([place])   . Coronary artery disease   . Diabetes mellitus without complication ([place])   . Hyperlipidemia   . Hypertension   . Hyperthyroidism   . Nephrolithiasis   . Neuropathy   . Shortness of breath dyspnea   . Sleep apnea    Past Surgical History:  Procedure Laterality Date  . CARDIAC CATHETERIZATION    . HERNIA REPAIR Bilateral    Inguinal Hernia Repair  . MASS EXCISION     removed from left hand   Family History  Problem Relation Age of Onset  . Heart attack Mother   . Gout Mother   . Diabetes Mother   . Hypertension Mother   . Hyperlipidemia Mother   . Lung cancer Father   . Drug abuse Brother    Social History   Substance and Sexual Activity  Sexual Activity Not on file    Outpatient Encounter Medications as of 04/14/2017  Medication Sig  . acetaminophen (TYLENOL) 325 MG tablet Take 650 mg by mouth every 6 (six) hours as needed.  Marland Kitchen aspirin 81 MG tablet Take 81 mg by mouth daily.   . Calcium Carb-Cholecalciferol (CALCIUM 500+D3 PO) Take 1 tablet by mouth daily.  . Cholecalciferol (VITAMIN D3) 2000 UNITS capsule Take 2,000 Units by mouth daily.   . cyanocobalamin 100 MCG tablet Take 100 mcg by mouth daily.  .  finasteride (PROSCAR) 5 MG tablet Take 5 mg by mouth at bedtime.   . gabapentin (NEURONTIN) 300 MG capsule Take 1 capsule (300 mg total) by mouth daily.  Marland Kitchen glucose blood test strip BAYER CONTOUR TEST (In Vitro Strip)  1 (one) Strip Strip check sugars once daily, or as needed for 0 days  Quantity: 300;  Refills: 4   Ordered :31-Mar-2013  [name] ;  Started 31-Mar-2013 Active Comments: Medication taken as needed.  Marland Kitchen lisinopril (PRINIVIL,ZESTRIL) 10 MG tablet Take 10 mg by mouth daily.   . methimazole (TAPAZOLE) 5 MG tablet Take 2.5 mg by mouth daily.   . simvastatin (ZOCOR) 40 MG tablet Take 1 tablet daily  . tamsulosin (FLOMAX) 0.4 MG CAPS capsule Take 0.4 mg by mouth daily after breakfast.   . levothyroxine (SYNTHROID, LEVOTHROID) 175 MCG tablet Take 1 tablet (175 mcg total) by mouth daily before breakfast. (Patient not taking: Reported on 04/14/2017)  . [DISCONTINUED] FLUZONE HIGH-DOSE 0.5 ML SUSY ADM 0.5ML IM UTD   No facility-administered encounter medications on file as of 04/14/2017.     Activities of Daily Living In your present state of health, do you have any difficulty performing the following activities: 04/14/2017  Hearing? N  Vision? N  Difficulty concentrating  or making decisions? N  Walking or climbing stairs? Y  Comment due to neuropathy  Dressing or bathing? N  Doing errands, shopping? N  Preparing Food and eating ? N  Using the Toilet? N  In the past six months, have you accidently leaked urine? N  Do you have problems with loss of bowel control? N  Managing your Medications? N  Managing your Finances? N  Housekeeping or managing your Housekeeping? N  Some recent data might be hidden    Patient Care Team: [name]., MD as PCP - General (Family Medicine) Murrell Redden, MD as Consulting Physician (Urology) [name], MD as Consulting Physician (Cardiology)   Assessment:     Exercise Activities and Dietary recommendations Current  Exercise Habits: The patient does not participate in regular exercise at present, Exercise limited by: Other - see comments(has a part time job)  Goals    None     Fall Risk Fall Risk  04/14/2017 04/12/2016 04/18/2015 12/14/2014  Falls in the past year? No Yes No Yes  Number falls in past yr: - 2 or more - 1  Injury with Fall? - No - No  Risk Factor Category  - High Fall Risk - -  Comment - neuropathy - -   Depression Screen PHQ 2/9 Scores 04/14/2017 04/12/2016 04/18/2015  PHQ - 2 Score 0 0 0    Cognitive Function: Declined screening today.     6CIT Screen 04/12/2016  What Year? 0 points  What month? 0 points  What time? 0 points  Count back from 20 0 points  Months in reverse 0 points  Repeat phrase 2 points  Total Score 2    Immunization History  Administered Date(s) Administered  . Influenza, High Dose Seasonal PF 03/31/2016  . Influenza,inj,Quad PF,6+ Mos 04/21/2015  . Pneumococcal Conjugate-13 03/07/2014  . Pneumococcal Polysaccharide-23 12/04/2011  . Tdap 02/23/2011   Screening Tests Health Maintenance  Topic Date Due  . OPHTHALMOLOGY EXAM  10/01/2016  . HEMOGLOBIN A1C  06/12/2017  . FOOT EXAM  12/10/2017  . TETANUS/TDAP  02/22/2021  . INFLUENZA VACCINE  Completed  . PNA vac Low Risk Adult  Completed      Plan:  I have personally reviewed and addressed the Medicare Annual Wellness questionnaire and have noted the following in the patient's chart:  A. Medical and social history B. Use of alcohol, tobacco or illicit drugs  C. Current medications and supplements D. Functional ability and status E.  Nutritional status F.  Physical activity G. Advance directives H. List of other physicians I.  Hospitalizations, surgeries, and ER visits in previous 12 months J.  [place] such as hearing and vision if needed, cognitive and depression L. Referrals and appointments - none  In addition, I have reviewed and discussed with patient certain  preventive protocols, quality metrics, and best practice recommendations. A written personalized care plan for preventive services as well as general preventive health recommendations were provided to patient.  See attached scanned questionnaire for additional information.   Signed,  Fabio Neighbors, LPN Nurse Health Advisor   MD Recommendations: Pt is due for an eye exam and states that he plans to set this up in the beginning of 2019.

## 2017-04-14 NOTE — Patient Instructions (Signed)
Todd Ortega , Thank you for taking time to come for your Medicare Wellness Visit. I appreciate your ongoing commitment to your health goals. Please review the following plan we discussed and let me know if I can assist you in the future.   Screening recommendations/referrals: Colonoscopy: Up to date Mammogram: Up to date Bone Density: Up to date Recommended yearly ophthalmology/optometry visit for glaucoma screening and checkup Recommended yearly dental visit for hygiene and checkup  Vaccinations: Influenza vaccine: completed Pneumococcal vaccine: completed series Tdap vaccine: up to date Shingles vaccine: declined  Advanced directives: Please bring a copy of your POA (Power of Attorney) and/or Living Will to your next appointment.   Conditions/risks identified: Recommend increasing water intake to 4-6 glasses a day.   Next appointment: 3:30 PM today   Preventive Care 65 Years and Older, Male Preventive care refers to lifestyle choices and visits with your health care provider that can promote health and wellness. What does preventive care include?  A yearly physical exam. This is also called an annual well check.  Dental exams once or twice a year.  Routine eye exams. Ask your health care provider how often you should have your eyes checked.  Personal lifestyle choices, including:  Daily care of your teeth and gums.  Regular physical activity.  Eating a healthy diet.  Avoiding tobacco and drug use.  Limiting alcohol use.  Practicing safe sex.  Taking low-dose aspirin every day.  Taking vitamin and mineral supplements as recommended by your health care provider. What happens during an annual well check? The services and screenings done by your health care provider during your annual well check will depend on your age, overall health, lifestyle risk factors, and family history of disease. Counseling  Your health care provider may ask you questions about  your:  Alcohol use.  Tobacco use.  Drug use.  Emotional well-being.  Home and relationship well-being.  Sexual activity.  Eating habits.  History of falls.  Memory and ability to understand (cognition).  Work and work Statistician.  Reproductive health. Screening  You may have the following tests or measurements:  Height, weight, and BMI.  Blood pressure.  Lipid and cholesterol levels. These may be checked every 5 years, or more frequently if you are over 23 years old.  Skin check.  Lung cancer screening. You may have this screening every year starting at age 41 if you have a 30-pack-year history of smoking and currently smoke or have quit within the past 15 years.  Fecal occult blood test (FOBT) of the stool. You may have this test every year starting at age 78.  Flexible sigmoidoscopy or colonoscopy. You may have a sigmoidoscopy every 5 years or a colonoscopy every 10 years starting at age 101.  Hepatitis C blood test.  Hepatitis B blood test.  Sexually transmitted disease (STD) testing.  Diabetes screening. This is done by checking your blood sugar (glucose) after you have not eaten for a while (fasting). You may have this done every 1-3 years.  Bone density scan. This is done to screen for osteoporosis. You may have this done starting at age 76.  Mammogram. This may be done every 1-2 years. Talk to your health care provider about how often you should have regular mammograms. Talk with your health care provider about your test results, treatment options, and if necessary, the need for more tests. Vaccines  Your health care provider may recommend certain vaccines, such as:  Influenza vaccine. This is recommended every year.  Tetanus, diphtheria, and acellular pertussis (Tdap, Td) vaccine. You may need a Td booster every 10 years.  Zoster vaccine. You may need this after age 82.  Pneumococcal 13-valent conjugate (PCV13) vaccine. One dose is recommended  after age 74.  Pneumococcal polysaccharide (PPSV23) vaccine. One dose is recommended after age 59. Talk to your health care provider about which screenings and vaccines you need and how often you need them. This information is not intended to replace advice given to you by your health care provider. Make sure you discuss any questions you have with your health care provider. Document Released: 06/16/2015 Document Revised: 02/07/2016 Document Reviewed: 03/21/2015 Elsevier Interactive Patient Education  2017 [place] Prevention in the Home Falls can cause injuries. They can happen to people of all ages. There are many things you can do to make your home safe and to help prevent falls. What can I do on the outside of my home?  Regularly fix the edges of walkways and driveways and fix any cracks.  Remove anything that might make you trip as you walk through a door, such as a raised step or threshold.  Trim any bushes or trees on the path to your home.  Use bright outdoor lighting.  Clear any walking paths of anything that might make someone trip, such as rocks or tools.  Regularly check to see if handrails are loose or broken. Make sure that both sides of any steps have handrails.  Any raised decks and porches should have guardrails on the edges.  Have any leaves, snow, or ice cleared regularly.  Use sand or salt on walking paths during winter.  Clean up any spills in your garage right away. This includes oil or grease spills. What can I do in the bathroom?  Use night lights.  Install grab bars by the toilet and in the tub and shower. Do not use towel bars as grab bars.  Use non-skid mats or decals in the tub or shower.  If you need to sit down in the shower, use a plastic, non-slip stool.  Keep the floor dry. Clean up any water that spills on the floor as soon as it happens.  Remove soap buildup in the tub or shower regularly.  Attach bath mats securely with  double-sided non-slip rug tape.  Do not have throw rugs and other things on the floor that can make you trip. What can I do in the bedroom?  Use night lights.  Make sure that you have a light by your bed that is easy to reach.  Do not use any sheets or blankets that are too big for your bed. They should not hang down onto the floor.  Have a firm chair that has side arms. You can use this for support while you get dressed.  Do not have throw rugs and other things on the floor that can make you trip. What can I do in the kitchen?  Clean up any spills right away.  Avoid walking on wet floors.  Keep items that you use a lot in easy-to-reach places.  If you need to reach something above you, use a strong step stool that has a grab bar.  Keep electrical cords out of the way.  Do not use floor polish or wax that makes floors slippery. If you must use wax, use non-skid floor wax.  Do not have throw rugs and other things on the floor that can make you trip. What can I do  with my stairs?  Do not leave any items on the stairs.  Make sure that there are handrails on both sides of the stairs and use them. Fix handrails that are broken or loose. Make sure that handrails are as long as the stairways.  Check any carpeting to make sure that it is firmly attached to the stairs. Fix any carpet that is loose or worn.  Avoid having throw rugs at the top or bottom of the stairs. If you do have throw rugs, attach them to the floor with carpet tape.  Make sure that you have a light switch at the top of the stairs and the bottom of the stairs. If you do not have them, ask someone to add them for you. What else can I do to help prevent falls?  Wear shoes that:  Do not have high heels.  Have rubber bottoms.  Are comfortable and fit you well.  Are closed at the toe. Do not wear sandals.  If you use a stepladder:  Make sure that it is fully opened. Do not climb a closed stepladder.  Make  sure that both sides of the stepladder are locked into place.  Ask someone to hold it for you, if possible.  Clearly mark and make sure that you can see:  Any grab bars or handrails.  First and last steps.  Where the edge of each step is.  Use tools that help you move around (mobility aids) if they are needed. These include:  Canes.  Walkers.  Scooters.  Crutches.  Turn on the lights when you go into a dark area. Replace any light bulbs as soon as they burn out.  Set up your furniture so you have a clear path. Avoid moving your furniture around.  If any of your floors are uneven, fix them.  If there are any pets around you, be aware of where they are.  Review your medicines with your doctor. Some medicines can make you feel dizzy. This can increase your chance of falling. Ask your doctor what other things that you can do to help prevent falls. This information is not intended to replace advice given to you by your health care provider. Make sure you discuss any questions you have with your health care provider. Document Released: 03/16/2009 Document Revised: 10/26/2015 Document Reviewed: 06/24/2014 Elsevier Interactive Patient Education  2017 [employer].

## 2017-04-14 NOTE — Progress Notes (Signed)
Patient: Todd Ortega Male    DOB: 1935/03/29   82 y.o.   MRN: [id number] Visit Date: 04/14/2017  Today's Provider: [name], MD   Chief Complaint  Patient presents with  . Hypertension  . Diabetes   Subjective:    HPI  Hypertension, follow-up:  BP Readings from Last 3 Encounters:  04/14/17 122/60  04/14/17 122/60  12/10/16 110/62    He was last seen for hypertension 4 months ago.  BP at that visit was 110/62. Management since that visit includes none. He reports good compliance with treatment. He is not having side effects.  He is not exercising. He is adherent to low salt diet.   Outside blood pressures are 110-120/60-70. Patient denies chest pain, chest pressure/discomfort, claudication, dyspnea, exertional chest pressure/discomfort, fatigue, irregular heart beat, lower extremity edema, near-syncope, orthopnea, palpitations, paroxysmal nocturnal dyspnea, syncope and tachypnea.    Wt Readings from Last 3 Encounters:  04/14/17 177 lb 6.4 oz (80.5 kg)  04/14/17 177 lb 6.4 oz (80.5 kg)  12/10/16 173 lb (78.5 kg)   ------------------------------------------------------------------------   Diabetes Mellitus Type II, Follow-up:   Lab Results  Component Value Date   HGBA1C 6.2 12/10/2016   HGBA1C 6.4 09/10/2016   HGBA1C 6.1 05/06/2016    Last seen for diabetes 4 months ago.  Management since then includes none. He reports good compliance with treatment. He is not having side effects.    Most Recent Eye Exam: 2018 Weight trend: stable Prior visit with dietician: yes -  Current diet: in general, a "healthy" diet   Current exercise: walking  Pertinent Labs:    Component Value Date/Time   CHOL 155 05/07/2016 0847   TRIG 40 05/07/2016 0847   HDL 67 05/07/2016 0847   LDLCALC 80 05/07/2016 0847   CREATININE 0.86 05/07/2016 0847    Wt Readings from Last 3 Encounters:  04/14/17 177 lb 6.4 oz (80.5 kg)  04/14/17 177 lb 6.4 oz (80.5 kg)    12/10/16 173 lb (78.5 kg)    ------------------------------------------------------------------------ He reports that after he has worked his 4 hours for the day he has pain in his right leg. It is also the same leg that he has neuropathy in. He describes it as an achy pain.      Allergies  Allergen Reactions  . Venlafaxine     Other reaction(s): Other (See Comments) Sweating, stomach upset, constipation     Current Outpatient Medications:  .  acetaminophen (TYLENOL) 325 MG tablet, Take 650 mg by mouth every 6 (six) hours as needed., Disp: , Rfl:  .  aspirin 81 MG tablet, Take 81 mg by mouth daily. , Disp: , Rfl:  .  Calcium Carb-Cholecalciferol (CALCIUM 500+D3 PO), Take 1 tablet by mouth daily., Disp: , Rfl:  .  Cholecalciferol (VITAMIN D3) 2000 UNITS capsule, Take 2,000 Units by mouth daily. , Disp: , Rfl:  .  cyanocobalamin 100 MCG tablet, Take 100 mcg by mouth daily., Disp: , Rfl:  .  finasteride (PROSCAR) 5 MG tablet, Take 5 mg by mouth at bedtime. , Disp: , Rfl:  .  gabapentin (NEURONTIN) 300 MG capsule, Take 1 capsule (300 mg total) by mouth daily., Disp: 90 capsule, Rfl: 3 .  glucose blood test strip, BAYER CONTOUR TEST (In Vitro Strip)  1 (one) Strip Strip check sugars once daily, or as needed for 0 days  Quantity: 300;  Refills: 4   Ordered :31-Mar-2013  [name] ;  Started 31-Mar-2013 Active  Comments: Medication taken as needed., Disp: , Rfl:  .  levothyroxine (SYNTHROID, LEVOTHROID) 175 MCG tablet, Take 1 tablet (175 mcg total) by mouth daily before breakfast., Disp: 30 tablet, Rfl: 6 .  lisinopril (PRINIVIL,ZESTRIL) 10 MG tablet, Take 10 mg by mouth daily. , Disp: , Rfl:  .  methimazole (TAPAZOLE) 5 MG tablet, Take 2.5 mg by mouth daily. , Disp: , Rfl:  .  simvastatin (ZOCOR) 40 MG tablet, Take 1 tablet daily, Disp: 90 tablet, Rfl: 3 .  tamsulosin (FLOMAX) 0.4 MG CAPS capsule, Take 0.4 mg by mouth daily after breakfast. , Disp: , Rfl:   Review of Systems   Constitutional: Positive for activity change.  HENT: Negative.   Eyes: Negative.   Respiratory: Negative.   Cardiovascular: Negative.   Gastrointestinal: Negative.   Endocrine: Negative.   Genitourinary: Negative.   Musculoskeletal: Positive for arthralgias and myalgias.  Skin: Negative.   Allergic/Immunologic: Negative.   Neurological: Negative.   Hematological: Negative.   Psychiatric/Behavioral: Negative.     Social History   Tobacco Use  . Smoking status: Never Smoker  . Smokeless tobacco: Never Used  Substance Use Topics  . Alcohol use: No    Alcohol/week: 0.0 oz   Objective:   BP 122/60   Pulse 60   Temp 98.4 F (36.9 C) (Oral)   Wt 177 lb 6.4 oz (80.5 kg)   BMI 28.63 kg/m  Vitals:   04/14/17 1542  BP: 122/60  Pulse: 60  Temp: 98.4 F (36.9 C)  TempSrc: Oral  Weight: 177 lb 6.4 oz (80.5 kg)     Physical Exam  Constitutional: He is oriented to person, place, and time. He appears well-developed and well-nourished.  Eyes: Conjunctivae and EOM are normal. Pupils are equal, round, and reactive to light.  Neck: Normal range of motion. Neck supple.  Cardiovascular: Normal rate, regular rhythm, normal heart sounds and intact distal pulses.  Pulmonary/Chest: Effort normal and breath sounds normal.  Musculoskeletal: Normal range of motion.  Neurological: He is alert and oriented to person, place, and time. He has normal reflexes.  Skin: Skin is warm and dry.  Psychiatric: He has a normal mood and affect. His behavior is normal. Judgment and thought content normal.        Assessment & Plan:     1. Type 2 diabetes mellitus with diabetic neuropathy, without long-term current use of insulin (HCC)  - Comprehensive metabolic panel - Hemoglobin A1c  2. Essential (primary) hypertension  - CBC with Differential/Platelet - TSH  3. Other hyperlipidemia  - Lipid panel - Comprehensive metabolic panel 4.CAD 5.Pacemaker for Heart Block    HPI, Exam, and A&P  Transcribed under the direction and in the presence of Karlis Cregg L. Cranford Mon, MD  Electronically Signed: Katina Dung, [place], MD  [facility]

## 2017-04-15 DIAGNOSIS — E1140 Type 2 diabetes mellitus with diabetic neuropathy, unspecified: Secondary | ICD-10-CM | POA: Diagnosis not present

## 2017-04-15 DIAGNOSIS — I10 Essential (primary) hypertension: Secondary | ICD-10-CM | POA: Diagnosis not present

## 2017-04-15 DIAGNOSIS — E7849 Other hyperlipidemia: Secondary | ICD-10-CM | POA: Diagnosis not present

## 2017-04-16 ENCOUNTER — Telehealth: Payer: Self-pay | Admitting: Family Medicine

## 2017-04-16 LAB — COMPLETE METABOLIC PANEL WITH GFR
AG RATIO: 1.2 (calc) (ref 1.0–2.5)
ALT: 31 U/L (ref 9–46)
AST: 31 U/L (ref 10–35)
Albumin: 3.7 g/dL (ref 3.6–5.1)
Alkaline phosphatase (APISO): 83 U/L (ref 40–115)
BUN: 16 mg/dL (ref 7–25)
CALCIUM: 9 mg/dL (ref 8.6–10.3)
CHLORIDE: 103 mmol/L (ref 98–110)
CO2: 27 mmol/L (ref 20–32)
Creat: 0.75 mg/dL (ref 0.70–1.11)
GFR, EST AFRICAN AMERICAN: 100 mL/min/{1.73_m2} (ref >=60)
GFR, EST NON AFRICAN AMERICAN: 87 mL/min/{1.73_m2} (ref >=60)
GLUCOSE: 166 mg/dL — AB (ref 65–99)
Globulin: 3 g/dL (calc) (ref 1.9–3.7)
Potassium: 4 mmol/L (ref 3.5–5.3)
Sodium: 135 mmol/L (ref 135–146)
TOTAL PROTEIN: 6.7 g/dL (ref 6.1–8.1)
Total Bilirubin: 0.4 mg/dL (ref 0.2–1.2)

## 2017-04-16 LAB — LIPID PANEL
Cholesterol: 160 mg/dL (ref <200)
HDL: 69 mg/dL (ref >40)
LDL Cholesterol (Calc): 78 mg/dL (calc)
NON-HDL CHOLESTEROL (CALC): 91 mg/dL (ref <130)
Total CHOL/HDL Ratio: 2.3 (calc) (ref <5.0)
Triglycerides: 54 mg/dL (ref <150)

## 2017-04-16 LAB — CBC WITH DIFFERENTIAL/PLATELET
Basophils Absolute: 30 cells/uL (ref 0–200)
Basophils Relative: 0.7 %
EOS ABS: 52 {cells}/uL (ref 15–500)
EOS PCT: 1.2 %
HCT: 42.1 % (ref 38.5–50.0)
HEMOGLOBIN: 14.4 g/dL (ref 13.2–17.1)
Lymphs Abs: 2068 cells/uL (ref 850–3900)
MCH: 31.4 pg (ref 27.0–33.0)
MCHC: 34.2 g/dL (ref 32.0–36.0)
MCV: 91.7 fL (ref 80.0–100.0)
MPV: 9.9 fL (ref 7.5–12.5)
Monocytes Relative: 6.7 %
NEUTROS ABS: 1862 {cells}/uL (ref 1500–7800)
Neutrophils Relative %: 43.3 %
PLATELETS: 183 10*3/uL (ref 140–400)
RBC: 4.59 10*6/uL (ref 4.20–5.80)
RDW: 12.7 % (ref 11.0–15.0)
TOTAL LYMPHOCYTE: 48.1 %
WBC mixed population: 288 cells/uL (ref 200–950)
WBC: 4.3 10*3/uL (ref 3.8–10.8)

## 2017-04-16 LAB — HEMOGLOBIN A1C
HEMOGLOBIN A1C: 6.1 %{Hb} — AB (ref <5.7)
Mean Plasma Glucose: 128 (calc)
eAG (mmol/L): 7.1 (calc)

## 2017-04-16 LAB — TSH: TSH: 0.43 m[IU]/L (ref 0.40–4.50)

## 2017-04-16 NOTE — Telephone Encounter (Signed)
-----   Message from [name]., MD sent at 04/16/2017  8:05 AM EST ----- Labs okay

## 2017-04-16 NOTE — Telephone Encounter (Signed)
Pt is returning call.  CB#[phone]/MW

## 2017-04-16 NOTE — Telephone Encounter (Signed)
Patient advised.

## 2017-05-16 ENCOUNTER — Other Ambulatory Visit: Payer: Self-pay | Admitting: Family Medicine

## 2017-05-22 DIAGNOSIS — Z950 Presence of cardiac pacemaker: Secondary | ICD-10-CM | POA: Diagnosis not present

## 2017-05-22 DIAGNOSIS — I442 Atrioventricular block, complete: Secondary | ICD-10-CM | POA: Diagnosis not present

## 2017-05-22 DIAGNOSIS — E7849 Other hyperlipidemia: Secondary | ICD-10-CM | POA: Diagnosis not present

## 2017-05-22 DIAGNOSIS — R011 Cardiac murmur, unspecified: Secondary | ICD-10-CM | POA: Diagnosis not present

## 2017-05-22 DIAGNOSIS — I10 Essential (primary) hypertension: Secondary | ICD-10-CM | POA: Diagnosis not present

## 2017-05-22 DIAGNOSIS — I495 Sick sinus syndrome: Secondary | ICD-10-CM | POA: Diagnosis not present

## 2017-05-22 DIAGNOSIS — I951 Orthostatic hypotension: Secondary | ICD-10-CM | POA: Diagnosis not present

## 2017-05-22 DIAGNOSIS — E079 Disorder of thyroid, unspecified: Secondary | ICD-10-CM | POA: Diagnosis not present

## 2017-05-29 DIAGNOSIS — N401 Enlarged prostate with lower urinary tract symptoms: Secondary | ICD-10-CM | POA: Diagnosis not present

## 2017-05-29 DIAGNOSIS — N200 Calculus of kidney: Secondary | ICD-10-CM | POA: Diagnosis not present

## 2017-05-29 DIAGNOSIS — R339 Retention of urine, unspecified: Secondary | ICD-10-CM | POA: Diagnosis not present

## 2017-05-29 DIAGNOSIS — R3129 Other microscopic hematuria: Secondary | ICD-10-CM | POA: Diagnosis not present

## 2017-05-29 DIAGNOSIS — N138 Other obstructive and reflux uropathy: Secondary | ICD-10-CM | POA: Diagnosis not present

## 2017-05-29 DIAGNOSIS — R972 Elevated prostate specific antigen [PSA]: Secondary | ICD-10-CM | POA: Diagnosis not present

## 2017-06-02 DIAGNOSIS — N134 Hydroureter: Secondary | ICD-10-CM | POA: Diagnosis not present

## 2017-06-02 DIAGNOSIS — N200 Calculus of kidney: Secondary | ICD-10-CM | POA: Diagnosis not present

## 2017-06-02 DIAGNOSIS — N202 Calculus of kidney with calculus of ureter: Secondary | ICD-10-CM | POA: Diagnosis not present

## 2017-06-02 DIAGNOSIS — R3129 Other microscopic hematuria: Secondary | ICD-10-CM | POA: Diagnosis not present

## 2017-06-20 DIAGNOSIS — N202 Calculus of kidney with calculus of ureter: Secondary | ICD-10-CM | POA: Diagnosis not present

## 2017-06-20 DIAGNOSIS — N201 Calculus of ureter: Secondary | ICD-10-CM | POA: Diagnosis not present

## 2017-06-20 DIAGNOSIS — N200 Calculus of kidney: Secondary | ICD-10-CM | POA: Diagnosis not present

## 2017-06-26 DIAGNOSIS — I10 Essential (primary) hypertension: Secondary | ICD-10-CM | POA: Diagnosis not present

## 2017-06-26 DIAGNOSIS — Z950 Presence of cardiac pacemaker: Secondary | ICD-10-CM | POA: Diagnosis not present

## 2017-06-26 DIAGNOSIS — G629 Polyneuropathy, unspecified: Secondary | ICD-10-CM | POA: Diagnosis not present

## 2017-06-26 DIAGNOSIS — Z01818 Encounter for other preprocedural examination: Secondary | ICD-10-CM | POA: Diagnosis not present

## 2017-06-26 DIAGNOSIS — E785 Hyperlipidemia, unspecified: Secondary | ICD-10-CM | POA: Diagnosis not present

## 2017-06-26 DIAGNOSIS — N200 Calculus of kidney: Secondary | ICD-10-CM | POA: Diagnosis not present

## 2017-07-07 DIAGNOSIS — Z7982 Long term (current) use of aspirin: Secondary | ICD-10-CM | POA: Diagnosis not present

## 2017-07-07 DIAGNOSIS — N202 Calculus of kidney with calculus of ureter: Secondary | ICD-10-CM | POA: Diagnosis not present

## 2017-07-07 DIAGNOSIS — N2882 Megaloureter: Secondary | ICD-10-CM | POA: Diagnosis not present

## 2017-07-07 DIAGNOSIS — N201 Calculus of ureter: Secondary | ICD-10-CM | POA: Diagnosis not present

## 2017-07-07 DIAGNOSIS — N35912 Unspecified bulbous urethral stricture, male: Secondary | ICD-10-CM | POA: Diagnosis not present

## 2017-07-07 DIAGNOSIS — N401 Enlarged prostate with lower urinary tract symptoms: Secondary | ICD-10-CM | POA: Diagnosis not present

## 2017-07-07 DIAGNOSIS — Z79899 Other long term (current) drug therapy: Secondary | ICD-10-CM | POA: Diagnosis not present

## 2017-07-07 DIAGNOSIS — Z888 Allergy status to other drugs, medicaments and biological substances status: Secondary | ICD-10-CM | POA: Diagnosis not present

## 2017-07-07 DIAGNOSIS — Z950 Presence of cardiac pacemaker: Secondary | ICD-10-CM | POA: Diagnosis not present

## 2017-07-07 DIAGNOSIS — N138 Other obstructive and reflux uropathy: Secondary | ICD-10-CM | POA: Diagnosis not present

## 2017-07-23 DIAGNOSIS — N200 Calculus of kidney: Secondary | ICD-10-CM | POA: Diagnosis not present

## 2017-07-23 DIAGNOSIS — T191XXA Foreign body in bladder, initial encounter: Secondary | ICD-10-CM | POA: Diagnosis not present

## 2017-08-02 DIAGNOSIS — N200 Calculus of kidney: Secondary | ICD-10-CM | POA: Diagnosis not present

## 2017-09-04 NOTE — Telephone Encounter (Signed)
complete

## 2017-10-14 ENCOUNTER — Encounter: Payer: Self-pay | Admitting: Family Medicine

## 2017-11-06 DIAGNOSIS — I495 Sick sinus syndrome: Secondary | ICD-10-CM | POA: Diagnosis not present

## 2017-11-06 DIAGNOSIS — R011 Cardiac murmur, unspecified: Secondary | ICD-10-CM | POA: Diagnosis not present

## 2017-11-06 DIAGNOSIS — I10 Essential (primary) hypertension: Secondary | ICD-10-CM | POA: Diagnosis not present

## 2017-11-06 DIAGNOSIS — E7849 Other hyperlipidemia: Secondary | ICD-10-CM | POA: Diagnosis not present

## 2017-11-06 DIAGNOSIS — Z950 Presence of cardiac pacemaker: Secondary | ICD-10-CM | POA: Diagnosis not present

## 2017-11-06 DIAGNOSIS — I442 Atrioventricular block, complete: Secondary | ICD-10-CM | POA: Diagnosis not present

## 2017-11-06 DIAGNOSIS — E079 Disorder of thyroid, unspecified: Secondary | ICD-10-CM | POA: Diagnosis not present

## 2017-11-06 DIAGNOSIS — I951 Orthostatic hypotension: Secondary | ICD-10-CM | POA: Diagnosis not present

## 2017-11-24 ENCOUNTER — Other Ambulatory Visit: Payer: Self-pay | Admitting: *Deleted

## 2017-11-24 NOTE — Telephone Encounter (Signed)
Patient states his urologist is no longer practicing in [place], he has moved. Patient states he is almost out of his finasteride 5 mg. Patient wanted to know if Dr. Rosanna Randy would refill mediation for him for now? Patient would rx sent to [place] order pharmacy. Please advise?

## 2017-11-25 MED ORDER — FINASTERIDE 5 MG PO TABS: 5.0000 mg | ORAL_TABLET | Freq: Every day | ORAL | 3 refills | Status: DC

## 2017-11-25 NOTE — Telephone Encounter (Signed)
Please review

## 2018-02-12 ENCOUNTER — Ambulatory Visit (INDEPENDENT_AMBULATORY_CARE_PROVIDER_SITE_OTHER): Payer: Medicare HMO | Admitting: Family Medicine

## 2018-02-12 ENCOUNTER — Telehealth: Payer: Self-pay | Admitting: Family Medicine

## 2018-02-12 ENCOUNTER — Ambulatory Visit (INDEPENDENT_AMBULATORY_CARE_PROVIDER_SITE_OTHER): Payer: Medicare HMO

## 2018-02-12 VITALS — BP 114/72 | HR 67 | Temp 98.8°F | Ht 66.0 in | Wt 166.8 lb

## 2018-02-12 DIAGNOSIS — Z0000 Encounter for general adult medical examination without abnormal findings: Secondary | ICD-10-CM

## 2018-02-12 DIAGNOSIS — E0590 Thyrotoxicosis, unspecified without thyrotoxic crisis or storm: Secondary | ICD-10-CM

## 2018-02-12 DIAGNOSIS — G629 Polyneuropathy, unspecified: Secondary | ICD-10-CM | POA: Diagnosis not present

## 2018-02-12 DIAGNOSIS — I442 Atrioventricular block, complete: Secondary | ICD-10-CM | POA: Diagnosis not present

## 2018-02-12 DIAGNOSIS — M21371 Foot drop, right foot: Secondary | ICD-10-CM

## 2018-02-12 DIAGNOSIS — E1140 Type 2 diabetes mellitus with diabetic neuropathy, unspecified: Secondary | ICD-10-CM

## 2018-02-12 DIAGNOSIS — Z6826 Body mass index (BMI) 26.0-26.9, adult: Secondary | ICD-10-CM | POA: Diagnosis not present

## 2018-02-12 DIAGNOSIS — I10 Essential (primary) hypertension: Secondary | ICD-10-CM | POA: Diagnosis not present

## 2018-02-12 DIAGNOSIS — I2510 Atherosclerotic heart disease of native coronary artery without angina pectoris: Secondary | ICD-10-CM

## 2018-02-12 DIAGNOSIS — E7849 Other hyperlipidemia: Secondary | ICD-10-CM | POA: Diagnosis not present

## 2018-02-12 DIAGNOSIS — Z23 Encounter for immunization: Secondary | ICD-10-CM | POA: Diagnosis not present

## 2018-02-12 NOTE — Patient Instructions (Addendum)
Neuropathy Referred by to Neurology, Dr. Manuella Ghazi  -B12  Hyperthyroidism Referred back to Endocrinology, Dr. Honor Junes -TSH  Diabetes mellitus, type 2  -HgbA1c -Lipid Panel -CBC -METC  HLD (hyperlipidemia) -HgbA1c -Lipid Panel -CBC -METC  Essential (primary) hypertension -HgbA1c -Lipid Panel -CBC -METC  Return for follow up in 6 months

## 2018-02-12 NOTE — Patient Instructions (Addendum)
[name] , Thank you for taking time to come for your Medicare Wellness Visit. I appreciate your ongoing commitment to your health goals. Please review the following plan we discussed and let me know if I can assist you in the future.   Screening recommendations/referrals: Colonoscopy: Up to date Recommended yearly ophthalmology/optometry visit for glaucoma screening and checkup Recommended yearly dental visit for hygiene and checkup  Vaccinations: Influenza vaccine: Up to date Pneumococcal vaccine: Up to date Tdap vaccine: Up to date Shingles vaccine: Pt declines today.     Advanced directives: Please bring a copy of your POA (Power of Attorney) and/or Living Will to your next appointment.   Conditions/risks identified: Recommend to continue trying to increase water intake to 4-6 glasses a day.   Next appointment: 2:00 PM today with Dr Rosanna Randy.   Preventive Care 81 Years and Older, Male Preventive care refers to lifestyle choices and visits with your health care provider that can promote health and wellness. What does preventive care include?  A yearly physical exam. This is also called an annual well check.  Dental exams once or twice a year.  Routine eye exams. Ask your health care provider how often you should have your eyes checked.  Personal lifestyle choices, including:  Daily care of your teeth and gums.  Regular physical activity.  Eating a healthy diet.  Avoiding tobacco and drug use.  Limiting alcohol use.  Practicing safe sex.  Taking low doses of aspirin every day.  Taking vitamin and mineral supplements as recommended by your health care provider. What happens during an annual well check? The services and screenings done by your health care provider during your annual well check will depend on your age, overall health, lifestyle risk factors, and family history of disease. Counseling  Your health care provider may ask you questions about  your:  Alcohol use.  Tobacco use.  Drug use.  Emotional well-being.  Home and relationship well-being.  Sexual activity.  Eating habits.  History of falls.  Memory and ability to understand (cognition).  Work and work Statistician. Screening  You may have the following tests or measurements:  Height, weight, and BMI.  Blood pressure.  Lipid and cholesterol levels. These may be checked every 5 years, or more frequently if you are over 81 years old.  Skin check.  Lung cancer screening. You may have this screening every year starting at age 81 if you have a 30-pack-year history of smoking and currently smoke or have quit within the past 15 years.  Fecal occult blood test (FOBT) of the stool. You may have this test every year starting at age 81.  Flexible sigmoidoscopy or colonoscopy. You may have a sigmoidoscopy every 5 years or a colonoscopy every 10 years starting at age 81.  Prostate cancer screening. Recommendations will vary depending on your family history and other risks.  Hepatitis C blood test.  Hepatitis B blood test.  Sexually transmitted disease (STD) testing.  Diabetes screening. This is done by checking your blood sugar (glucose) after you have not eaten for a while (fasting). You may have this done every 1-3 years.  Abdominal aortic aneurysm (AAA) screening. You may need this if you are a current or former smoker.  Osteoporosis. You may be screened starting at age 81 if you are at high risk. Talk with your health care provider about your test results, treatment options, and if necessary, the need for more tests. Vaccines  Your health care provider may recommend certain  vaccines, such as:  Influenza vaccine. This is recommended every year.  Tetanus, diphtheria, and acellular pertussis (Tdap, Td) vaccine. You may need a Td booster every 10 years.  Zoster vaccine. You may need this after age 81.  Pneumococcal 13-valent conjugate (PCV13) vaccine.  One dose is recommended after age 81.  Pneumococcal polysaccharide (PPSV23) vaccine. One dose is recommended after age 81. Talk to your health care provider about which screenings and vaccines you need and how often you need them. This information is not intended to replace advice given to you by your health care provider. Make sure you discuss any questions you have with your health care provider. Document Released: 06/16/2015 Document Revised: 02/07/2016 Document Reviewed: 03/21/2015 Elsevier Interactive Patient Education  2017 [place] Prevention in the Home Falls can cause injuries. They can happen to people of all ages. There are many things you can do to make your home safe and to help prevent falls. What can I do on the outside of my home?  Regularly fix the edges of walkways and driveways and fix any cracks.  Remove anything that might make you trip as you walk through a door, such as a raised step or threshold.  Trim any bushes or trees on the path to your home.  Use bright outdoor lighting.  Clear any walking paths of anything that might make someone trip, such as rocks or tools.  Regularly check to see if handrails are loose or broken. Make sure that both sides of any steps have handrails.  Any raised decks and porches should have guardrails on the edges.  Have any leaves, snow, or ice cleared regularly.  Use sand or salt on walking paths during winter.  Clean up any spills in your garage right away. This includes oil or grease spills. What can I do in the bathroom?  Use night lights.  Install grab bars by the toilet and in the tub and shower. Do not use towel bars as grab bars.  Use non-skid mats or decals in the tub or shower.  If you need to sit down in the shower, use a plastic, non-slip stool.  Keep the floor dry. Clean up any water that spills on the floor as soon as it happens.  Remove soap buildup in the tub or shower regularly.  Attach bath  mats securely with double-sided non-slip rug tape.  Do not have throw rugs and other things on the floor that can make you trip. What can I do in the bedroom?  Use night lights.  Make sure that you have a light by your bed that is easy to reach.  Do not use any sheets or blankets that are too big for your bed. They should not hang down onto the floor.  Have a firm chair that has side arms. You can use this for support while you get dressed.  Do not have throw rugs and other things on the floor that can make you trip. What can I do in the kitchen?  Clean up any spills right away.  Avoid walking on wet floors.  Keep items that you use a lot in easy-to-reach places.  If you need to reach something above you, use a strong step stool that has a grab bar.  Keep electrical cords out of the way.  Do not use floor polish or wax that makes floors slippery. If you must use wax, use non-skid floor wax.  Do not have throw rugs and other things  on the floor that can make you trip. What can I do with my stairs?  Do not leave any items on the stairs.  Make sure that there are handrails on both sides of the stairs and use them. Fix handrails that are broken or loose. Make sure that handrails are as long as the stairways.  Check any carpeting to make sure that it is firmly attached to the stairs. Fix any carpet that is loose or worn.  Avoid having throw rugs at the top or bottom of the stairs. If you do have throw rugs, attach them to the floor with carpet tape.  Make sure that you have a light switch at the top of the stairs and the bottom of the stairs. If you do not have them, ask someone to add them for you. What else can I do to help prevent falls?  Wear shoes that:  Do not have high heels.  Have rubber bottoms.  Are comfortable and fit you well.  Are closed at the toe. Do not wear sandals.  If you use a stepladder:  Make sure that it is fully opened. Do not climb a closed  stepladder.  Make sure that both sides of the stepladder are locked into place.  Ask someone to hold it for you, if possible.  Clearly mark and make sure that you can see:  Any grab bars or handrails.  First and last steps.  Where the edge of each step is.  Use tools that help you move around (mobility aids) if they are needed. These include:  Canes.  Walkers.  Scooters.  Crutches.  Turn on the lights when you go into a dark area. Replace any light bulbs as soon as they burn out.  Set up your furniture so you have a clear path. Avoid moving your furniture around.  If any of your floors are uneven, fix them.  If there are any pets around you, be aware of where they are.  Review your medicines with your doctor. Some medicines can make you feel dizzy. This can increase your chance of falling. Ask your doctor what other things that you can do to help prevent falls. This information is not intended to replace advice given to you by your health care provider. Make sure you discuss any questions you have with your health care provider. Document Released: 03/16/2009 Document Revised: 10/26/2015 Document Reviewed: 06/24/2014 Elsevier Interactive Patient Education  2017 [employer].

## 2018-02-12 NOTE — Progress Notes (Signed)
Subjective:   Cairo Agostinelli is a 83 y.o. male who presents for Medicare Annual/Subsequent preventive examination.  Review of Systems:  N/A  Cardiac Risk Factors include: advanced age (>34men, >46 women);diabetes mellitus;dyslipidemia;hypertension;male gender     Objective:    Vitals: BP 114/72 (BP Location: Right Arm)   Pulse 67   Temp 98.8 F (37.1 C) (Oral)   Ht 5\' 6"  (1.676 m)   Wt 166 lb 12.8 oz (75.7 kg)   BMI 26.92 kg/m   Body mass index is 26.92 kg/m.  Advanced Directives 02/12/2018 04/14/2017 04/12/2016 06/06/2015 04/20/2015 04/19/2015  Does Patient Have a Medical Advance Directive? Yes Yes Yes No No No  Type of Paramedic of [place];Living will Living will Living will - - -  Copy of [place] in Chart? No - copy requested - No - copy requested - - -  Would patient like information on creating a medical advance directive? - - - No - patient declined information No - patient declined information No - patient declined information    Tobacco Social History   Tobacco Use  Smoking Status Never Smoker  Smokeless Tobacco Never Used     Counseling given: Not Answered   Clinical Intake:  Pre-visit preparation completed: Yes  Pain : No/denies pain Pain Score: 0-No pain     Nutritional Status: BMI 25 -29 Overweight Nutritional Risks: None Diabetes: Yes(type 2) CBG done?: No Did pt. bring in CBG monitor from home?: No  How often do you need to have someone help you when you read instructions, pamphlets, or other written materials from your doctor or pharmacy?: 1 - Never     Information entered by :: [facility], LPN  Past Medical History:  Diagnosis Date  . Arthritis   . BPH (benign prostatic hypertrophy)   . Chronic kidney disease   . Complete heart block ([place])   . Coronary artery disease   . Diabetes mellitus without complication ([place])   . Foot drop, right foot   . Hyperlipidemia   . Hypertension   .  Hyperthyroidism   . Nephrolithiasis   . Neuropathy   . Shortness of breath dyspnea   . Sleep apnea    Past Surgical History:  Procedure Laterality Date  . CARDIAC CATHETERIZATION    . HERNIA REPAIR Bilateral    Inguinal Hernia Repair  . MASS EXCISION     removed from left hand  . PACEMAKER INSERTION N/A 04/20/2015   Procedure: INSERTION PACEMAKER;  Surgeon: Isaias Cowman, MD;  Location: [facility] ORS;  Service: Cardiovascular;  Laterality: N/A;   Family History  Problem Relation Age of Onset  . Heart attack Mother   . Gout Mother   . Diabetes Mother   . Hypertension Mother   . Hyperlipidemia Mother   . Lung cancer Father   . Drug abuse Brother    Social History   Socioeconomic History  . Marital status: Married    Spouse name: Not on file  . Number of children: 2  . Years of education: Not on file  . Highest education level: Some college, no degree  Occupational History  . Occupation: retired  Scientific laboratory technician  . Financial resource strain: Not hard at all  . Food insecurity:    Worry: Never true    Inability: Never true  . Transportation needs:    Medical: No    Non-medical: No  Tobacco Use  . Smoking status: Never Smoker  . Smokeless tobacco: Never Used  Substance and Sexual Activity  . Alcohol use: No    Alcohol/week: 0.0 standard drinks  . Drug use: No  . Sexual activity: Not on file  Lifestyle  . Physical activity:    Days per week: Not on file    Minutes per session: Not on file  . Stress: Not at all  Relationships  . Social connections:    Talks on phone: Not on file    Gets together: Not on file    Attends religious service: Not on file    Active member of club or organization: Not on file    Attends meetings of clubs or organizations: Not on file    Relationship status: Not on file  Other Topics Concern  . Not on file  Social History Narrative  . Not on file    Outpatient Encounter Medications as of 02/12/2018  Medication Sig  .  acetaminophen (TYLENOL) 325 MG tablet Take 650 mg by mouth every 6 (six) hours as needed.  Marland Kitchen aspirin 81 MG tablet Take 81 mg by mouth daily.   . Calcium Carb-Cholecalciferol (CALCIUM 500+D3 PO) Take 1 tablet by mouth daily.  . Cholecalciferol (VITAMIN D3) 2000 UNITS capsule Take 2,000 Units by mouth daily.   . cyanocobalamin 100 MCG tablet Take 100 mcg by mouth daily.  . finasteride (PROSCAR) 5 MG tablet Take 1 tablet (5 mg total) by mouth at bedtime.  . gabapentin (NEURONTIN) 300 MG capsule Take 1 capsule (300 mg total) by mouth daily.  Marland Kitchen glucose blood test strip BAYER CONTOUR TEST (In Vitro Strip)  1 (one) Strip Strip check sugars once daily, or as needed for 0 days  Quantity: 300;  Refills: 4   Ordered :31-Mar-2013  [name] ;  Started 31-Mar-2013 Active Comments: Medication taken as needed.  Marland Kitchen levothyroxine (SYNTHROID, LEVOTHROID) 175 MCG tablet Take 1 tablet (175 mcg total) by mouth daily before breakfast.  . lisinopril (PRINIVIL,ZESTRIL) 10 MG tablet Take 10 mg by mouth daily.   . simvastatin (ZOCOR) 40 MG tablet TAKE 1 TABLET EVERY DAY  . methimazole (TAPAZOLE) 5 MG tablet Take 2.5 mg by mouth daily.   . tamsulosin (FLOMAX) 0.4 MG CAPS capsule Take 0.4 mg by mouth daily after breakfast.    No facility-administered encounter medications on file as of 02/12/2018.     Activities of Daily Living In your present state of health, do you have any difficulty performing the following activities: 02/12/2018 04/14/2017  Hearing? N N  Vision? N N  Difficulty concentrating or making decisions? N N  Walking or climbing stairs? Y Y  Comment Due to neuropathy in feet.  due to neuropathy  Dressing or bathing? N N  Doing errands, shopping? N N  Preparing Food and eating ? N N  Using the Toilet? N N  In the past six months, have you accidently leaked urine? N N  Do you have problems with loss of bowel control? N N  Managing your Medications? N N  Managing your Finances? N N  Housekeeping or  managing your Housekeeping? N N  Some recent data might be hidden    Patient Care Team: [name]., MD as PCP - General (Family Medicine) [name], MD as Consulting Physician (Cardiology)   Assessment:   This is a routine wellness examination for Jaxtyn.  Exercise Activities and Dietary recommendations Current Exercise Habits: The patient does not participate in regular exercise at present, Exercise limited by: neurologic condition(s)  Goals    .  Increase water intake     Starting 04/12/16, I will increase my water intake to 6 glasses of water a day.    . Increase water intake     Recommend increasing water intake to 4-6 glasses a day.        Fall Risk Fall Risk  02/12/2018 04/14/2017 04/12/2016 04/18/2015 12/14/2014  Falls in the past year? Yes No Yes No Yes  Number falls in past yr: 2 or more - 2 or more - 1  Injury with Fall? No - No - No  Risk Factor Category  - - High Fall Risk - -  Comment - - neuropathy - -  Risk for fall due to : Other (Comment) - - - -  Risk for fall due to: Comment neuropathy - - - -  Follow up Falls prevention discussed - - - -   Is the patient's home free of loose throw rugs in walkways, pet beds, electrical cords, etc?   yes      Grab bars in the bathroom? no      Handrails on the stairs?   no      Adequate lighting?   yes  Timed Get Up and Go Performed: N/A  Depression Screen PHQ 2/9 Scores 02/12/2018 04/14/2017 04/12/2016 04/18/2015  PHQ - 2 Score 1 0 0 0    Cognitive Function: Pt declined screening today.      6CIT Screen 04/12/2016  What Year? 0 points  What month? 0 points  What time? 0 points  Count back from 20 0 points  Months in reverse 0 points  Repeat phrase 2 points  Total Score 2    Immunization History  Administered Date(s) Administered  . Influenza, High Dose Seasonal PF 03/31/2016, 02/12/2018  . Influenza,inj,Quad PF,6+ Mos 04/21/2015  . Pneumococcal Conjugate-13 03/07/2014  . Pneumococcal  Polysaccharide-23 12/04/2011  . Tdap 02/23/2011    Qualifies for Shingles Vaccine?  Due for Shingles vaccine. Declined my offer to administer today. Education has been provided regarding the importance of this vaccine. Pt has been advised to call her insurance company to determine her out of pocket expense. Advised she may also receive this vaccine at her local pharmacy or Health Dept. Verbalized acceptance and understanding.  Screening Tests Health Maintenance  Topic Date Due  . OPHTHALMOLOGY EXAM  10/01/2016  . HEMOGLOBIN A1C  10/13/2017  . FOOT EXAM  12/10/2017  . TETANUS/TDAP  02/22/2021  . INFLUENZA VACCINE  Completed  . PNA vac Low Risk Adult  Completed   Cancer Screenings: Lung: Low Dose CT Chest recommended if Age 36-80 years, 30 pack-year currently smoking OR have quit w/in 15years. Patient does qualify. Colorectal: Up to date  Additional Screenings:  Hepatitis C Screening: N/A      Plan:  I have personally reviewed and addressed the Medicare Annual Wellness questionnaire and have noted the following in the patient's chart:  A. Medical and social history B. Use of alcohol, tobacco or illicit drugs  C. Current medications and supplements D. Functional ability and status E.  Nutritional status F.  Physical activity G. Advance directives H. List of other physicians I.  Hospitalizations, surgeries, and ER visits in previous 12 months J.  [place] such as hearing and vision if needed, cognitive and depression L. Referrals and appointments - none  In addition, I have reviewed and discussed with patient certain preventive protocols, quality metrics, and best practice recommendations. A written personalized care plan for preventive services as well as general  preventive health recommendations were provided to patient.  See attached scanned questionnaire for additional information.   Signed,  Fabio Neighbors, LPN Nurse Health Advisor   Nurse  Recommendations: Pt needs a diabetic foot exam and Hgb A1c checked today. Pt plans to set up an eye exam in the near future.

## 2018-02-12 NOTE — Progress Notes (Signed)
Patient: Todd Ortega, Male    DOB: 1935/02/28, 83 y.o.   MRN: [id number] Visit Date: 02/12/2018  Today's Provider: [name], MD   Chief Complaint  Patient presents with  . Annual Exam   Subjective:   Patient saw McKenzie for AWV today at 1:20 pm.   Complete Physical Todd Ortega is a 83 y.o. male. He feels well. He reports exercising none. He reports he is sleeping fairly well. Pt is married and just retired completely from recent part time work. He is getting weaker and is having more difficulty with ambulation.  -----------------------------------------------------------   Review of Systems  Constitutional: Negative.   HENT: Negative.   Eyes: Negative.   Respiratory: Negative.   Cardiovascular: Negative.   Gastrointestinal: Negative.   Endocrine: Negative.   Genitourinary: Positive for difficulty urinating.  Allergic/Immunologic: Negative.   Neurological: Positive for weakness.  Psychiatric/Behavioral: Negative.   All other systems reviewed and are negative.   Social History   Socioeconomic History  . Marital status: Married    Spouse name: Not on file  . Number of children: 2  . Years of education: Not on file  . Highest education level: Some college, no degree  Occupational History  . Occupation: retired  Scientific laboratory technician  . Financial resource strain: Not hard at all  . Food insecurity:    Worry: Never true    Inability: Never true  . Transportation needs:    Medical: No    Non-medical: No  Tobacco Use  . Smoking status: Never Smoker  . Smokeless tobacco: Never Used  Substance and Sexual Activity  . Alcohol use: No    Alcohol/week: 0.0 standard drinks  . Drug use: No  . Sexual activity: Not on file  Lifestyle  . Physical activity:    Days per week: Not on file    Minutes per session: Not on file  . Stress: Not at all  Relationships  . Social connections:    Talks on phone: Not on file    Gets together: Not on file    Attends  religious service: Not on file    Active member of club or organization: Not on file    Attends meetings of clubs or organizations: Not on file    Relationship status: Not on file  . Intimate partner violence:    Fear of current or ex partner: Not on file    Emotionally abused: Not on file    Physically abused: Not on file    Forced sexual activity: Not on file  Other Topics Concern  . Not on file  Social History Narrative  . Not on file    Past Medical History:  Diagnosis Date  . Arthritis   . BPH (benign prostatic hypertrophy)   . Chronic kidney disease   . Complete heart block ([place])   . Coronary artery disease   . Diabetes mellitus without complication ([place])   . Foot drop, right foot   . Hyperlipidemia   . Hypertension   . Hyperthyroidism   . Nephrolithiasis   . Neuropathy   . Shortness of breath dyspnea   . Sleep apnea      Patient Active Problem List   Diagnosis Date Noted  . CHB (complete heart block) ([place]) 04/20/2015  . Benign fibroma of prostate 10/17/2014  . Arteriosclerosis of coronary artery 10/17/2014  . Essential (primary) hypertension 10/17/2014  . Enthesopathy 10/17/2014  . HLD (hyperlipidemia) 10/17/2014  . Hyperthyroidism 10/17/2014  .  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 Neuropathy 10/17/2014  . Apnea, sleep 10/17/2014  . Diabetes mellitus, type 2 ([place]) 10/17/2014  . Benign neoplasm of colon 10/19/2013  . BP (high blood pressure) 10/19/2013  . Benign prostatic hyperplasia with urinary obstruction 04/06/2013  . Calculus of kidney 04/06/2013  . Abnormal prostate specific antigen 04/06/2013  . Hematuria, microscopic 04/06/2013  . Incomplete bladder emptying 04/06/2013    Past Surgical History:  Procedure Laterality Date  . CARDIAC CATHETERIZATION    . HERNIA REPAIR Bilateral    Inguinal Hernia Repair  . MASS EXCISION     removed from left hand  . PACEMAKER INSERTION N/A 04/20/2015   Procedure: INSERTION PACEMAKER;   Surgeon: Isaias Cowman, MD;  Location: [facility] ORS;  Service: Cardiovascular;  Laterality: N/A;    His family history includes Diabetes in his mother; Drug abuse in his brother; Gout in his mother; Heart attack in his mother; Hyperlipidemia in his mother; Hypertension in his mother; Lung cancer in his father.      Current Outpatient Medications:  .  acetaminophen (TYLENOL) 325 MG tablet, Take 650 mg by mouth every 6 (six) hours as needed., Disp: , Rfl:  .  aspirin 81 MG tablet, Take 81 mg by mouth daily. , Disp: , Rfl:  .  Calcium Carb-Cholecalciferol (CALCIUM 500+D3 PO), Take 1 tablet by mouth daily., Disp: , Rfl:  .  Cholecalciferol (VITAMIN D3) 2000 UNITS capsule, Take 2,000 Units by mouth daily. , Disp: , Rfl:  .  cyanocobalamin 100 MCG tablet, Take 100 mcg by mouth daily., Disp: , Rfl:  .  finasteride (PROSCAR) 5 MG tablet, Take 1 tablet (5 mg total) by mouth at bedtime., Disp: 90 tablet, Rfl: 3 .  gabapentin (NEURONTIN) 300 MG capsule, Take 1 capsule (300 mg total) by mouth daily., Disp: 90 capsule, Rfl: 3 .  glucose blood test strip, BAYER CONTOUR TEST (In Vitro Strip)  1 (one) Strip Strip check sugars once daily, or as needed for 0 days  Quantity: 300;  Refills: 4   Ordered :31-Mar-2013  [name] ;  Started 31-Mar-2013 Active Comments: Medication taken as needed., Disp: , Rfl:  .  levothyroxine (SYNTHROID, LEVOTHROID) 175 MCG tablet, Take 1 tablet (175 mcg total) by mouth daily before breakfast., Disp: 30 tablet, Rfl: 6 .  lisinopril (PRINIVIL,ZESTRIL) 10 MG tablet, Take 10 mg by mouth daily. , Disp: , Rfl:  .  methimazole (TAPAZOLE) 5 MG tablet, Take 2.5 mg by mouth daily. , Disp: , Rfl:  .  simvastatin (ZOCOR) 40 MG tablet, TAKE 1 TABLET EVERY DAY, Disp: 90 tablet, Rfl: 3 .  tamsulosin (FLOMAX) 0.4 MG CAPS capsule, Take 0.4 mg by mouth daily after breakfast. , Disp: , Rfl:   Patient Care Team: [name]., MD as PCP - General (Family Medicine) [name],  MD as Consulting Physician (Cardiology)     Objective:   Vitals:   BP 114/72 (BP Location: Right Arm)   Pulse 67   Temp 98.8 F (37.1 C) (Oral)   Ht 5\' 6"  (1.676 m)   Wt 166 lb 12.8 oz (75.7 kg)   BMI 26.92 kg/m   BSA 1.88 m   Physical Exam  Constitutional: He is oriented to person, place, and time. He appears well-developed and well-nourished.  HENT:  Head: Normocephalic and atraumatic.  Right Ear: External ear normal.  Left Ear: External ear normal.  Nose: Nose normal.  Mouth/Throat: Oropharynx is clear and moist.  Eyes:  Pupils are equal, round, and reactive to light. Conjunctivae and EOM are normal.  Neck: Normal range of motion. Neck supple.  Cardiovascular: Normal rate, regular rhythm, normal heart sounds and intact distal pulses.  Pulmonary/Chest: Effort normal and breath sounds normal.  Abdominal: Soft. Bowel sounds are normal.  Genitourinary: Rectum normal, prostate normal and penis normal.  Musculoskeletal: Normal range of motion.  Neurological: He is alert and oriented to person, place, and time. No cranial nerve deficit. Coordination abnormal.  Pt has difficulty with walking. He has a right foot drop. He has decreased sensation to both feet.  Skin: Skin is warm and dry.  Left upper back with sebaceous cyst.  Psychiatric: He has a normal mood and affect. His behavior is normal. Judgment and thought content normal.  Nursing note and vitals reviewed.   Activities of Daily Living In your present state of health, do you have any difficulty performing the following activities: 02/12/2018 04/14/2017  Hearing? N N  Vision? N N  Difficulty concentrating or making decisions? N N  Walking or climbing stairs? Y Y  Comment Due to neuropathy in feet.  due to neuropathy  Dressing or bathing? N N  Doing errands, shopping? N N  Preparing Food and eating ? N N  Using the Toilet? N N  In the past six months, have you accidently leaked urine? N N  Do you have problems with  loss of bowel control? N N  Managing your Medications? N N  Managing your Finances? N N  Housekeeping or managing your Housekeeping? N N  Some recent data might be hidden    Fall Risk Assessment Fall Risk  02/12/2018 04/14/2017 04/12/2016 04/18/2015 12/14/2014  Falls in the past year? Yes No Yes No Yes  Number falls in past yr: 2 or more - 2 or more - 1  Injury with Fall? No - No - No  Risk Factor Category  - - High Fall Risk - -  Comment - - neuropathy - -  Risk for fall due to : Other (Comment) - - - -  Risk for fall due to: Comment neuropathy - - - -  Follow up Falls prevention discussed - - - -     Depression Screen PHQ 2/9 Scores 02/12/2018 04/14/2017 04/12/2016 04/18/2015  PHQ - 2 Score 1 0 0 0      Assessment & Plan:    Annual Physical Reviewed patient's Family Medical History Reviewed and updated list of patient's medical providers Assessment of cognitive impairment was done Assessed patient's functional ability Established a written schedule for health screening [place] Completed and Reviewed  Exercise Activities and Dietary recommendations Goals    . Increase water intake     Starting 04/12/16, I will increase my water intake to 6 glasses of water a day.    . Increase water intake     Recommend increasing water intake to 4-6 glasses a day.        Immunization History  Administered Date(s) Administered  . Influenza, High Dose Seasonal PF 03/31/2016, 02/12/2018  . Influenza,inj,Quad PF,6+ Mos 04/21/2015  . Pneumococcal Conjugate-13 03/07/2014  . Pneumococcal Polysaccharide-23 12/04/2011  . Tdap 02/23/2011    Health Maintenance  Topic Date Due  . OPHTHALMOLOGY EXAM  10/01/2016  . HEMOGLOBIN A1C  10/13/2017  . FOOT EXAM  12/10/2017  . TETANUS/TDAP  02/22/2021  . INFLUENZA VACCINE  Completed  . PNA vac Low Risk Adult  Completed     Discussed health benefits of  physical activity, and encouraged him to engage in regular  exercise appropriate for his age and condition.   1. BMI 26.0-26.9,adult   2. Type 2 diabetes mellitus with diabetic neuropathy, without long-term current use of insulin (HCC) Controlled. - Comprehensive metabolic panel - CBC w/Diff/Platelet - Lipid panel - TSH - Hemoglobin A1C - B12  3. Essential (primary) hypertension  - Comprehensive metabolic panel - CBC w/Diff/Platelet - Lipid panel - TSH - Hemoglobin A1C - B12  4. Other hyperlipidemia  - Comprehensive metabolic panel - CBC w/Diff/Platelet - Lipid panel - TSH - Hemoglobin A1C - B12  5. Hyperthyroidism  - Comprehensive metabolic panel - CBC w/Diff/Platelet - Lipid panel - TSH - Hemoglobin A1C - B12 - Ambulatory referral to Endocrinology  6. Neuropathy  - Comprehensive metabolic panel - CBC w/Diff/Platelet - Lipid panel - TSH - Hemoglobin A1C - B12 - Ambulatory referral to Neurology  7. Arteriosclerosis of coronary artery   8. CHB (complete heart block) ([place]) Pacemaker in place.  9. Foot drop, right 10.CAD All risk factors treated. 11.Fall risk Have neurology see pt again ,may just need PT referral.  ----------------------------------------------------------------------  I have done the exam and reviewed the chart and it is accurate to the best of my knowledge. Development worker, community has been used and  any errors in dictation or transcription are unintentional. Miguel Aschoff M.D. [place], MD  [facility]

## 2018-02-12 NOTE — Telephone Encounter (Signed)
error 

## 2018-02-15 DIAGNOSIS — E1140 Type 2 diabetes mellitus with diabetic neuropathy, unspecified: Secondary | ICD-10-CM | POA: Insufficient documentation

## 2018-02-15 DIAGNOSIS — M21371 Foot drop, right foot: Secondary | ICD-10-CM | POA: Insufficient documentation

## 2018-02-16 DIAGNOSIS — E7849 Other hyperlipidemia: Secondary | ICD-10-CM | POA: Diagnosis not present

## 2018-02-16 DIAGNOSIS — I10 Essential (primary) hypertension: Secondary | ICD-10-CM | POA: Diagnosis not present

## 2018-02-16 DIAGNOSIS — E0590 Thyrotoxicosis, unspecified without thyrotoxic crisis or storm: Secondary | ICD-10-CM | POA: Diagnosis not present

## 2018-02-16 DIAGNOSIS — G629 Polyneuropathy, unspecified: Secondary | ICD-10-CM | POA: Diagnosis not present

## 2018-02-16 DIAGNOSIS — E1140 Type 2 diabetes mellitus with diabetic neuropathy, unspecified: Secondary | ICD-10-CM | POA: Diagnosis not present

## 2018-02-17 LAB — CBC WITH DIFFERENTIAL/PLATELET
BASOS ABS: 0 10*3/uL (ref 0.0–0.2)
Basos: 1 %
EOS (ABSOLUTE): 0.1 10*3/uL (ref 0.0–0.4)
EOS: 2 %
HEMATOCRIT: 42.6 % (ref 37.5–51.0)
HEMOGLOBIN: 14.3 g/dL (ref 13.0–17.7)
IMMATURE GRANS (ABS): 0 10*3/uL (ref 0.0–0.1)
Immature Granulocytes: 0 %
LYMPHS ABS: 2.8 10*3/uL (ref 0.7–3.1)
Lymphs: 54 %
MCH: 31.2 pg (ref 26.6–33.0)
MCHC: 33.6 g/dL (ref 31.5–35.7)
MCV: 93 fL (ref 79–97)
MONOCYTES: 9 %
Monocytes Absolute: 0.5 10*3/uL (ref 0.1–0.9)
Neutrophils Absolute: 1.8 10*3/uL (ref 1.4–7.0)
Neutrophils: 34 %
Platelets: 190 10*3/uL (ref 150–450)
RBC: 4.58 x10E6/uL (ref 4.14–5.80)
RDW: 12.7 % (ref 12.3–15.4)
WBC: 5.1 10*3/uL (ref 3.4–10.8)

## 2018-02-17 LAB — COMPREHENSIVE METABOLIC PANEL
ALBUMIN: 3.9 g/dL (ref 3.5–4.7)
ALT: 31 IU/L (ref 0–44)
AST: 31 IU/L (ref 0–40)
Albumin/Globulin Ratio: 1.4 (ref 1.2–2.2)
Alkaline Phosphatase: 103 IU/L (ref 39–117)
BILIRUBIN TOTAL: 0.4 mg/dL (ref 0.0–1.2)
BUN / CREAT RATIO: 25 — AB (ref 10–24)
BUN: 22 mg/dL (ref 8–27)
CALCIUM: 9.3 mg/dL (ref 8.6–10.2)
CHLORIDE: 105 mmol/L (ref 96–106)
CO2: 21 mmol/L (ref 20–29)
Creatinine, Ser: 0.89 mg/dL (ref 0.76–1.27)
GFR calc Af Amer: 93 mL/min/{1.73_m2} (ref >59)
GFR calc non Af Amer: 81 mL/min/{1.73_m2} (ref >59)
GLUCOSE: 137 mg/dL — AB (ref 65–99)
Globulin, Total: 2.7 g/dL (ref 1.5–4.5)
Potassium: 4.4 mmol/L (ref 3.5–5.2)
Sodium: 140 mmol/L (ref 134–144)
TOTAL PROTEIN: 6.6 g/dL (ref 6.0–8.5)

## 2018-02-17 LAB — HEMOGLOBIN A1C
ESTIMATED AVERAGE GLUCOSE: 134 mg/dL
HEMOGLOBIN A1C: 6.3 % — AB (ref 4.8–5.6)

## 2018-02-17 LAB — LIPID PANEL
CHOL/HDL RATIO: 2.6 ratio (ref 0.0–5.0)
Cholesterol, Total: 157 mg/dL (ref 100–199)
HDL: 60 mg/dL (ref >39)
LDL CALC: 86 mg/dL (ref 0–99)
Triglycerides: 53 mg/dL (ref 0–149)
VLDL CHOLESTEROL CAL: 11 mg/dL (ref 5–40)

## 2018-02-17 LAB — TSH: TSH: 0.646 u[IU]/mL (ref 0.450–4.500)

## 2018-02-17 LAB — VITAMIN B12: Vitamin B-12: 566 pg/mL (ref 232–1245)

## 2018-02-18 ENCOUNTER — Telehealth: Payer: Self-pay

## 2018-02-18 NOTE — Telephone Encounter (Signed)
Patient advised as below.  

## 2018-02-18 NOTE — Telephone Encounter (Signed)
-----   Message from [name]., MD sent at 02/17/2018 10:00 AM EDT ----- Labs stable.

## 2018-02-20 ENCOUNTER — Other Ambulatory Visit: Payer: Self-pay | Admitting: Family Medicine

## 2018-02-20 NOTE — Telephone Encounter (Signed)
Please review. Thanks!  

## 2018-02-20 NOTE — Telephone Encounter (Signed)
[facility] faxed a refill request for the following medication. Thanks CC  tamsulosin (FLOMAX) 0.4 MG CAPS capsule

## 2018-02-23 MED ORDER — TAMSULOSIN HCL 0.4 MG PO CAPS: 0.4000 mg | ORAL_CAPSULE | Freq: Every day | ORAL | 3 refills | Status: DC

## 2018-02-25 DIAGNOSIS — E039 Hypothyroidism, unspecified: Secondary | ICD-10-CM | POA: Diagnosis not present

## 2018-03-18 DIAGNOSIS — H538 Other visual disturbances: Secondary | ICD-10-CM | POA: Diagnosis not present

## 2018-03-18 DIAGNOSIS — E1142 Type 2 diabetes mellitus with diabetic polyneuropathy: Secondary | ICD-10-CM | POA: Diagnosis not present

## 2018-03-18 DIAGNOSIS — M21371 Foot drop, right foot: Secondary | ICD-10-CM | POA: Diagnosis not present

## 2018-03-18 DIAGNOSIS — R2681 Unsteadiness on feet: Secondary | ICD-10-CM | POA: Diagnosis not present

## 2018-05-18 ENCOUNTER — Other Ambulatory Visit: Payer: Self-pay | Admitting: Family Medicine

## 2018-05-21 DIAGNOSIS — R011 Cardiac murmur, unspecified: Secondary | ICD-10-CM | POA: Diagnosis not present

## 2018-05-21 DIAGNOSIS — I10 Essential (primary) hypertension: Secondary | ICD-10-CM | POA: Diagnosis not present

## 2018-05-21 DIAGNOSIS — I495 Sick sinus syndrome: Secondary | ICD-10-CM | POA: Diagnosis not present

## 2018-05-21 DIAGNOSIS — I442 Atrioventricular block, complete: Secondary | ICD-10-CM | POA: Diagnosis not present

## 2018-05-21 DIAGNOSIS — E7849 Other hyperlipidemia: Secondary | ICD-10-CM | POA: Diagnosis not present

## 2018-05-21 DIAGNOSIS — Z950 Presence of cardiac pacemaker: Secondary | ICD-10-CM | POA: Diagnosis not present

## 2018-05-21 DIAGNOSIS — E079 Disorder of thyroid, unspecified: Secondary | ICD-10-CM | POA: Diagnosis not present

## 2018-05-21 DIAGNOSIS — N400 Enlarged prostate without lower urinary tract symptoms: Secondary | ICD-10-CM | POA: Diagnosis not present

## 2018-09-19 ENCOUNTER — Other Ambulatory Visit: Payer: Self-pay | Admitting: Family Medicine

## 2018-12-22 DIAGNOSIS — I442 Atrioventricular block, complete: Secondary | ICD-10-CM | POA: Diagnosis not present

## 2018-12-26 ENCOUNTER — Other Ambulatory Visit: Payer: Self-pay | Admitting: Family Medicine

## 2019-02-11 NOTE — Progress Notes (Signed)
Subjective:   Todd Ortega is a 84 y.o. male who presents for Medicare Annual/Subsequent preventive examination.    This visit is being conducted through telemedicine due to the COVID-19 pandemic. This patient has given me verbal consent via doximity to conduct this visit, patient states they are participating from their home address. Some vital signs may be absent or patient reported.    Patient identification: identified by name, DOB, and current address  Review of Systems:  N/A  Cardiac Risk Factors include: advanced age (>59men, >33 women);diabetes mellitus;dyslipidemia;hypertension;male gender     Objective:    Vitals: There were no vitals taken for this visit.  There is no height or weight on file to calculate BMI. Unable to obtain vitals due to visit being conducted via telephonically.   Advanced Directives 02/15/2019 02/12/2018 04/14/2017 04/12/2016 06/06/2015 04/20/2015 04/19/2015  Does Patient Have a Medical Advance Directive? No Yes Yes Yes No No No  Type of Advance Directive - [place];Living will Living will Living will - - -  Does patient want to make changes to medical advance directive? No - Patient declined - - - - - -  Copy of Healthcare Power of Attorney in Chart? - No - copy requested - No - copy requested - - -  Would patient like information on creating a medical advance directive? - - - - No - patient declined information No - patient declined information No - patient declined information    Tobacco Social History   Tobacco Use  Smoking Status Never Smoker  Smokeless Tobacco Never Used     Counseling given: Not Answered   Clinical Intake:  Pre-visit preparation completed: No  Pain : No/denies pain Pain Score: 0-No pain     Nutritional Risks: None Diabetes: Yes  How often do you need to have someone help you when you read instructions, pamphlets, or other written materials from your doctor or pharmacy?: 1 - Never   Diabetes:   Is the patient diabetic?  Yes  borderline If diabetic, was a CBG obtained today?  No  Did the patient bring in their glucometer from home?  No  How often do you monitor your CBG's? Occasionally, not daily.   Financial Strains and Diabetes Management:  Are you having any financial strains with the device, your supplies or your medication? No .  Does the patient want to be seen by Chronic Care Management for management of their diabetes?  No  Would the patient like to be referred to a Nutritionist or for Diabetic Management?  No   Diabetic Exams:  Diabetic Eye Exam: Completed 10/02/15. Overdue for diabetic eye exam. Pt has been advised about the importance in completing this exam.   Diabetic Foot Exam: Completed 12/10/16. Pt has been advised about the importance in completing this exam. Note made to follow up on this at next in office visit.    Interpreter Needed?: No  Information entered by :: [facility], LPN  Past Medical History:  Diagnosis Date  . Arthritis   . BPH (benign prostatic hypertrophy)   . Chronic kidney disease   . Complete heart block ([place])   . Coronary artery disease   . Diabetes mellitus without complication ([place])   . Foot drop, right foot   . Hyperlipidemia   . Hypertension   . Hyperthyroidism   . Nephrolithiasis   . Neuropathy   . Shortness of breath dyspnea   . Sleep apnea    Past Surgical History:  Procedure Laterality  Date  . CARDIAC CATHETERIZATION    . HERNIA REPAIR Bilateral    Inguinal Hernia Repair  . MASS EXCISION     removed from left hand  . PACEMAKER INSERTION N/A 04/20/2015   Procedure: INSERTION PACEMAKER;  Surgeon: Isaias Cowman, MD;  Location: [facility] ORS;  Service: Cardiovascular;  Laterality: N/A;   Family History  Problem Relation Age of Onset  . Heart attack Mother   . Gout Mother   . Diabetes Mother   . Hypertension Mother   . Hyperlipidemia Mother   . Lung cancer Father   . Drug abuse Brother    Social History    Socioeconomic History  . Marital status: Married    Spouse name: Not on file  . Number of children: 2  . Years of education: Not on file  . Highest education level: Some college, no degree  Occupational History  . Occupation: retired  Scientific laboratory technician  . Financial resource strain: Not very hard  . Food insecurity    Worry: Never true    Inability: Never true  . Transportation needs    Medical: No    Non-medical: No  Tobacco Use  . Smoking status: Never Smoker  . Smokeless tobacco: Never Used  Substance and Sexual Activity  . Alcohol use: No    Alcohol/week: 0.0 standard drinks  . Drug use: No  . Sexual activity: Not on file  Lifestyle  . Physical activity    Days per week: 0 days    Minutes per session: 0 min  . Stress: Not at all  Relationships  . Social Herbalist on phone: Patient refused    Gets together: Patient refused    Attends religious service: Patient refused    Active member of club or organization: Patient refused    Attends meetings of clubs or organizations: Patient refused    Relationship status: Patient refused  Other Topics Concern  . Not on file  Social History Narrative  . Not on file    Outpatient Encounter Medications as of 02/15/2019  Medication Sig  . acetaminophen (TYLENOL) 325 MG tablet Take 650 mg by mouth every 6 (six) hours as needed.  Marland Kitchen aspirin 81 MG tablet Take 81 mg by mouth daily.   . Calcium Carb-Cholecalciferol (CALCIUM 500+D3 PO) Take 1 tablet by mouth daily.  . Cholecalciferol (VITAMIN D3) 2000 UNITS capsule Take 2,000 Units by mouth daily.   . finasteride (PROSCAR) 5 MG tablet TAKE 1 TABLET AT BEDTIME  . gabapentin (NEURONTIN) 300 MG capsule Take 1 capsule (300 mg total) by mouth daily.  Marland Kitchen glucose blood test strip BAYER CONTOUR TEST (In Vitro Strip)  1 (one) Strip Strip check sugars once daily, or as needed for 0 days  Quantity: 300;  Refills: 4   Ordered :31-Mar-2013  [name] ;  Started 31-Mar-2013 Active Comments:  Medication taken as needed.  Marland Kitchen levothyroxine (SYNTHROID, LEVOTHROID) 175 MCG tablet Take 1 tablet (175 mcg total) by mouth daily before breakfast.  . lisinopril (PRINIVIL,ZESTRIL) 10 MG tablet Take 10 mg by mouth daily.   . Multiple Vitamin (MULTIVITAMIN) capsule Take 1 capsule by mouth daily.  . simvastatin (ZOCOR) 40 MG tablet TAKE 1 TABLET EVERY DAY  . tamsulosin (FLOMAX) 0.4 MG CAPS capsule TAKE 1 CAPSULE (0.4 MG TOTAL) BY MOUTH DAILY AFTER BREAKFAST.  . cyanocobalamin 100 MCG tablet Take 100 mcg by mouth daily.  . methimazole (TAPAZOLE) 5 MG tablet Take 2.5 mg by mouth daily.    No  facility-administered encounter medications on file as of 02/15/2019.     Activities of Daily Living In your present state of health, do you have any difficulty performing the following activities: 02/15/2019  Hearing? N  Vision? N  Comment Wears eye glasses daily.  Difficulty concentrating or making decisions? N  Walking or climbing stairs? N  Dressing or bathing? N  Doing errands, shopping? N  Preparing Food and eating ? N  Using the Toilet? N  In the past six months, have you accidently leaked urine? N  Do you have problems with loss of bowel control? N  Managing your Medications? N  Managing your Finances? N  Housekeeping or managing your Housekeeping? N  Some recent data might be hidden    Patient Care Team: [name]., MD as PCP - General (Family Medicine) [name], MD as Consulting Physician (Cardiology)   Assessment:   This is a routine wellness examination for Todd Ortega.  Exercise Activities and Dietary recommendations Current Exercise Habits: The patient does not participate in regular exercise at present, Exercise limited by: None identified  Goals    . Increase water intake     Recommend increasing water intake to 4-6 glasses a day.     Marland Kitchen LIFESTYLE - DECREASE FALLS RISK     Recommend to remove any items from the home that may cause slips or trips.       Fall  Risk Fall Risk  02/15/2019 02/12/2018 04/14/2017 04/12/2016 04/18/2015  Falls in the past year? 1 Yes No Yes No  Number falls in past yr: 0 2 or more - 2 or more -  Injury with Fall? 0 No - No -  Risk Factor Category  - - - High Fall Risk -  Comment - - - neuropathy -  Risk for fall due to : - Other (Comment) - - -  Risk for fall due to: Comment - neuropathy - - -  Follow up Falls prevention discussed Falls prevention discussed - - -   FALL RISK PREVENTION PERTAINING TO THE HOME:  Any stairs in or around the home? Yes  If so, are there any without handrails? No   Home free of loose throw rugs in walkways, pet beds, electrical cords, etc? Yes  Adequate lighting in your home to reduce risk of falls? Yes   ASSISTIVE DEVICES UTILIZED TO PREVENT FALLS:  Life alert? No  Use of a cane, walker or w/c? Yes  Grab bars in the bathroom? Yes  Shower chair or bench in shower? Yes  Elevated toilet seat or a handicapped toilet? Yes    TIMED UP AND GO:  Was the test performed? No .    Depression Screen PHQ 2/9 Scores 02/15/2019 02/12/2018 04/14/2017 04/12/2016  PHQ - 2 Score 0 1 0 0    Cognitive Function: Declined today.      6CIT Screen 04/12/2016  What Year? 0 points  What month? 0 points  What time? 0 points  Count back from 20 0 points  Months in reverse 0 points  Repeat phrase 2 points  Total Score 2    Immunization History  Administered Date(s) Administered  . Influenza, High Dose Seasonal PF 03/31/2016, 02/12/2018  . Influenza,inj,Quad PF,6+ Mos 04/21/2015  . Pneumococcal Conjugate-13 03/07/2014  . Pneumococcal Polysaccharide-23 12/04/2011  . Tdap 02/23/2011    Qualifies for Shingles Vaccine? Yes . Due for Shingrix. Education has been provided regarding the importance of this vaccine. Pt has been advised to call insurance  company to determine out of pocket expense. Advised may also receive vaccine at local pharmacy or Health Dept. Verbalized acceptance and understanding.   Tdap: Although this vaccine is not a covered service during a Wellness Exam, does the patient still wish to receive this vaccine today?  No .   Flu Vaccine: Due for Flu vaccine. Does the patient want to receive this vaccine today?  No .   Pneumococcal Vaccine: Up to date  Screening Tests Health Maintenance  Topic Date Due  . OPHTHALMOLOGY EXAM  10/01/2016  . FOOT EXAM  12/10/2017  . HEMOGLOBIN A1C  08/17/2018  . INFLUENZA VACCINE  01/02/2019  . TETANUS/TDAP  02/22/2021  . PNA vac Low Risk Adult  Completed   Cancer Screenings:  Colorectal Screening: No longer required.   Lung Cancer Screening: (Low Dose CT Chest recommended if Age 57-80 years, 30 pack-year currently smoking OR have quit w/in 15years.) does not qualify.   Additional Screening:  Dental Screening: Recommended annual dental exams for proper oral hygiene  Community Resource Referral:  CRR required this visit?  No        Plan:  I have personally reviewed and addressed the Medicare Annual Wellness questionnaire and have noted the following in the patient's chart:  A. Medical and social history B. Use of alcohol, tobacco or illicit drugs  C. Current medications and supplements D. Functional ability and status E.  Nutritional status F.  Physical activity G. Advance directives H. List of other physicians I.  Hospitalizations, surgeries, and ER visits in previous 12 months J.  [place] such as hearing and vision if needed, cognitive and depression L. Referrals and appointments   In addition, I have reviewed and discussed with patient certain preventive protocols, quality metrics, and best practice recommendations. A written personalized care plan for preventive services as well as general preventive health recommendations were provided to patient.   Glendora Score, [place]  [id number] Nurse Health Advisor  Nurse Notes: Pt needs a influenza vaccine, diabetic foot exam and Hgb A1c  checked at tomorrows physical. Pt advised to set up an eye exam.

## 2019-02-15 ENCOUNTER — Telehealth: Payer: Self-pay

## 2019-02-15 ENCOUNTER — Other Ambulatory Visit: Payer: Self-pay

## 2019-02-15 ENCOUNTER — Ambulatory Visit (INDEPENDENT_AMBULATORY_CARE_PROVIDER_SITE_OTHER): Payer: Medicare HMO

## 2019-02-15 DIAGNOSIS — Z0000 Encounter for general adult medical examination without abnormal findings: Secondary | ICD-10-CM | POA: Diagnosis not present

## 2019-02-15 NOTE — Patient Instructions (Addendum)
[name] , Thank you for taking time to come for your Medicare Wellness Visit. I appreciate your ongoing commitment to your health goals. Please review the following plan we discussed and let me know if I can assist you in the future.   Screening recommendations/referrals: Colonoscopy: No longer required.  Recommended yearly ophthalmology/optometry visit for glaucoma screening and checkup Recommended yearly dental visit for hygiene and checkup  Vaccinations: Influenza vaccine: Currently due Pneumococcal vaccine: Completed series Tdap vaccine: Up to date, due 02/2021 Shingles vaccine: Pt declines today.     Advanced directives: Advance directive discussed with you today. Even though you declined this today please call our office should you change your mind and we can give you the proper paperwork for you to fill out.  Conditions/risks identified: Fall risk prevention discussed today. Recommend to continue to increase water intake to 6-8 8 oz glasses a day.   Next appointment: 02/16/19 @ 2:00 PM with Dr Rosanna Randy. Declined scheduling AWV for 2021 at this time.   Preventive Care 65 Years and Older, Male Preventive care refers to lifestyle choices and visits with your health care provider that can promote health and wellness. What does preventive care include?  A yearly physical exam. This is also called an annual well check.  Dental exams once or twice a year.  Routine eye exams. Ask your health care provider how often you should have your eyes checked.  Personal lifestyle choices, including:  Daily care of your teeth and gums.  Regular physical activity.  Eating a healthy diet.  Avoiding tobacco and drug use.  Limiting alcohol use.  Practicing safe sex.  Taking low doses of aspirin every day.  Taking vitamin and mineral supplements as recommended by your health care provider. What happens during an annual well check? The services and screenings done by your health care  provider during your annual well check will depend on your age, overall health, lifestyle risk factors, and family history of disease. Counseling  Your health care provider may ask you questions about your:  Alcohol use.  Tobacco use.  Drug use.  Emotional well-being.  Home and relationship well-being.  Sexual activity.  Eating habits.  History of falls.  Memory and ability to understand (cognition).  Work and work Statistician. Screening  You may have the following tests or measurements:  Height, weight, and BMI.  Blood pressure.  Lipid and cholesterol levels. These may be checked every 5 years, or more frequently if you are over 48 years old.  Skin check.  Lung cancer screening. You may have this screening every year starting at age 38 if you have a 30-pack-year history of smoking and currently smoke or have quit within the past 15 years.  Fecal occult blood test (FOBT) of the stool. You may have this test every year starting at age 26.  Flexible sigmoidoscopy or colonoscopy. You may have a sigmoidoscopy every 5 years or a colonoscopy every 10 years starting at age 26.  Prostate cancer screening. Recommendations will vary depending on your family history and other risks.  Hepatitis C blood test.  Hepatitis B blood test.  Sexually transmitted disease (STD) testing.  Diabetes screening. This is done by checking your blood sugar (glucose) after you have not eaten for a while (fasting). You may have this done every 1-3 years.  Abdominal aortic aneurysm (AAA) screening. You may need this if you are a current or former smoker.  Osteoporosis. You may be screened starting at age 42 if you are  at high risk. Talk with your health care provider about your test results, treatment options, and if necessary, the need for more tests. Vaccines  Your health care provider may recommend certain vaccines, such as:  Influenza vaccine. This is recommended every year.  Tetanus,  diphtheria, and acellular pertussis (Tdap, Td) vaccine. You may need a Td booster every 10 years.  Zoster vaccine. You may need this after age 49.  Pneumococcal 13-valent conjugate (PCV13) vaccine. One dose is recommended after age 62.  Pneumococcal polysaccharide (PPSV23) vaccine. One dose is recommended after age 54. Talk to your health care provider about which screenings and vaccines you need and how often you need them. This information is not intended to replace advice given to you by your health care provider. Make sure you discuss any questions you have with your health care provider. Document Released: 06/16/2015 Document Revised: 02/07/2016 Document Reviewed: 03/21/2015 Elsevier Interactive Patient Education  2017 [employer] Prevention in the Home Falls can cause injuries. They can happen to people of all ages. There are many things you can do to make your home safe and to help prevent falls. What can I do on the outside of my home?  Regularly fix the edges of walkways and driveways and fix any cracks.  Remove anything that might make you trip as you walk through a door, such as a raised step or threshold.  Trim any bushes or trees on the path to your home.  Use bright outdoor lighting.  Clear any walking paths of anything that might make someone trip, such as rocks or tools.  Regularly check to see if handrails are loose or broken. Make sure that both sides of any steps have handrails.  Any raised decks and porches should have guardrails on the edges.  Have any leaves, snow, or ice cleared regularly.  Use sand or salt on walking paths during winter.  Clean up any spills in your garage right away. This includes oil or grease spills. What can I do in the bathroom?  Use night lights.  Install grab bars by the toilet and in the tub and shower. Do not use towel bars as grab bars.  Use non-skid mats or decals in the tub or shower.  If you need to sit down in  the shower, use a plastic, non-slip stool.  Keep the floor dry. Clean up any water that spills on the floor as soon as it happens.  Remove soap buildup in the tub or shower regularly.  Attach bath mats securely with double-sided non-slip rug tape.  Do not have throw rugs and other things on the floor that can make you trip. What can I do in the bedroom?  Use night lights.  Make sure that you have a light by your bed that is easy to reach.  Do not use any sheets or blankets that are too big for your bed. They should not hang down onto the floor.  Have a firm chair that has side arms. You can use this for support while you get dressed.  Do not have throw rugs and other things on the floor that can make you trip. What can I do in the kitchen?  Clean up any spills right away.  Avoid walking on wet floors.  Keep items that you use a lot in easy-to-reach places.  If you need to reach something above you, use a strong step stool that has a grab bar.  Keep electrical cords out of  the way.  Do not use floor polish or wax that makes floors slippery. If you must use wax, use non-skid floor wax.  Do not have throw rugs and other things on the floor that can make you trip. What can I do with my stairs?  Do not leave any items on the stairs.  Make sure that there are handrails on both sides of the stairs and use them. Fix handrails that are broken or loose. Make sure that handrails are as long as the stairways.  Check any carpeting to make sure that it is firmly attached to the stairs. Fix any carpet that is loose or worn.  Avoid having throw rugs at the top or bottom of the stairs. If you do have throw rugs, attach them to the floor with carpet tape.  Make sure that you have a light switch at the top of the stairs and the bottom of the stairs. If you do not have them, ask someone to add them for you. What else can I do to help prevent falls?  Wear shoes that:  Do not have high  heels.  Have rubber bottoms.  Are comfortable and fit you well.  Are closed at the toe. Do not wear sandals.  If you use a stepladder:  Make sure that it is fully opened. Do not climb a closed stepladder.  Make sure that both sides of the stepladder are locked into place.  Ask someone to hold it for you, if possible.  Clearly mark and make sure that you can see:  Any grab bars or handrails.  First and last steps.  Where the edge of each step is.  Use tools that help you move around (mobility aids) if they are needed. These include:  Canes.  Walkers.  Scooters.  Crutches.  Turn on the lights when you go into a dark area. Replace any light bulbs as soon as they burn out.  Set up your furniture so you have a clear path. Avoid moving your furniture around.  If any of your floors are uneven, fix them.  If there are any pets around you, be aware of where they are.  Review your medicines with your doctor. Some medicines can make you feel dizzy. This can increase your chance of falling. Ask your doctor what other things that you can do to help prevent falls. This information is not intended to replace advice given to you by your health care provider. Make sure you discuss any questions you have with your health care provider. Document Released: 03/16/2009 Document Revised: 10/26/2015 Document Reviewed: 06/24/2014 Elsevier Interactive Patient Education  2017 [employer].

## 2019-02-15 NOTE — Telephone Encounter (Signed)
[facility] Covid Screening

## 2019-02-16 ENCOUNTER — Other Ambulatory Visit: Payer: Self-pay

## 2019-02-16 ENCOUNTER — Ambulatory Visit (INDEPENDENT_AMBULATORY_CARE_PROVIDER_SITE_OTHER): Payer: Medicare HMO | Admitting: Family Medicine

## 2019-02-16 ENCOUNTER — Encounter: Payer: Self-pay | Admitting: Family Medicine

## 2019-02-16 ENCOUNTER — Ambulatory Visit: Payer: Medicare HMO

## 2019-02-16 VITALS — BP 112/72 | HR 70 | Temp 98.2°F | Resp 16 | Ht 66.0 in | Wt 167.0 lb

## 2019-02-16 DIAGNOSIS — Z0000 Encounter for general adult medical examination without abnormal findings: Secondary | ICD-10-CM | POA: Diagnosis not present

## 2019-02-16 DIAGNOSIS — E0590 Thyrotoxicosis, unspecified without thyrotoxic crisis or storm: Secondary | ICD-10-CM | POA: Diagnosis not present

## 2019-02-16 DIAGNOSIS — I10 Essential (primary) hypertension: Secondary | ICD-10-CM | POA: Diagnosis not present

## 2019-02-16 DIAGNOSIS — E7849 Other hyperlipidemia: Secondary | ICD-10-CM

## 2019-02-16 DIAGNOSIS — Z23 Encounter for immunization: Secondary | ICD-10-CM | POA: Diagnosis not present

## 2019-02-16 DIAGNOSIS — G629 Polyneuropathy, unspecified: Secondary | ICD-10-CM

## 2019-02-16 DIAGNOSIS — E1140 Type 2 diabetes mellitus with diabetic neuropathy, unspecified: Secondary | ICD-10-CM

## 2019-02-16 DIAGNOSIS — M545 Low back pain: Secondary | ICD-10-CM | POA: Diagnosis not present

## 2019-02-16 DIAGNOSIS — G8929 Other chronic pain: Secondary | ICD-10-CM

## 2019-02-16 NOTE — Progress Notes (Addendum)
Patient: Todd Ortega, Male    DOB: September 01, 1934, 84 y.o.   MRN: [id number] Visit Date: 02/16/2019  Today's Provider: [name], MD   Chief Complaint  Patient presents with  . Annual Exam   Subjective:   Patient had AWV on 02/15/2019.    Annual physical visit Todd Ortega is a 84 y.o. male. He feels well. He reports exercising occasionally. He reports he is sleeping well.  Colonoscopy- 03/31/2009. Normal.  Immunization History  Administered Date(s) Administered  . Influenza, High Dose Seasonal PF 03/31/2016, 02/12/2018  . Influenza,inj,Quad PF,6+ Mos 04/21/2015  . Pneumococcal Conjugate-13 03/07/2014  . Pneumococcal Polysaccharide-23 12/04/2011  . Tdap 02/23/2011     Review of Systems  Constitutional: Negative.   HENT: Negative.   Eyes: Negative.   Respiratory: Negative.   Cardiovascular: Negative.   Gastrointestinal: Negative.   Endocrine: Negative.   Genitourinary: Positive for difficulty urinating.  Allergic/Immunologic: Negative.   Neurological: Positive for weakness and numbness.  Psychiatric/Behavioral: Negative.   All other systems reviewed and are negative.   Social History   Socioeconomic History  . Marital status: Married    Spouse name: Not on file  . Number of children: 2  . Years of education: Not on file  . Highest education level: Some college, no degree  Occupational History  . Occupation: retired  Scientific laboratory technician  . Financial resource strain: Not very hard  . Food insecurity    Worry: Never true    Inability: Never true  . Transportation needs    Medical: No    Non-medical: No  Tobacco Use  . Smoking status: Never Smoker  . Smokeless tobacco: Never Used  Substance and Sexual Activity  . Alcohol use: No    Alcohol/week: 0.0 standard drinks  . Drug use: No  . Sexual activity: Not on file  Lifestyle  . Physical activity    Days per week: 0 days    Minutes per session: 0 min  . Stress: Not at all  Relationships  . Social  Herbalist on phone: Patient refused    Gets together: Patient refused    Attends religious service: Patient refused    Active member of club or organization: Patient refused    Attends meetings of clubs or organizations: Patient refused    Relationship status: Patient refused  . Intimate partner violence    Fear of current or ex partner: Patient refused    Emotionally abused: Patient refused    Physically abused: Patient refused    Forced sexual activity: Patient refused  Other Topics Concern  . Not on file  Social History Narrative  . Not on file    Past Medical History:  Diagnosis Date  . Arthritis   . BPH (benign prostatic hypertrophy)   . Chronic kidney disease   . Complete heart block ([place])   . Coronary artery disease   . Diabetes mellitus without complication ([place])   . Foot drop, right foot   . Hyperlipidemia   . Hypertension   . Hyperthyroidism   . Nephrolithiasis   . Neuropathy   . Shortness of breath dyspnea   . Sleep apnea      Patient Active Problem List   Diagnosis Date Noted  . Type 2 diabetes mellitus with diabetic neuropathy, unspecified ([place]) 02/15/2018  . Foot drop, right 02/15/2018  . CHB (complete heart block) ([place]) 04/20/2015  . Benign fibroma of prostate 10/17/2014  . Arteriosclerosis of coronary artery 10/17/2014  .  Essential (primary) hypertension 10/17/2014  . Enthesopathy 10/17/2014  . HLD (hyperlipidemia) 10/17/2014  . Hyperthyroidism 10/17/2014  . 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 Neuropathy 10/17/2014  . Apnea, sleep 10/17/2014  . Diabetes mellitus, type 2 ([place]) 10/17/2014  . Benign neoplasm of colon 10/19/2013  . BP (high blood pressure) 10/19/2013  . Benign prostatic hyperplasia with urinary obstruction 04/06/2013  . Calculus of kidney 04/06/2013  . Abnormal prostate specific antigen 04/06/2013  . Hematuria, microscopic 04/06/2013  . Incomplete bladder emptying 04/06/2013    Past Surgical  History:  Procedure Laterality Date  . CARDIAC CATHETERIZATION    . HERNIA REPAIR Bilateral    Inguinal Hernia Repair  . MASS EXCISION     removed from left hand  . PACEMAKER INSERTION N/A 04/20/2015   Procedure: INSERTION PACEMAKER;  Surgeon: Isaias Cowman, MD;  Location: [facility] ORS;  Service: Cardiovascular;  Laterality: N/A;    His family history includes Diabetes in his mother; Drug abuse in his brother; Gout in his mother; Heart attack in his mother; Hyperlipidemia in his mother; Hypertension in his mother; Lung cancer in his father.   Current Outpatient Medications:  .  acetaminophen (TYLENOL) 325 MG tablet, Take 650 mg by mouth every 6 (six) hours as needed., Disp: , Rfl:  .  aspirin 81 MG tablet, Take 81 mg by mouth daily. , Disp: , Rfl:  .  Calcium Carb-Cholecalciferol (CALCIUM 500+D3 PO), Take 1 tablet by mouth daily., Disp: , Rfl:  .  Cholecalciferol (VITAMIN D3) 2000 UNITS capsule, Take 2,000 Units by mouth daily. , Disp: , Rfl:  .  cyanocobalamin 100 MCG tablet, Take 100 mcg by mouth daily., Disp: , Rfl:  .  finasteride (PROSCAR) 5 MG tablet, TAKE 1 TABLET AT BEDTIME, Disp: 90 tablet, Rfl: 3 .  gabapentin (NEURONTIN) 300 MG capsule, Take 1 capsule (300 mg total) by mouth daily., Disp: 90 capsule, Rfl: 3 .  glucose blood test strip, BAYER CONTOUR TEST (In Vitro Strip)  1 (one) Strip Strip check sugars once daily, or as needed for 0 days  Quantity: 300;  Refills: 4   Ordered :31-Mar-2013  [name] ;  Started 31-Mar-2013 Active Comments: Medication taken as needed., Disp: , Rfl:  .  levothyroxine (SYNTHROID, LEVOTHROID) 175 MCG tablet, Take 1 tablet (175 mcg total) by mouth daily before breakfast., Disp: 30 tablet, Rfl: 6 .  lisinopril (PRINIVIL,ZESTRIL) 10 MG tablet, Take 10 mg by mouth daily. , Disp: , Rfl:  .  methimazole (TAPAZOLE) 5 MG tablet, Take 2.5 mg by mouth daily. , Disp: , Rfl:  .  Multiple Vitamin (MULTIVITAMIN) capsule, Take 1 capsule by mouth daily.,  Disp: , Rfl:  .  simvastatin (ZOCOR) 40 MG tablet, TAKE 1 TABLET EVERY DAY, Disp: 90 tablet, Rfl: 3 .  tamsulosin (FLOMAX) 0.4 MG CAPS capsule, TAKE 1 CAPSULE (0.4 MG TOTAL) BY MOUTH DAILY AFTER BREAKFAST., Disp: 90 capsule, Rfl: 3  Patient Care Team: [name]., MD as PCP - General (Family Medicine) [name], MD as Consulting Physician (Cardiology)    Objective:    Vitals: BP 112/72   Pulse 70   Temp 98.2 F (36.8 C)   Resp 16   Ht 5\' 6"  (1.676 m)   Wt 167 lb (75.8 kg)   SpO2 97%   BMI 26.95 kg/m   Physical Exam Vitals signs and nursing note reviewed.  Constitutional:      Appearance: Normal appearance. He is well-developed.  HENT:  Head: Normocephalic and atraumatic.     Right Ear: External ear normal.     Left Ear: External ear normal.     Nose: Nose normal.  Eyes:     Conjunctiva/sclera: Conjunctivae normal.     Pupils: Pupils are equal, round, and reactive to light.  Neck:     Musculoskeletal: Normal range of motion and neck supple.  Cardiovascular:     Rate and Rhythm: Normal rate and regular rhythm.     Heart sounds: Normal heart sounds.  Pulmonary:     Effort: Pulmonary effort is normal.     Breath sounds: Normal breath sounds.  Abdominal:     General: Bowel sounds are normal.     Palpations: Abdomen is soft.  Genitourinary:    Penis: Normal.      Prostate: Normal.     Rectum: Normal.  Musculoskeletal: Normal range of motion.  Skin:    General: Skin is warm and dry.     Comments: Left upper back with sebaceous cyst.  Neurological:     Mental Status: He is alert and oriented to person, place, and time.     Cranial Nerves: No cranial nerve deficit.     Coordination: Coordination abnormal.     Comments: Pt has difficulty with walking. He has a right foot drop. He has decreased sensation to both feet.  Psychiatric:        Mood and Affect: Mood normal.        Behavior: Behavior normal.        Thought Content: Thought content  normal.        Judgment: Judgment normal.   Pt unable to fully extend lower back due to OA/DDD.  Activities of Daily Living In your present state of health, do you have any difficulty performing the following activities: 02/15/2019  Hearing? N  Vision? N  Comment Wears eye glasses daily.  Difficulty concentrating or making decisions? N  Walking or climbing stairs? N  Dressing or bathing? N  Doing errands, shopping? N  Preparing Food and eating ? N  Using the Toilet? N  In the past six months, have you accidently leaked urine? N  Do you have problems with loss of bowel control? N  Managing your Medications? N  Managing your Finances? N  Housekeeping or managing your Housekeeping? N  Some recent data might be hidden    Fall Risk Assessment Fall Risk  02/15/2019 02/12/2018 04/14/2017 04/12/2016 04/18/2015  Falls in the past year? 1 Yes No Yes No  Number falls in past yr: 0 2 or more - 2 or more -  Injury with Fall? 0 No - No -  Risk Factor Category  - - - High Fall Risk -  Comment - - - neuropathy -  Risk for fall due to : - Other (Comment) - - -  Risk for fall due to: Comment - neuropathy - - -  Follow up Falls prevention discussed Falls prevention discussed - - -     Depression Screen PHQ 2/9 Scores 02/15/2019 02/12/2018 04/14/2017 04/12/2016  PHQ - 2 Score 0 1 0 0    6CIT Screen 04/12/2016  What Year? 0 points  What month? 0 points  What time? 0 points  Count back from 20 0 points  Months in reverse 0 points  Repeat phrase 2 points  Total Score 2      Assessment & Plan:     Annual Wellness Visit  Reviewed patient's Family Medical History Reviewed and  updated list of patient's medical providers Assessment of cognitive impairment was done Assessed patient's functional ability Established a written schedule for health screening [place] Completed and Reviewed  Exercise Activities and Dietary recommendations Goals    . Increase water  intake     Recommend increasing water intake to 4-6 glasses a day.     Marland Kitchen LIFESTYLE - DECREASE FALLS RISK     Recommend to remove any items from the home that may cause slips or trips.       Immunization History  Administered Date(s) Administered  . Influenza, High Dose Seasonal PF 03/31/2016, 02/12/2018  . Influenza,inj,Quad PF,6+ Mos 04/21/2015  . Pneumococcal Conjugate-13 03/07/2014  . Pneumococcal Polysaccharide-23 12/04/2011  . Tdap 02/23/2011    Health Maintenance  Topic Date Due  . OPHTHALMOLOGY EXAM  10/01/2016  . FOOT EXAM  12/10/2017  . HEMOGLOBIN A1C  08/17/2018  . INFLUENZA VACCINE  01/02/2019  . TETANUS/TDAP  02/22/2021  . PNA vac Low Risk Adult  Completed     Discussed health benefits of physical activity, and encouraged him to engage in regular exercise appropriate for his age and condition.   1. Annual physical exam Overall health fair at age 4.  2. Need for immunization against influenza  - Flu Vaccine QUAD High Dose(Fluad)  3. Type 2 diabetes mellitus with diabetic neuropathy, without long-term current use of insulin (HCC) Check microalbumin. - Hemoglobin A1c  4. Essential (primary) hypertension Controlled.Lisinoril. - Comprehensive metabolic panel  5. Other hyperlipidemia Zocor. - Lipid panel  6. Hyperthyroidism  - TSH  7. Neuropathy  - CBC with Differential/Platelet   [name], MD  [facility]

## 2019-02-17 LAB — CBC WITH DIFFERENTIAL/PLATELET
Basophils Absolute: 0 10*3/uL (ref 0.0–0.2)
Basos: 1 %
EOS (ABSOLUTE): 0.1 10*3/uL (ref 0.0–0.4)
Eos: 1 %
Hematocrit: 41.4 % (ref 37.5–51.0)
Hemoglobin: 14.4 g/dL (ref 13.0–17.7)
Immature Grans (Abs): 0 10*3/uL (ref 0.0–0.1)
Immature Granulocytes: 0 %
Lymphocytes Absolute: 2.5 10*3/uL (ref 0.7–3.1)
Lymphs: 44 %
MCH: 32.1 pg (ref 26.6–33.0)
MCHC: 34.8 g/dL (ref 31.5–35.7)
MCV: 92 fL (ref 79–97)
Monocytes Absolute: 0.4 10*3/uL (ref 0.1–0.9)
Monocytes: 8 %
Neutrophils Absolute: 2.6 10*3/uL (ref 1.4–7.0)
Neutrophils: 46 %
Platelets: 210 10*3/uL (ref 150–450)
RBC: 4.49 x10E6/uL (ref 4.14–5.80)
RDW: 12.8 % (ref 11.6–15.4)
WBC: 5.6 10*3/uL (ref 3.4–10.8)

## 2019-02-17 LAB — COMPREHENSIVE METABOLIC PANEL
ALT: 36 IU/L (ref 0–44)
AST: 36 IU/L (ref 0–40)
Albumin/Globulin Ratio: 1.3 (ref 1.2–2.2)
Albumin: 3.8 g/dL (ref 3.6–4.6)
Alkaline Phosphatase: 106 IU/L (ref 39–117)
BUN/Creatinine Ratio: 21 (ref 10–24)
BUN: 17 mg/dL (ref 8–27)
Bilirubin Total: 0.4 mg/dL (ref 0.0–1.2)
CO2: 22 mmol/L (ref 20–29)
Calcium: 9.4 mg/dL (ref 8.6–10.2)
Chloride: 102 mmol/L (ref 96–106)
Creatinine, Ser: 0.82 mg/dL (ref 0.76–1.27)
GFR calc Af Amer: 96 mL/min/{1.73_m2} (ref >59)
GFR calc non Af Amer: 83 mL/min/{1.73_m2} (ref >59)
Globulin, Total: 3 g/dL (ref 1.5–4.5)
Glucose: 106 mg/dL — ABNORMAL HIGH (ref 65–99)
Potassium: 4.2 mmol/L (ref 3.5–5.2)
Sodium: 137 mmol/L (ref 134–144)
Total Protein: 6.8 g/dL (ref 6.0–8.5)

## 2019-02-17 LAB — LIPID PANEL
Chol/HDL Ratio: 2.7 ratio (ref 0.0–5.0)
Cholesterol, Total: 167 mg/dL (ref 100–199)
HDL: 61 mg/dL (ref >39)
LDL Chol Calc (NIH): 95 mg/dL (ref 0–99)
Triglycerides: 56 mg/dL (ref 0–149)
VLDL Cholesterol Cal: 11 mg/dL (ref 5–40)

## 2019-02-17 LAB — HEMOGLOBIN A1C
Est. average glucose Bld gHb Est-mCnc: 126 mg/dL
Hgb A1c MFr Bld: 6 % — ABNORMAL HIGH (ref 4.8–5.6)

## 2019-02-17 LAB — TSH: TSH: 0.78 u[IU]/mL (ref 0.450–4.500)

## 2019-03-01 ENCOUNTER — Other Ambulatory Visit: Payer: Self-pay | Admitting: Family Medicine

## 2019-03-01 NOTE — Telephone Encounter (Signed)
[facility] faxed refill request for the following medications:  1. gabapentin (NEURONTIN) 300 MG capsule  2. simvastatin (ZOCOR) 40 MG tablet  90 day supply  LOV: 02/16/2019 Please advise. Thanks TNP

## 2019-03-02 MED ORDER — GABAPENTIN 300 MG PO CAPS: 300.0000 mg | ORAL_CAPSULE | Freq: Every day | ORAL | 3 refills | Status: DC

## 2019-03-02 MED ORDER — SIMVASTATIN 40 MG PO TABS: 40.0000 mg | ORAL_TABLET | Freq: Every day | ORAL | 3 refills | Status: DC

## 2019-03-04 ENCOUNTER — Other Ambulatory Visit: Payer: Self-pay | Admitting: Family Medicine

## 2019-03-04 MED ORDER — SIMVASTATIN 40 MG PO TABS: 40.0000 mg | ORAL_TABLET | Freq: Every day | ORAL | 3 refills | Status: DC

## 2019-03-04 MED ORDER — GABAPENTIN 300 MG PO CAPS: 300.0000 mg | ORAL_CAPSULE | Freq: Every day | ORAL | 3 refills | Status: DC

## 2019-03-04 NOTE — Telephone Encounter (Signed)
[facility] faxed refill request for the following medications:  gabapentin (NEURONTIN) 300 MG capsule simvastatin (ZOCOR) 40 MG tablet  Please advise. Marland Kitchen Thanks, [place]

## 2019-03-21 DIAGNOSIS — Z20828 Contact with and (suspected) exposure to other viral communicable diseases: Secondary | ICD-10-CM | POA: Diagnosis not present

## 2019-03-21 DIAGNOSIS — R05 Cough: Secondary | ICD-10-CM | POA: Diagnosis not present

## 2019-03-22 DIAGNOSIS — Z20828 Contact with and (suspected) exposure to other viral communicable diseases: Secondary | ICD-10-CM | POA: Diagnosis not present

## 2019-03-24 DIAGNOSIS — R2681 Unsteadiness on feet: Secondary | ICD-10-CM | POA: Diagnosis not present

## 2019-03-24 DIAGNOSIS — E1142 Type 2 diabetes mellitus with diabetic polyneuropathy: Secondary | ICD-10-CM | POA: Diagnosis not present

## 2019-03-24 DIAGNOSIS — M21371 Foot drop, right foot: Secondary | ICD-10-CM | POA: Diagnosis not present

## 2019-06-22 DIAGNOSIS — E1140 Type 2 diabetes mellitus with diabetic neuropathy, unspecified: Secondary | ICD-10-CM | POA: Diagnosis not present

## 2019-06-22 DIAGNOSIS — I442 Atrioventricular block, complete: Secondary | ICD-10-CM | POA: Diagnosis not present

## 2019-06-22 DIAGNOSIS — M21371 Foot drop, right foot: Secondary | ICD-10-CM | POA: Diagnosis not present

## 2019-06-22 DIAGNOSIS — E7849 Other hyperlipidemia: Secondary | ICD-10-CM | POA: Diagnosis not present

## 2019-06-22 DIAGNOSIS — I10 Essential (primary) hypertension: Secondary | ICD-10-CM | POA: Diagnosis not present

## 2019-08-13 ENCOUNTER — Other Ambulatory Visit: Payer: Self-pay | Admitting: Family Medicine

## 2019-08-13 NOTE — Telephone Encounter (Signed)
Requested Prescriptions  Pending Prescriptions Disp Refills  . finasteride (PROSCAR) 5 MG tablet [Pharmacy Med Name: FINASTERIDE 5 MG Tablet] 90 tablet 1    Sig: TAKE 1 TABLET AT BEDTIME     Urology: 5-alpha Reductase Inhibitors Passed - 08/13/2019  3:13 PM      Passed - Valid encounter within last 12 months    Recent Outpatient Visits          5 months ago Annual physical exam   [facility] [name]., MD   1 year ago Encounter for annual physical exam   [facility] [name]., MD   2 years ago Type 2 diabetes mellitus with diabetic neuropathy, without long-term current use of insulin [facility])   [facility] [name]., MD   2 years ago Essential (primary) hypertension   [facility] [name]., MD   2 years ago Type 2 diabetes mellitus with diabetic neuropathy, without long-term current use of insulin [facility])   [facility] [name]., MD      Future Appointments            In 3 days [name]., MD [facility]           Given #90; no refills.  Pt. has 6 mo. F/u appt. on 08/16/19

## 2019-08-16 ENCOUNTER — Other Ambulatory Visit: Payer: Self-pay

## 2019-08-16 ENCOUNTER — Telehealth: Payer: Self-pay

## 2019-08-16 ENCOUNTER — Ambulatory Visit (INDEPENDENT_AMBULATORY_CARE_PROVIDER_SITE_OTHER): Payer: Medicare HMO | Admitting: Family Medicine

## 2019-08-16 ENCOUNTER — Encounter: Payer: Self-pay | Admitting: Family Medicine

## 2019-08-16 VITALS — BP 122/69 | HR 68 | Temp 97.1°F | Resp 18 | Ht 66.0 in | Wt 177.0 lb

## 2019-08-16 DIAGNOSIS — J309 Allergic rhinitis, unspecified: Secondary | ICD-10-CM

## 2019-08-16 DIAGNOSIS — E7849 Other hyperlipidemia: Secondary | ICD-10-CM

## 2019-08-16 DIAGNOSIS — I10 Essential (primary) hypertension: Secondary | ICD-10-CM | POA: Diagnosis not present

## 2019-08-16 DIAGNOSIS — E1140 Type 2 diabetes mellitus with diabetic neuropathy, unspecified: Secondary | ICD-10-CM

## 2019-08-16 LAB — POCT GLYCOSYLATED HEMOGLOBIN (HGB A1C)
Est. average glucose Bld gHb Est-mCnc: 134
Hemoglobin A1C: 6.3 % — AB (ref 4.0–5.6)

## 2019-08-16 MED ORDER — LORATADINE 10 MG PO TABS: 10.0000 mg | ORAL_TABLET | Freq: Every day | ORAL | 3 refills | Status: AC

## 2019-08-16 NOTE — Progress Notes (Signed)
Patient: Todd Ortega Male    DOB: April 09, 1935   85 y.o.   MRN: [id number] Visit Date: 08/16/2019  Today's Provider: [name], MD   Chief Complaint  Patient presents with  . Follow-up  . Diabetes  . Hypertension  . Hyperlipidemia  . Hypothyroidism   Subjective:     HPI  Overall patient is stable.  He feels fairly well.  He has chronic neuropathy and a right foot drop but he is not falling.  He is still working part-time cleaning an office at night.  He feels well.  He does have some nocturnal postnasal drainage for which she has to clear his throat. Type 2 diabetes mellitus with diabetic neuropathy, without long-term current use of insulin ([place]) From 02/16/2019-Hemoglobin A1c 6.0.  Essential (primary) hypertension From 02/16/2019-Controlled. Labs checked-good.  Other hyperlipidemia From 02/16/2019-Zocor. Labs checked-good.  Hyperthyroidism From 02/16/2019-Labs checked-good.  Neuropathy From 02/16/2019-Labs checked-good.   Allergies  Allergen Reactions  . Venlafaxine     Other reaction(s): Other (See Comments) Sweating, stomach upset, constipation     Current Outpatient Medications:  .  acetaminophen (TYLENOL) 325 MG tablet, Take 650 mg by mouth every 6 (six) hours as needed., Disp: , Rfl:  .  aspirin 81 MG tablet, Take 81 mg by mouth daily. , Disp: , Rfl:  .  Calcium Carb-Cholecalciferol (CALCIUM 500+D3 PO), Take 1 tablet by mouth daily., Disp: , Rfl:  .  Cholecalciferol (VITAMIN D3) 2000 UNITS capsule, Take 2,000 Units by mouth daily. , Disp: , Rfl:  .  cyanocobalamin 100 MCG tablet, Take 100 mcg by mouth daily., Disp: , Rfl:  .  finasteride (PROSCAR) 5 MG tablet, TAKE 1 TABLET AT BEDTIME, Disp: 90 tablet, Rfl: 0 .  gabapentin (NEURONTIN) 300 MG capsule, Take 1 capsule (300 mg total) by mouth daily., Disp: 90 capsule, Rfl: 3 .  glucose blood test strip, BAYER CONTOUR TEST (In Vitro Strip)  1 (one) Strip Strip check sugars once daily, or as needed for  0 days  Quantity: 300;  Refills: 4   Ordered :31-Mar-2013  [name] ;  Started 31-Mar-2013 Active Comments: Medication taken as needed., Disp: , Rfl:  .  lisinopril (PRINIVIL,ZESTRIL) 10 MG tablet, Take 10 mg by mouth daily. , Disp: , Rfl:  .  Multiple Vitamin (MULTIVITAMIN) capsule, Take 1 capsule by mouth daily., Disp: , Rfl:  .  simvastatin (ZOCOR) 40 MG tablet, Take 1 tablet (40 mg total) by mouth daily., Disp: 90 tablet, Rfl: 3 .  tamsulosin (FLOMAX) 0.4 MG CAPS capsule, TAKE 1 CAPSULE (0.4 MG TOTAL) BY MOUTH DAILY AFTER BREAKFAST., Disp: 90 capsule, Rfl: 3 .  levothyroxine (SYNTHROID, LEVOTHROID) 175 MCG tablet, Take 1 tablet (175 mcg total) by mouth daily before breakfast. (Patient not taking: Reported on 08/16/2019), Disp: 30 tablet, Rfl: 6 .  methimazole (TAPAZOLE) 5 MG tablet, Take 2.5 mg by mouth daily. , Disp: , Rfl:   Review of Systems  Constitutional: Negative for appetite change, chills and fever.  HENT: Negative.   Eyes: Negative.   Respiratory: Negative for chest tightness, shortness of breath and wheezing.   Cardiovascular: Negative for chest pain and palpitations.  Gastrointestinal: Negative for abdominal pain, nausea and vomiting.  Endocrine: Negative.   Allergic/Immunologic: Negative.   Hematological: Negative.   Psychiatric/Behavioral: Negative.     Social History   Tobacco Use  . Smoking status: Never Smoker  . Smokeless tobacco: Never Used  Substance Use Topics  . Alcohol use: No  Alcohol/week: 0.0 standard drinks      Objective:   BP 122/69 (BP Location: Right Arm, Patient Position: Sitting, Cuff Size: Large)   Pulse 68   Temp (!) 97.1 F (36.2 C) (Other (Comment))   Resp 18   Ht 5\' 6"  (1.676 m)   Wt 177 lb (80.3 kg)   SpO2 96%   BMI 28.57 kg/m  Vitals:   08/16/19 1350  BP: 122/69  Pulse: 68  Resp: 18  Temp: (!) 97.1 F (36.2 C)  TempSrc: Other (Comment)  SpO2: 96%  Weight: 177 lb (80.3 kg)  Height: 5\' 6"  (1.676 m)  Body mass index  is 28.57 kg/m.   Physical Exam Vitals and nursing note reviewed.  Constitutional:      Appearance: He is well-developed.  HENT:     Head: Normocephalic and atraumatic.     Right Ear: External ear normal.     Left Ear: External ear normal.     Nose: Nose normal.  Eyes:     Conjunctiva/sclera: Conjunctivae normal.     Pupils: Pupils are equal, round, and reactive to light.  Cardiovascular:     Rate and Rhythm: Normal rate and regular rhythm.     Heart sounds: Normal heart sounds.  Pulmonary:     Effort: Pulmonary effort is normal.     Breath sounds: Normal breath sounds.  Abdominal:     General: Bowel sounds are normal.     Palpations: Abdomen is soft.  Genitourinary:    Penis: Normal.      Prostate: Normal.     Rectum: Normal.  Musculoskeletal:        General: Normal range of motion.     Cervical back: Normal range of motion and neck supple.  Skin:    General: Skin is warm and dry.     Comments: Left upper back with sebaceous cyst.  Neurological:     Mental Status: He is alert and oriented to person, place, and time.     Cranial Nerves: No cranial nerve deficit.     Coordination: Coordination abnormal.     Comments: Pt has difficulty with walking. He has a right foot drop. He has decreased sensation to both feet.  Psychiatric:        Behavior: Behavior normal.        Thought Content: Thought content normal.        Judgment: Judgment normal.      No results found for any visits on 08/16/19.     Assessment & Plan    1. Type 2 diabetes mellitus with diabetic neuropathy, without long-term current use of insulin (HCC) Good control with A1c of 6.3.  Continue to work on good habits.  Consider Metformin if it rises. - POCT HgB A1C 6.3 today.  2. Allergic rhinitis, unspecified seasonality, unspecified trigger Try loratadine 10 mg daily.  - loratadine (CLARITIN) 10 MG tablet; Take 1 tablet (10 mg total) by mouth daily.  Dispense: 90 tablet; Refill: 3 3.   Hypertension On lisinopril 4.  Hyperlipidemia 5.  Hyperthyroid On methimazole. Per Dr. Eddie Dibbles from endocrinology.  Last TSH was normal.  Follow up in 6 months    I,Kelisha Dall,acting as a scribe for [name], MD.,have documented all relevant documentation on the behalf of [name], MD,as directed by  [name], MD while in the presence of [name], MD.     [name], MD  [facility]

## 2019-08-16 NOTE — Telephone Encounter (Signed)
Copied from [place] [id number]. Topic: General - Other >> Aug 16, 2019  3:53 PM Greggory Keen D wrote: Reason for CRM: Pt's wife called asking about the medication that her husband was prescribed today.  She said she looked at his patient summary and the medication listed is for depression and he went in for allergies.  The medication venlafaxine or Effexor.  Please call wife back at [phone]

## 2019-08-16 NOTE — Telephone Encounter (Signed)
Patient's wife advised we sent loratadine 10 mg to pharmacy.

## 2019-10-11 ENCOUNTER — Other Ambulatory Visit: Payer: Self-pay | Admitting: Family Medicine

## 2019-10-11 ENCOUNTER — Other Ambulatory Visit: Payer: Self-pay

## 2019-10-11 DIAGNOSIS — N138 Other obstructive and reflux uropathy: Secondary | ICD-10-CM

## 2019-10-11 MED ORDER — FINASTERIDE 5 MG PO TABS: 5.0000 mg | ORAL_TABLET | Freq: Every day | ORAL | 1 refills | Status: DC

## 2019-11-10 ENCOUNTER — Telehealth: Payer: Self-pay

## 2019-11-10 NOTE — Telephone Encounter (Signed)
Form ready to pick-up. Patient advised.

## 2019-11-10 NOTE — Telephone Encounter (Signed)
Copied from [place] [id number]. Topic: General - Inquiry >> Nov 10, 2019  9:52 AM Richardo Priest, NT wrote: Reason for CRM: Patient called in in regards to handicap forms he dropped off last Tuesday. Patient is wondering if they are ready for pick up and for a nurse to call back to discuss. Please advise.

## 2019-12-14 DIAGNOSIS — I442 Atrioventricular block, complete: Secondary | ICD-10-CM | POA: Diagnosis not present

## 2019-12-20 DIAGNOSIS — E782 Mixed hyperlipidemia: Secondary | ICD-10-CM | POA: Diagnosis not present

## 2019-12-20 DIAGNOSIS — E1140 Type 2 diabetes mellitus with diabetic neuropathy, unspecified: Secondary | ICD-10-CM | POA: Diagnosis not present

## 2019-12-20 DIAGNOSIS — I442 Atrioventricular block, complete: Secondary | ICD-10-CM | POA: Diagnosis not present

## 2019-12-20 DIAGNOSIS — I10 Essential (primary) hypertension: Secondary | ICD-10-CM | POA: Diagnosis not present

## 2019-12-20 DIAGNOSIS — M21371 Foot drop, right foot: Secondary | ICD-10-CM | POA: Diagnosis not present

## 2020-01-20 ENCOUNTER — Other Ambulatory Visit: Payer: Self-pay | Admitting: Family Medicine

## 2020-02-23 NOTE — Progress Notes (Signed)
Subjective:   Todd Ortega is a 85 y.o. male who presents for Medicare Annual/Subsequent preventive examination.  Review of Systems    N/A  Cardiac Risk Factors include: advanced age (>61men, >69 women);diabetes mellitus;hypertension;male gender     Objective:    Today's Vitals   02/24/20 0814  BP: 124/64  Pulse: 61  Temp: 98.4 F (36.9 C)  TempSrc: Oral  SpO2: 96%  Weight: 168 lb 6.4 oz (76.4 kg)  Height: 5\' 6"  (1.676 m)  PainSc: 0-No pain   Body mass index is 27.18 kg/m.  Advanced Directives 02/24/2020 02/15/2019 02/12/2018 04/14/2017 04/12/2016 06/06/2015 04/20/2015  Does Patient Have a Medical Advance Directive? Yes No Yes Yes Yes No No  Type of Paramedic of [place];Living will - [place];Living will Living will Living will - -  Does patient want to make changes to medical advance directive? - No - Patient declined - - - - -  Copy of [place] in Chart? No - copy requested - No - copy requested - No - copy requested - -  Would patient like information on creating a medical advance directive? - - - - - No - patient declined information No - patient declined information    Current Medications (verified) Outpatient Encounter Medications as of 02/24/2020  Medication Sig  . acetaminophen (TYLENOL) 325 MG tablet Take 650 mg by mouth every 6 (six) hours as needed.  Marland Kitchen aspirin 81 MG tablet Take 81 mg by mouth daily.   . Calcium Carb-Cholecalciferol (CALCIUM 500+D3 PO) Take 1 tablet by mouth daily.  . Cholecalciferol (VITAMIN D3) 2000 UNITS capsule Take 2,000 Units by mouth daily.   . cyanocobalamin 100 MCG tablet Take 100 mcg by mouth daily.  . finasteride (PROSCAR) 5 MG tablet Take 1 tablet (5 mg total) by mouth at bedtime.  . gabapentin (NEURONTIN) 300 MG capsule Take 1 capsule (300 mg total) by mouth daily.  Marland Kitchen lisinopril (PRINIVIL,ZESTRIL) 10 MG tablet Take 10 mg by mouth daily.   Marland Kitchen loratadine (CLARITIN) 10 MG  tablet Take 1 tablet (10 mg total) by mouth daily.  . methimazole (TAPAZOLE) 5 MG tablet Take 2.5 mg by mouth daily.   . Multiple Vitamin (MULTIVITAMIN) capsule Take 1 capsule by mouth daily.  . simvastatin (ZOCOR) 40 MG tablet TAKE 1 TABLET EVERY DAY  . tamsulosin (FLOMAX) 0.4 MG CAPS capsule TAKE 1 CAPSULE (0.4 MG TOTAL) BY MOUTH DAILY AFTER BREAKFAST.  Marland Kitchen glucose blood test strip BAYER CONTOUR TEST (In Vitro Strip)  1 (one) Strip Strip check sugars once daily, or as needed for 0 days  Quantity: 300;  Refills: 4   Ordered :31-Mar-2013  [name] ;  Started 31-Mar-2013 Active Comments: Medication taken as needed. (Patient not taking: Reported on 02/24/2020)  . levothyroxine (SYNTHROID, LEVOTHROID) 175 MCG tablet Take 1 tablet (175 mcg total) by mouth daily before breakfast. (Patient not taking: Reported on 08/16/2019)   No facility-administered encounter medications on file as of 02/24/2020.    Allergies (verified) Venlafaxine   History: Past Medical History:  Diagnosis Date  . Arthritis   . BPH (benign prostatic hypertrophy)   . Chronic kidney disease   . Complete heart block ([place])   . Coronary artery disease   . Diabetes mellitus without complication ([place])   . Foot drop, right foot   . Hyperlipidemia   . Hypertension   . Hyperthyroidism   . Nephrolithiasis   . Neuropathy   . Shortness of breath dyspnea   .  Sleep apnea    Past Surgical History:  Procedure Laterality Date  . CARDIAC CATHETERIZATION    . HERNIA REPAIR Bilateral    Inguinal Hernia Repair  . MASS EXCISION     removed from left hand  . PACEMAKER INSERTION N/A 04/20/2015   Procedure: INSERTION PACEMAKER;  Surgeon: Isaias Cowman, MD;  Location: [facility] ORS;  Service: Cardiovascular;  Laterality: N/A;   Family History  Problem Relation Age of Onset  . Heart attack Mother   . Gout Mother   . Diabetes Mother   . Hypertension Mother   . Hyperlipidemia Mother   . Lung cancer Father   . Drug abuse Brother      Social History   Socioeconomic History  . Marital status: Married    Spouse name: Not on file  . Number of children: 2  . Years of education: Not on file  . Highest education level: Some college, no degree  Occupational History  . Occupation: retired  . Occupation: Tax inspector    Comment: part time  Tobacco Use  . Smoking status: Never Smoker  . Smokeless tobacco: Never Used  Vaping Use  . Vaping Use: Never used  Substance and Sexual Activity  . Alcohol use: No    Alcohol/week: 0.0 standard drinks  . Drug use: No  . Sexual activity: Not on file  Other Topics Concern  . Not on file  Social History Narrative  . Not on file   Social Determinants of Health   Financial Resource Strain: Low Risk   . Difficulty of Paying Living Expenses: Not hard at all  Food Insecurity: No Food Insecurity  . Worried About Charity fundraiser in the Last Year: Never true  . Ran Out of Food in the Last Year: Never true  Transportation Needs: No Transportation Needs  . Lack of Transportation (Medical): No  . Lack of Transportation (Non-Medical): No  Physical Activity: Inactive  . Days of Exercise per Week: 0 days  . Minutes of Exercise per Session: 0 min  Stress: No Stress Concern Present  . Feeling of Stress : Not at all  Social Connections: Socially Integrated  . Frequency of Communication with Friends and Family: More than three times a week  . Frequency of Social Gatherings with Friends and Family: More than three times a week  . Attends Religious Services: More than 4 times per year  . Active Member of Clubs or Organizations: Yes  . Attends Archivist Meetings: More than 4 times per year  . Marital Status: Married    Tobacco Counseling Counseling given: Not Answered   Clinical Intake:  Pre-visit preparation completed: Yes  Pain : No/denies pain Pain Score: 0-No pain     Nutritional Status: BMI 25 -29 Overweight Nutritional Risks: None Diabetes:  Yes  How often do you need to have someone help you when you read instructions, pamphlets, or other written materials from your doctor or pharmacy?: 1 - Never  Diabetic? Borderline  Nutrition Risk Assessment:  Has the patient had any N/V/D within the last 2 months?  No  Does the patient have any non-healing wounds?  No  Has the patient had any unintentional weight loss or weight gain?  No   Diabetes:  Is the patient diabetic?  Yes  If diabetic, was a CBG obtained today?  No  Did the patient bring in their glucometer from home?  No  How often do you monitor your CBG's? Once a month.   Financial  Strains and Diabetes Management:  Are you having any financial strains with the device, your supplies or your medication? No .  Does the patient want to be seen by Chronic Care Management for management of their diabetes?  No  Would the patient like to be referred to a Nutritionist or for Diabetic Management?  No   Diabetic Exams:  Diabetic Eye Exam: Overdue for diabetic eye exam. Pt has been advised about the importance in completing this exam. Patient advised to call and schedule an eye exam. Diabetic Foot Exam: Done 03/24/19.   Interpreter Needed?: No  Information entered by :: [facility], LPN   Activities of Daily Living In your present state of health, do you have any difficulty performing the following activities: 02/24/2020  Hearing? N  Vision? Y  Comment Due to cataract on right eye.  Difficulty concentrating or making decisions? N  Walking or climbing stairs? Y  Comment Due to neuropathy in feet.  Dressing or bathing? N  Doing errands, shopping? N  Preparing Food and eating ? N  Using the Toilet? N  In the past six months, have you accidently leaked urine? N  Do you have problems with loss of bowel control? N  Managing your Medications? N  Managing your Finances? N  Housekeeping or managing your Housekeeping? N  Some recent data might be hidden    Patient Care  Team: [name]., MD as PCP - General (Family Medicine) [name], MD as Consulting Physician (Cardiology) [name], MD as Consulting Physician (Neurology) [name] [name] (Neurology)  Indicate any recent Medical Services you may have received from other than [facility] providers in the past year (date may be approximate).     Assessment:   This is a routine wellness examination for Todd Ortega.  Hearing/Vision screen No exam data present  Dietary issues and exercise activities discussed: Current Exercise Habits: The patient does not participate in regular exercise at present, Exercise limited by: neurologic condition(s)  Goals    . Increase water intake     Recommend increasing water intake to 4-6 glasses a day.       Depression Screen PHQ 2/9 Scores 02/24/2020 02/15/2019 02/12/2018 04/14/2017 04/12/2016 04/18/2015  PHQ - 2 Score 0 0 1 0 0 0    Fall Risk Fall Risk  02/24/2020 02/15/2019 02/12/2018 04/14/2017 04/12/2016  Falls in the past year? 0 1 Yes No Yes  Number falls in past yr: 0 0 2 or more - 2 or more  Injury with Fall? 0 0 No - No  Risk Factor Category  - - - - High Fall Risk  Comment - - - - neuropathy  Risk for fall due to : - - Other (Comment) - -  Risk for fall due to: Comment - - neuropathy - -  Follow up - Falls prevention discussed Falls prevention discussed - -    Any stairs in or around the home? Yes  If so, are there any without handrails? No  Home free of loose throw rugs in walkways, pet beds, electrical cords, etc? Yes  Adequate lighting in your home to reduce risk of falls? Yes   ASSISTIVE DEVICES UTILIZED TO PREVENT FALLS:  Life alert? No  Use of a cane, walker or w/c? Yes  Grab bars in the bathroom? Yes  Shower chair or bench in shower? Yes  Elevated toilet seat or a handicapped toilet? Yes   TIMED UP AND GO:  Was the test performed? Yes .  Length of time to ambulate 10 feet: 10 sec.   Gait slow and steady without use  of assistive device  Cognitive Function: Declined today.      6CIT Screen 04/12/2016  What Year? 0 points  What month? 0 points  What time? 0 points  Count back from 20 0 points  Months in reverse 0 points  Repeat phrase 2 points  Total Score 2    Immunizations Immunization History  Administered Date(s) Administered  . Fluad Quad(high Dose 65+) 02/16/2019, 02/24/2020  . Influenza, High Dose Seasonal PF 03/31/2016, 02/12/2018  . Influenza,inj,Quad PF,6+ Mos 04/21/2015  . Pneumococcal Conjugate-13 03/07/2014  . Pneumococcal Polysaccharide-23 12/04/2011  . Tdap 02/23/2011    TDAP status: Up to date Flu Vaccine status: Up to date Pneumococcal vaccine status: Up to date Covid-19 vaccine status: Completed vaccines  Qualifies for Shingles Vaccine? Yes   Zostavax completed No   Shingrix Completed?: No.    Education has been provided regarding the importance of this vaccine. Patient has been advised to call insurance company to determine out of pocket expense if they have not yet received this vaccine. Advised may also receive vaccine at local pharmacy or Health Dept. Verbalized acceptance and understanding.  Screening Tests Health Maintenance  Topic Date Due  . COVID-19 Vaccine (1) Never done  . OPHTHALMOLOGY EXAM  10/01/2016  . HEMOGLOBIN A1C  02/16/2020  . FOOT EXAM  03/23/2020  . TETANUS/TDAP  02/22/2021  . INFLUENZA VACCINE  Completed  . PNA vac Low Risk Adult  Completed    Health Maintenance  Health Maintenance Due  Topic Date Due  . COVID-19 Vaccine (1) Never done  . OPHTHALMOLOGY EXAM  10/01/2016  . HEMOGLOBIN A1C  02/16/2020    Colorectal cancer screening: No longer required.   Lung Cancer Screening: (Low Dose CT Chest recommended if Age 74-80 years, 30 pack-year currently smoking OR have quit w/in 15years.) does not qualify.    Additional Screening:  Vision Screening: Recommended annual ophthalmology exams for early detection of glaucoma and other  disorders of the eye. Is the patient up to date with their annual eye exam? No Who is the provider or what is the name of the office in which the patient attends annual eye exams? Pt is currently in the process of finding another eye doctor. Pt to check with PCP for suggestions. If pt is not established with a provider, would they like to be referred to a provider to establish care? No .   Dental Screening: Recommended annual dental exams for proper oral hygiene  Community Resource Referral / Chronic Care Management: CRR required this visit?  No   CCM required this visit?  No      Plan:     I have personally reviewed and noted the following in the patient's chart:   . Medical and social history . Use of alcohol, tobacco or illicit drugs  . Current medications and supplements . Functional ability and status . Nutritional status . Physical activity . Advanced directives . List of other physicians . Hospitalizations, surgeries, and ER visits in previous 12 months . Vitals . Screenings to include cognitive, depression, and falls . Referrals and appointments  In addition, I have reviewed and discussed with patient certain preventive protocols, quality metrics, and best practice recommendations. A written personalized care plan for preventive services as well as general preventive health recommendations were provided to patient.     Deshayla Empson [place], [place]   09/02/270   Nurse Notes: Pt is  due for his annual eye exam. Pt would like to speak with PCP for suggestions on ophthalmologists in the area. Requested Covid vaccine card information to up date chart.

## 2020-02-23 NOTE — Progress Notes (Signed)
I,Kaydan Wilhoite,acting as a scribe for [name], MD.,have documented all relevant documentation on the behalf of [name], MD,as directed by  [name], MD while in the presence of [name], MD.   Complete physical exam   Patient: Todd Ortega   DOB: Dec 26, 1934   85 y.o. Male  MRN: [id number] Visit Date: 02/24/2020  Today's healthcare provider: [name], MD   Chief Complaint  Patient presents with  . Annual Exam   Subjective    Todd Ortega is a 85 y.o. male who presents today for a complete physical exam.  He reports consuming a general diet. The patient does not participate in regular exercise at present. He generally feels well. He reports sleeping fairly well. He does not have additional problems to discuss today Patient needs referral to new local eye doctor.  His insurance no longer covers [facility].Marland Kitchen  HPI  Patient had AWV with [facility] today at 8:20 am.  Past Medical History:  Diagnosis Date  . Arthritis   . BPH (benign prostatic hypertrophy)   . Chronic kidney disease   . Complete heart block ([place])   . Coronary artery disease   . Diabetes mellitus without complication ([place])   . Foot drop, right foot   . Hyperlipidemia   . Hypertension   . Hyperthyroidism   . Nephrolithiasis   . Neuropathy   . Shortness of breath dyspnea   . Sleep apnea    Past Surgical History:  Procedure Laterality Date  . CARDIAC CATHETERIZATION    . HERNIA REPAIR Bilateral    Inguinal Hernia Repair  . MASS EXCISION     removed from left hand  . PACEMAKER INSERTION N/A 04/20/2015   Procedure: INSERTION PACEMAKER;  Surgeon: Isaias Cowman, MD;  Location: [facility] ORS;  Service: Cardiovascular;  Laterality: N/A;   Social History   Socioeconomic History  . Marital status: Married    Spouse name: Not on file  . Number of children: 2  . Years of education: Not on file  . Highest education level: Some college, no degree  Occupational  History  . Occupation: retired  . Occupation: Tax inspector    Comment: part time  Tobacco Use  . Smoking status: Never Smoker  . Smokeless tobacco: Never Used  Vaping Use  . Vaping Use: Never used  Substance and Sexual Activity  . Alcohol use: No    Alcohol/week: 0.0 standard drinks  . Drug use: No  . Sexual activity: Not on file  Other Topics Concern  . Not on file  Social History Narrative  . Not on file   Social Determinants of Health   Financial Resource Strain: Low Risk   . Difficulty of Paying Living Expenses: Not hard at all  Food Insecurity: No Food Insecurity  . Worried About Charity fundraiser in the Last Year: Never true  . Ran Out of Food in the Last Year: Never true  Transportation Needs: No Transportation Needs  . Lack of Transportation (Medical): No  . Lack of Transportation (Non-Medical): No  Physical Activity: Inactive  . Days of Exercise per Week: 0 days  . Minutes of Exercise per Session: 0 min  Stress: No Stress Concern Present  . Feeling of Stress : Not at all  Social Connections: Socially Integrated  . Frequency of Communication with Friends and Family: More than three times a week  . Frequency of Social Gatherings with Friends and Family: More than three times  a week  . Attends Religious Services: More than 4 times per year  . Active Member of Clubs or Organizations: Yes  . Attends Archivist Meetings: More than 4 times per year  . Marital Status: Married  Human resources officer Violence: Not At Risk  . Fear of Current or Ex-Partner: No  . Emotionally Abused: No  . Physically Abused: No  . Sexually Abused: No   Family Status  Relation Name Status  . Mother  Deceased  . Father  Deceased  . Brother  Deceased  . Son  Alive  . Brother  Alive  . Son  Alive   Family History  Problem Relation Age of Onset  . Heart attack Mother   . Gout Mother   . Diabetes Mother   . Hypertension Mother   . Hyperlipidemia Mother   . Lung cancer  Father   . Drug abuse Brother    Allergies  Allergen Reactions  . Venlafaxine     Other reaction(s): Other (See Comments) Sweating, stomach upset, constipation    Patient Care Team: [name]., MD as PCP - General (Family Medicine) [name], MD as Consulting Physician (Cardiology) [name], MD as Consulting Physician (Neurology) Sunday Corn Mervin Kung (Neurology)   Medications: Outpatient Medications Prior to Visit  Medication Sig  . acetaminophen (TYLENOL) 325 MG tablet Take 650 mg by mouth every 6 (six) hours as needed.  Marland Kitchen aspirin 81 MG tablet Take 81 mg by mouth daily.   . Calcium Carb-Cholecalciferol (CALCIUM 500+D3 PO) Take 1 tablet by mouth daily.  . Cholecalciferol (VITAMIN D3) 2000 UNITS capsule Take 2,000 Units by mouth daily.   . cyanocobalamin 100 MCG tablet Take 100 mcg by mouth daily.  . finasteride (PROSCAR) 5 MG tablet Take 1 tablet (5 mg total) by mouth at bedtime.  . gabapentin (NEURONTIN) 300 MG capsule Take 1 capsule (300 mg total) by mouth daily.  Marland Kitchen glucose blood test strip BAYER CONTOUR TEST (In Vitro Strip)  1 (one) Strip Strip check sugars once daily, or as needed for 0 days  Quantity: 300;  Refills: 4   Ordered :31-Mar-2013  [name] ;  Started 31-Mar-2013 Active Comments: Medication taken as needed. (Patient not taking: Reported on 02/24/2020)  . levothyroxine (SYNTHROID, LEVOTHROID) 175 MCG tablet Take 1 tablet (175 mcg total) by mouth daily before breakfast. (Patient not taking: Reported on 08/16/2019)  . lisinopril (PRINIVIL,ZESTRIL) 10 MG tablet Take 10 mg by mouth daily.   Marland Kitchen loratadine (CLARITIN) 10 MG tablet Take 1 tablet (10 mg total) by mouth daily.  . methimazole (TAPAZOLE) 5 MG tablet Take 2.5 mg by mouth daily.   . Multiple Vitamin (MULTIVITAMIN) capsule Take 1 capsule by mouth daily.  . simvastatin (ZOCOR) 40 MG tablet TAKE 1 TABLET EVERY DAY  . tamsulosin (FLOMAX) 0.4 MG CAPS capsule TAKE 1 CAPSULE (0.4 MG TOTAL)  BY MOUTH DAILY AFTER BREAKFAST.   No facility-administered medications prior to visit.    Review of Systems    Objective     Vitals:  BP 124/64 (BP Location: Right Arm)  Pulse 61  Temp 98.4 F (36.9 C) (Oral)  Ht 5\' 6"  (1.676 m)  Wt 168 lb 6.4 oz (76.4 kg)  SpO2 96%  BMI 27.18 kg/m  BSA 1.89 m     Physical Exam Vitals and nursing note reviewed.  Constitutional:      Appearance: Normal appearance. He is well-developed.  HENT:     Head: Normocephalic and atraumatic.  Right Ear: External ear normal.     Left Ear: External ear normal.     Nose: Nose normal.  Eyes:     Conjunctiva/sclera: Conjunctivae normal.     Pupils: Pupils are equal, round, and reactive to light.  Cardiovascular:     Rate and Rhythm: Normal rate and regular rhythm.     Heart sounds: Normal heart sounds.  Pulmonary:     Effort: Pulmonary effort is normal.     Breath sounds: Normal breath sounds.  Abdominal:     General: Bowel sounds are normal.     Palpations: Abdomen is soft.  Musculoskeletal:        General: Normal range of motion.     Cervical back: Normal range of motion and neck supple.  Skin:    General: Skin is warm and dry.  Neurological:     Mental Status: He is alert and oriented to person, place, and time.     Cranial Nerves: No cranial nerve deficit.     Coordination: Coordination abnormal.     Comments: Pt has difficulty with walking. He has a right foot drop. He has decreased sensation to both feet.  Psychiatric:        Mood and Affect: Mood normal.        Behavior: Behavior normal.        Thought Content: Thought content normal.        Judgment: Judgment normal.        Last depression screening scores PHQ 2/9 Scores 02/24/2020 02/15/2019 02/12/2018  PHQ - 2 Score 0 0 1   Last fall risk screening Fall Risk  02/24/2020  Falls in the past year? 0  Number falls in past yr: 0  Injury with Fall? 0  Risk Factor Category  -  Comment -  Risk for fall due to : -  Risk  for fall due to: Comment -  Follow up -   Last Audit-C alcohol use screening Alcohol Use Disorder Test (AUDIT) 02/24/2020  1. How often do you have a drink containing alcohol? 0  2. How many drinks containing alcohol do you have on a typical day when you are drinking? 0  3. How often do you have six or more drinks on one occasion? 0  AUDIT-C Score 0  Alcohol Brief Interventions/Follow-up AUDIT Score <7 follow-up not indicated   A score of 3 or more in women, and 4 or more in men indicates increased risk for alcohol abuse, EXCEPT if all of the points are from question 1   No results found for any visits on 02/24/20.  Assessment & Plan    Routine Health Maintenance and Physical Exam  Exercise Activities and Dietary recommendations Goals    . Increase water intake     Recommend increasing water intake to 4-6 glasses a day.        Immunization History  Administered Date(s) Administered  . Fluad Quad(high Dose 65+) 02/16/2019, 02/24/2020  . Influenza, High Dose Seasonal PF 03/31/2016, 02/12/2018  . Influenza,inj,Quad PF,6+ Mos 04/21/2015  . Pneumococcal Conjugate-13 03/07/2014  . Pneumococcal Polysaccharide-23 12/04/2011  . Tdap 02/23/2011    Health Maintenance  Topic Date Due  . COVID-19 Vaccine (1) Never done  . OPHTHALMOLOGY EXAM  10/01/2016  . HEMOGLOBIN A1C  02/16/2020  . FOOT EXAM  03/23/2020  . TETANUS/TDAP  02/22/2021  . INFLUENZA VACCINE  Completed  . PNA vac Low Risk Adult  Completed    Discussed health benefits of physical activity, and  encouraged him to engage in regular exercise appropriate for his age and condition.  1. Annual physical exam Refer to Dr Ellin Mayhew for eye care. - TSH - Lipid panel - Hemoglobin A1c - Comprehensive Metabolic Panel (CMET) - CBC w/Diff/Platelet  2. Type 2 diabetes mellitus with diabetic neuropathy, without long-term current use of insulin (HCC)  - TSH - Lipid panel - Hemoglobin A1c - Comprehensive Metabolic Panel  (CMET) - CBC w/Diff/Platelet  3. Essential (primary) hypertension  - TSH - Lipid panel - Hemoglobin A1c - Comprehensive Metabolic Panel (CMET) - CBC w/Diff/Platelet  4. Other hyperlipidemia  - TSH - Lipid panel - Hemoglobin A1c - Comprehensive Metabolic Panel (CMET) - CBC w/Diff/Platelet  5. Hyperthyroidism  - TSH - Lipid panel - Hemoglobin A1c - Comprehensive Metabolic Panel (CMET) - CBC w/Diff/Platelet   Return in about 6 months (around 08/23/2020).     I, [name], MD, have reviewed all documentation for this visit. The documentation on 02/27/20 for the exam, diagnosis, procedures, and orders are all accurate and complete.    Richard Cranford Mon, MD  [facility] [phone] (phone) [phone] (fax)  [place]

## 2020-02-24 ENCOUNTER — Ambulatory Visit (INDEPENDENT_AMBULATORY_CARE_PROVIDER_SITE_OTHER): Payer: Medicare HMO

## 2020-02-24 ENCOUNTER — Other Ambulatory Visit: Payer: Self-pay

## 2020-02-24 ENCOUNTER — Ambulatory Visit (INDEPENDENT_AMBULATORY_CARE_PROVIDER_SITE_OTHER): Payer: Medicare HMO | Admitting: Family Medicine

## 2020-02-24 VITALS — BP 124/64 | HR 61 | Temp 98.4°F | Ht 66.0 in | Wt 168.4 lb

## 2020-02-24 DIAGNOSIS — E7849 Other hyperlipidemia: Secondary | ICD-10-CM

## 2020-02-24 DIAGNOSIS — Z23 Encounter for immunization: Secondary | ICD-10-CM | POA: Diagnosis not present

## 2020-02-24 DIAGNOSIS — I10 Essential (primary) hypertension: Secondary | ICD-10-CM

## 2020-02-24 DIAGNOSIS — E1140 Type 2 diabetes mellitus with diabetic neuropathy, unspecified: Secondary | ICD-10-CM | POA: Diagnosis not present

## 2020-02-24 DIAGNOSIS — Z0000 Encounter for general adult medical examination without abnormal findings: Secondary | ICD-10-CM

## 2020-02-24 DIAGNOSIS — E0590 Thyrotoxicosis, unspecified without thyrotoxic crisis or storm: Secondary | ICD-10-CM | POA: Diagnosis not present

## 2020-02-24 NOTE — Patient Instructions (Signed)
Todd Ortega , Thank you for taking time to come for your Medicare Wellness Visit. I appreciate your ongoing commitment to your health goals. Please review the following plan we discussed and let me know if I can assist you in the future.   Screening recommendations/referrals: Colonoscopy: No longer required.  Recommended yearly ophthalmology/optometry visit for glaucoma screening and checkup Recommended yearly dental visit for hygiene and checkup  Vaccinations: Influenza vaccine: Administered today. Pneumococcal vaccine: Completed series Tdap vaccine: Up to date, due 02/2021 Shingles vaccine: Shingrix discussed. Please contact your pharmacy for coverage information.     Advanced directives: Please bring a copy of your POA (Power of Attorney) and/or Living Will to your next appointment.   Conditions/risks identified: Recommend to continue to drink 6-8 8 oz glasses of water a day.   Next appointment: 9:00 AM today with Dr Rosanna Randy   Preventive Care 14 Years and Older, Male Preventive care refers to lifestyle choices and visits with your health care provider that can promote health and wellness. What does preventive care include?  A yearly physical exam. This is also called an annual well check.  Dental exams once or twice a year.  Routine eye exams. Ask your health care provider how often you should have your eyes checked.  Personal lifestyle choices, including:  Daily care of your teeth and gums.  Regular physical activity.  Eating a healthy diet.  Avoiding tobacco and drug use.  Limiting alcohol use.  Practicing safe sex.  Taking low doses of aspirin every day.  Taking vitamin and mineral supplements as recommended by your health care provider. What happens during an annual well check? The services and screenings done by your health care provider during your annual well check will depend on your age, overall health, lifestyle risk factors, and family history of  disease. Counseling  Your health care provider may ask you questions about your:  Alcohol use.  Tobacco use.  Drug use.  Emotional well-being.  Home and relationship well-being.  Sexual activity.  Eating habits.  History of falls.  Memory and ability to understand (cognition).  Work and work Statistician. Screening  You may have the following tests or measurements:  Height, weight, and BMI.  Blood pressure.  Lipid and cholesterol levels. These may be checked every 5 years, or more frequently if you are over 61 years old.  Skin check.  Lung cancer screening. You may have this screening every year starting at age 37 if you have a 30-pack-year history of smoking and currently smoke or have quit within the past 15 years.  Fecal occult blood test (FOBT) of the stool. You may have this test every year starting at age 45.  Flexible sigmoidoscopy or colonoscopy. You may have a sigmoidoscopy every 5 years or a colonoscopy every 10 years starting at age 12.  Prostate cancer screening. Recommendations will vary depending on your family history and other risks.  Hepatitis C blood test.  Hepatitis B blood test.  Sexually transmitted disease (STD) testing.  Diabetes screening. This is done by checking your blood sugar (glucose) after you have not eaten for a while (fasting). You may have this done every 1-3 years.  Abdominal aortic aneurysm (AAA) screening. You may need this if you are a current or former smoker.  Osteoporosis. You may be screened starting at age 38 if you are at high risk. Talk with your health care provider about your test results, treatment options, and if necessary, the need for more tests. Vaccines  Your health care provider may recommend certain vaccines, such as:  Influenza vaccine. This is recommended every year.  Tetanus, diphtheria, and acellular pertussis (Tdap, Td) vaccine. You may need a Td booster every 10 years.  Zoster vaccine. You may  need this after age 54.  Pneumococcal 13-valent conjugate (PCV13) vaccine. One dose is recommended after age 45.  Pneumococcal polysaccharide (PPSV23) vaccine. One dose is recommended after age 101. Talk to your health care provider about which screenings and vaccines you need and how often you need them. This information is not intended to replace advice given to you by your health care provider. Make sure you discuss any questions you have with your health care provider. Document Released: 06/16/2015 Document Revised: 02/07/2016 Document Reviewed: 03/21/2015 Elsevier Interactive Patient Education  2017 [place] Prevention in the Home Falls can cause injuries. They can happen to people of all ages. There are many things you can do to make your home safe and to help prevent falls. What can I do on the outside of my home?  Regularly fix the edges of walkways and driveways and fix any cracks.  Remove anything that might make you trip as you walk through a door, such as a raised step or threshold.  Trim any bushes or trees on the path to your home.  Use bright outdoor lighting.  Clear any walking paths of anything that might make someone trip, such as rocks or tools.  Regularly check to see if handrails are loose or broken. Make sure that both sides of any steps have handrails.  Any raised decks and porches should have guardrails on the edges.  Have any leaves, snow, or ice cleared regularly.  Use sand or salt on walking paths during winter.  Clean up any spills in your garage right away. This includes oil or grease spills. What can I do in the bathroom?  Use night lights.  Install grab bars by the toilet and in the tub and shower. Do not use towel bars as grab bars.  Use non-skid mats or decals in the tub or shower.  If you need to sit down in the shower, use a plastic, non-slip stool.  Keep the floor dry. Clean up any water that spills on the floor as soon as it  happens.  Remove soap buildup in the tub or shower regularly.  Attach bath mats securely with double-sided non-slip rug tape.  Do not have throw rugs and other things on the floor that can make you trip. What can I do in the bedroom?  Use night lights.  Make sure that you have a light by your bed that is easy to reach.  Do not use any sheets or blankets that are too big for your bed. They should not hang down onto the floor.  Have a firm chair that has side arms. You can use this for support while you get dressed.  Do not have throw rugs and other things on the floor that can make you trip. What can I do in the kitchen?  Clean up any spills right away.  Avoid walking on wet floors.  Keep items that you use a lot in easy-to-reach places.  If you need to reach something above you, use a strong step stool that has a grab bar.  Keep electrical cords out of the way.  Do not use floor polish or wax that makes floors slippery. If you must use wax, use non-skid floor wax.  Do  not have throw rugs and other things on the floor that can make you trip. What can I do with my stairs?  Do not leave any items on the stairs.  Make sure that there are handrails on both sides of the stairs and use them. Fix handrails that are broken or loose. Make sure that handrails are as long as the stairways.  Check any carpeting to make sure that it is firmly attached to the stairs. Fix any carpet that is loose or worn.  Avoid having throw rugs at the top or bottom of the stairs. If you do have throw rugs, attach them to the floor with carpet tape.  Make sure that you have a light switch at the top of the stairs and the bottom of the stairs. If you do not have them, ask someone to add them for you. What else can I do to help prevent falls?  Wear shoes that:  Do not have high heels.  Have rubber bottoms.  Are comfortable and fit you well.  Are closed at the toe. Do not wear sandals.  If you  use a stepladder:  Make sure that it is fully opened. Do not climb a closed stepladder.  Make sure that both sides of the stepladder are locked into place.  Ask someone to hold it for you, if possible.  Clearly mark and make sure that you can see:  Any grab bars or handrails.  First and last steps.  Where the edge of each step is.  Use tools that help you move around (mobility aids) if they are needed. These include:  Canes.  Walkers.  Scooters.  Crutches.  Turn on the lights when you go into a dark area. Replace any light bulbs as soon as they burn out.  Set up your furniture so you have a clear path. Avoid moving your furniture around.  If any of your floors are uneven, fix them.  If there are any pets around you, be aware of where they are.  Review your medicines with your doctor. Some medicines can make you feel dizzy. This can increase your chance of falling. Ask your doctor what other things that you can do to help prevent falls. This information is not intended to replace advice given to you by your health care provider. Make sure you discuss any questions you have with your health care provider. Document Released: 03/16/2009 Document Revised: 10/26/2015 Document Reviewed: 06/24/2014 Elsevier Interactive Patient Education  2017 [employer].

## 2020-02-25 LAB — CBC WITH DIFFERENTIAL/PLATELET
Basophils Absolute: 0 10*3/uL (ref 0.0–0.2)
Basos: 1 %
EOS (ABSOLUTE): 0.1 10*3/uL (ref 0.0–0.4)
Eos: 1 %
Hematocrit: 42.8 % (ref 37.5–51.0)
Hemoglobin: 14.1 g/dL (ref 13.0–17.7)
Immature Grans (Abs): 0 10*3/uL (ref 0.0–0.1)
Immature Granulocytes: 0 %
Lymphocytes Absolute: 2.3 10*3/uL (ref 0.7–3.1)
Lymphs: 44 %
MCH: 30.5 pg (ref 26.6–33.0)
MCHC: 32.9 g/dL (ref 31.5–35.7)
MCV: 93 fL (ref 79–97)
Monocytes Absolute: 0.5 10*3/uL (ref 0.1–0.9)
Monocytes: 9 %
Neutrophils Absolute: 2.3 10*3/uL (ref 1.4–7.0)
Neutrophils: 45 %
Platelets: 211 10*3/uL (ref 150–450)
RBC: 4.62 x10E6/uL (ref 4.14–5.80)
RDW: 12.9 % (ref 11.6–15.4)
WBC: 5.2 10*3/uL (ref 3.4–10.8)

## 2020-02-25 LAB — COMPREHENSIVE METABOLIC PANEL
ALT: 37 IU/L (ref 0–44)
AST: 36 IU/L (ref 0–40)
Albumin/Globulin Ratio: 1.1 — ABNORMAL LOW (ref 1.2–2.2)
Albumin: 3.9 g/dL (ref 3.6–4.6)
Alkaline Phosphatase: 110 IU/L (ref 44–121)
BUN/Creatinine Ratio: 20 (ref 10–24)
BUN: 20 mg/dL (ref 8–27)
Bilirubin Total: 0.5 mg/dL (ref 0.0–1.2)
CO2: 23 mmol/L (ref 20–29)
Calcium: 9.7 mg/dL (ref 8.6–10.2)
Chloride: 103 mmol/L (ref 96–106)
Creatinine, Ser: 0.98 mg/dL (ref 0.76–1.27)
GFR calc Af Amer: 83 mL/min/{1.73_m2} (ref >59)
GFR calc non Af Amer: 72 mL/min/{1.73_m2} (ref >59)
Globulin, Total: 3.4 g/dL (ref 1.5–4.5)
Glucose: 135 mg/dL — ABNORMAL HIGH (ref 65–99)
Potassium: 4.4 mmol/L (ref 3.5–5.2)
Sodium: 138 mmol/L (ref 134–144)
Total Protein: 7.3 g/dL (ref 6.0–8.5)

## 2020-02-25 LAB — HEMOGLOBIN A1C
Est. average glucose Bld gHb Est-mCnc: 137 mg/dL
Hgb A1c MFr Bld: 6.4 % — ABNORMAL HIGH (ref 4.8–5.6)

## 2020-02-25 LAB — TSH: TSH: 0.728 u[IU]/mL (ref 0.450–4.500)

## 2020-02-25 LAB — LIPID PANEL
Chol/HDL Ratio: 2.9 ratio (ref 0.0–5.0)
Cholesterol, Total: 182 mg/dL (ref 100–199)
HDL: 63 mg/dL (ref >39)
LDL Chol Calc (NIH): 108 mg/dL — ABNORMAL HIGH (ref 0–99)
Triglycerides: 60 mg/dL (ref 0–149)
VLDL Cholesterol Cal: 11 mg/dL (ref 5–40)

## 2020-03-15 DIAGNOSIS — H25041 Posterior subcapsular polar age-related cataract, right eye: Secondary | ICD-10-CM | POA: Diagnosis not present

## 2020-03-15 DIAGNOSIS — H2513 Age-related nuclear cataract, bilateral: Secondary | ICD-10-CM | POA: Diagnosis not present

## 2020-03-15 DIAGNOSIS — H25013 Cortical age-related cataract, bilateral: Secondary | ICD-10-CM | POA: Diagnosis not present

## 2020-03-23 DIAGNOSIS — M21371 Foot drop, right foot: Secondary | ICD-10-CM | POA: Diagnosis not present

## 2020-03-23 DIAGNOSIS — E1142 Type 2 diabetes mellitus with diabetic polyneuropathy: Secondary | ICD-10-CM | POA: Diagnosis not present

## 2020-03-23 DIAGNOSIS — R2681 Unsteadiness on feet: Secondary | ICD-10-CM | POA: Diagnosis not present

## 2020-03-24 ENCOUNTER — Other Ambulatory Visit: Payer: Self-pay | Admitting: Family Medicine

## 2020-04-10 DIAGNOSIS — H2513 Age-related nuclear cataract, bilateral: Secondary | ICD-10-CM | POA: Diagnosis not present

## 2020-04-10 DIAGNOSIS — H52213 Irregular astigmatism, bilateral: Secondary | ICD-10-CM | POA: Diagnosis not present

## 2020-04-10 DIAGNOSIS — H25041 Posterior subcapsular polar age-related cataract, right eye: Secondary | ICD-10-CM | POA: Diagnosis not present

## 2020-04-10 DIAGNOSIS — H2511 Age-related nuclear cataract, right eye: Secondary | ICD-10-CM | POA: Diagnosis not present

## 2020-04-10 DIAGNOSIS — H35033 Hypertensive retinopathy, bilateral: Secondary | ICD-10-CM | POA: Diagnosis not present

## 2020-04-10 DIAGNOSIS — H25013 Cortical age-related cataract, bilateral: Secondary | ICD-10-CM | POA: Diagnosis not present

## 2020-04-10 LAB — HM DIABETES EYE EXAM

## 2020-04-25 DIAGNOSIS — H25811 Combined forms of age-related cataract, right eye: Secondary | ICD-10-CM | POA: Diagnosis not present

## 2020-04-25 DIAGNOSIS — H25041 Posterior subcapsular polar age-related cataract, right eye: Secondary | ICD-10-CM | POA: Diagnosis not present

## 2020-04-25 DIAGNOSIS — H2511 Age-related nuclear cataract, right eye: Secondary | ICD-10-CM | POA: Diagnosis not present

## 2020-05-02 DIAGNOSIS — H2511 Age-related nuclear cataract, right eye: Secondary | ICD-10-CM | POA: Diagnosis not present

## 2020-05-13 ENCOUNTER — Emergency Department: Payer: Medicare HMO

## 2020-05-13 ENCOUNTER — Encounter: Payer: Self-pay | Admitting: Emergency Medicine

## 2020-05-13 ENCOUNTER — Encounter
Admission: EM | Disposition: A | Discharge status: Home Health | Payer: Self-pay | Source: Home / Self Care | Attending: Internal Medicine

## 2020-05-13 ENCOUNTER — Inpatient Hospital Stay: Payer: Medicare HMO

## 2020-05-13 ENCOUNTER — Other Ambulatory Visit: Payer: Self-pay

## 2020-05-13 ENCOUNTER — Inpatient Hospital Stay
Admission: EM | Admit: 2020-05-13 | Discharge: 2020-05-17 | DRG: 853 | Disposition: A | Discharge status: Home Health | Payer: Medicare HMO | Attending: Internal Medicine | Admitting: Internal Medicine

## 2020-05-13 ENCOUNTER — Inpatient Hospital Stay: Payer: Medicare HMO | Admitting: Anesthesiology

## 2020-05-13 DIAGNOSIS — R3915 Urgency of urination: Secondary | ICD-10-CM | POA: Diagnosis present

## 2020-05-13 DIAGNOSIS — E1140 Type 2 diabetes mellitus with diabetic neuropathy, unspecified: Secondary | ICD-10-CM | POA: Diagnosis present

## 2020-05-13 DIAGNOSIS — Z7982 Long term (current) use of aspirin: Secondary | ICD-10-CM | POA: Diagnosis not present

## 2020-05-13 DIAGNOSIS — A419 Sepsis, unspecified organism: Secondary | ICD-10-CM

## 2020-05-13 DIAGNOSIS — Z950 Presence of cardiac pacemaker: Secondary | ICD-10-CM | POA: Diagnosis not present

## 2020-05-13 DIAGNOSIS — A4159 Other Gram-negative sepsis: Principal | ICD-10-CM

## 2020-05-13 DIAGNOSIS — Z4682 Encounter for fitting and adjustment of non-vascular catheter: Secondary | ICD-10-CM | POA: Diagnosis not present

## 2020-05-13 DIAGNOSIS — N136 Pyonephrosis: Secondary | ICD-10-CM | POA: Diagnosis not present

## 2020-05-13 DIAGNOSIS — E0590 Thyrotoxicosis, unspecified without thyrotoxic crisis or storm: Secondary | ICD-10-CM | POA: Diagnosis present

## 2020-05-13 DIAGNOSIS — I129 Hypertensive chronic kidney disease with stage 1 through stage 4 chronic kidney disease, or unspecified chronic kidney disease: Secondary | ICD-10-CM | POA: Diagnosis present

## 2020-05-13 DIAGNOSIS — N200 Calculus of kidney: Secondary | ICD-10-CM

## 2020-05-13 DIAGNOSIS — N132 Hydronephrosis with renal and ureteral calculous obstruction: Secondary | ICD-10-CM | POA: Diagnosis not present

## 2020-05-13 DIAGNOSIS — E1122 Type 2 diabetes mellitus with diabetic chronic kidney disease: Secondary | ICD-10-CM | POA: Diagnosis present

## 2020-05-13 DIAGNOSIS — I2510 Atherosclerotic heart disease of native coronary artery without angina pectoris: Secondary | ICD-10-CM | POA: Diagnosis present

## 2020-05-13 DIAGNOSIS — R6520 Severe sepsis without septic shock: Secondary | ICD-10-CM

## 2020-05-13 DIAGNOSIS — I428 Other cardiomyopathies: Secondary | ICD-10-CM | POA: Diagnosis not present

## 2020-05-13 DIAGNOSIS — N1330 Unspecified hydronephrosis: Secondary | ICD-10-CM | POA: Diagnosis not present

## 2020-05-13 DIAGNOSIS — N401 Enlarged prostate with lower urinary tract symptoms: Secondary | ICD-10-CM | POA: Diagnosis not present

## 2020-05-13 DIAGNOSIS — Z79899 Other long term (current) drug therapy: Secondary | ICD-10-CM

## 2020-05-13 DIAGNOSIS — D6959 Other secondary thrombocytopenia: Secondary | ICD-10-CM | POA: Diagnosis present

## 2020-05-13 DIAGNOSIS — Z4659 Encounter for fitting and adjustment of other gastrointestinal appliance and device: Secondary | ICD-10-CM

## 2020-05-13 DIAGNOSIS — E785 Hyperlipidemia, unspecified: Secondary | ICD-10-CM | POA: Diagnosis present

## 2020-05-13 DIAGNOSIS — Z20822 Contact with and (suspected) exposure to covid-19: Secondary | ICD-10-CM | POA: Diagnosis present

## 2020-05-13 DIAGNOSIS — N201 Calculus of ureter: Secondary | ICD-10-CM | POA: Diagnosis not present

## 2020-05-13 DIAGNOSIS — J95821 Acute postprocedural respiratory failure: Secondary | ICD-10-CM | POA: Diagnosis not present

## 2020-05-13 DIAGNOSIS — D696 Thrombocytopenia, unspecified: Secondary | ICD-10-CM | POA: Diagnosis not present

## 2020-05-13 DIAGNOSIS — N182 Chronic kidney disease, stage 2 (mild): Secondary | ICD-10-CM | POA: Diagnosis present

## 2020-05-13 DIAGNOSIS — G629 Polyneuropathy, unspecified: Secondary | ICD-10-CM

## 2020-05-13 DIAGNOSIS — E039 Hypothyroidism, unspecified: Secondary | ICD-10-CM | POA: Diagnosis present

## 2020-05-13 DIAGNOSIS — Z79890 Hormone replacement therapy (postmenopausal): Secondary | ICD-10-CM | POA: Diagnosis not present

## 2020-05-13 DIAGNOSIS — E1159 Type 2 diabetes mellitus with other circulatory complications: Secondary | ICD-10-CM | POA: Diagnosis present

## 2020-05-13 DIAGNOSIS — Z01818 Encounter for other preprocedural examination: Secondary | ICD-10-CM

## 2020-05-13 DIAGNOSIS — N049 Nephrotic syndrome with unspecified morphologic changes: Secondary | ICD-10-CM | POA: Diagnosis not present

## 2020-05-13 DIAGNOSIS — J9601 Acute respiratory failure with hypoxia: Secondary | ICD-10-CM | POA: Diagnosis not present

## 2020-05-13 DIAGNOSIS — I442 Atrioventricular block, complete: Secondary | ICD-10-CM | POA: Diagnosis present

## 2020-05-13 DIAGNOSIS — E1169 Type 2 diabetes mellitus with other specified complication: Secondary | ICD-10-CM | POA: Diagnosis present

## 2020-05-13 DIAGNOSIS — I10 Essential (primary) hypertension: Secondary | ICD-10-CM | POA: Diagnosis not present

## 2020-05-13 DIAGNOSIS — N35912 Unspecified bulbous urethral stricture, male: Secondary | ICD-10-CM | POA: Diagnosis present

## 2020-05-13 DIAGNOSIS — R6521 Severe sepsis with septic shock: Secondary | ICD-10-CM | POA: Diagnosis not present

## 2020-05-13 DIAGNOSIS — R053 Chronic cough: Secondary | ICD-10-CM | POA: Diagnosis not present

## 2020-05-13 DIAGNOSIS — N400 Enlarged prostate without lower urinary tract symptoms: Secondary | ICD-10-CM | POA: Diagnosis not present

## 2020-05-13 DIAGNOSIS — N179 Acute kidney failure, unspecified: Secondary | ICD-10-CM | POA: Diagnosis not present

## 2020-05-13 DIAGNOSIS — I517 Cardiomegaly: Secondary | ICD-10-CM | POA: Diagnosis not present

## 2020-05-13 DIAGNOSIS — R059 Cough, unspecified: Secondary | ICD-10-CM | POA: Diagnosis not present

## 2020-05-13 DIAGNOSIS — N390 Urinary tract infection, site not specified: Secondary | ICD-10-CM | POA: Diagnosis not present

## 2020-05-13 DIAGNOSIS — K8020 Calculus of gallbladder without cholecystitis without obstruction: Secondary | ICD-10-CM | POA: Diagnosis not present

## 2020-05-13 DIAGNOSIS — N138 Other obstructive and reflux uropathy: Secondary | ICD-10-CM | POA: Diagnosis not present

## 2020-05-13 DIAGNOSIS — R7881 Bacteremia: Secondary | ICD-10-CM | POA: Diagnosis not present

## 2020-05-13 HISTORY — PX: CYSTOSCOPY WITH STENT PLACEMENT: SHX5790

## 2020-05-13 HISTORY — DX: Sepsis, unspecified organism: A41.9

## 2020-05-13 LAB — CBC
HCT: 43.6 % (ref 39.0–52.0)
Hemoglobin: 14.6 g/dL (ref 13.0–17.0)
MCH: 32.5 pg (ref 26.0–34.0)
MCHC: 33.5 g/dL (ref 30.0–36.0)
MCV: 97.1 fL (ref 80.0–100.0)
Platelets: 144 10*3/uL — ABNORMAL LOW (ref 150–400)
RBC: 4.49 MIL/uL (ref 4.22–5.81)
RDW: 14.7 % (ref 11.5–15.5)
WBC: 14.9 10*3/uL — ABNORMAL HIGH (ref 4.0–10.5)
nRBC: 0 % (ref 0.0–0.2)

## 2020-05-13 LAB — COMPREHENSIVE METABOLIC PANEL
ALT: 37 U/L (ref 0–44)
AST: 56 U/L — ABNORMAL HIGH (ref 15–41)
Albumin: 3.8 g/dL (ref 3.5–5.0)
Alkaline Phosphatase: 73 U/L (ref 38–126)
Anion gap: 13 (ref 5–15)
BUN: 35 mg/dL — ABNORMAL HIGH (ref 8–23)
CO2: 22 mmol/L (ref 22–32)
Calcium: 9.8 mg/dL (ref 8.9–10.3)
Chloride: 101 mmol/L (ref 98–111)
Creatinine, Ser: 2.22 mg/dL — ABNORMAL HIGH (ref 0.61–1.24)
GFR, Estimated: 29 mL/min — ABNORMAL LOW (ref >60)
Glucose, Bld: 124 mg/dL — ABNORMAL HIGH (ref 70–99)
Potassium: 3.6 mmol/L (ref 3.5–5.1)
Sodium: 136 mmol/L (ref 135–145)
Total Bilirubin: 1.4 mg/dL — ABNORMAL HIGH (ref 0.3–1.2)
Total Protein: 7.8 g/dL (ref 6.5–8.1)

## 2020-05-13 LAB — URINALYSIS, COMPLETE (UACMP) WITH MICROSCOPIC
Bilirubin Urine: NEGATIVE
Glucose, UA: NEGATIVE mg/dL
Ketones, ur: NEGATIVE mg/dL
Nitrite: NEGATIVE
Protein, ur: 30 mg/dL — AB
Specific Gravity, Urine: 1.018 (ref 1.005–1.030)
WBC, UA: >50 WBC/hpf — ABNORMAL HIGH (ref 0–5)
pH: 5 (ref 5.0–8.0)

## 2020-05-13 LAB — LIPASE, BLOOD: Lipase: 33 U/L (ref 11–51)

## 2020-05-13 LAB — LACTIC ACID, PLASMA: Lactic Acid, Venous: 4 mmol/L (ref 0.5–1.9)

## 2020-05-13 LAB — RESP PANEL BY RT-PCR (FLU A&B, COVID) ARPGX2
Influenza A by PCR: NEGATIVE
Influenza B by PCR: NEGATIVE
SARS Coronavirus 2 by RT PCR: NEGATIVE

## 2020-05-13 SURGERY — CYSTOSCOPY, WITH STENT INSERTION
Anesthesia: General | Laterality: Right

## 2020-05-13 MED ORDER — EPHEDRINE SULFATE 50 MG/ML IJ SOLN
INTRAMUSCULAR | Status: DC | PRN
  Administered 2020-05-13: 10 mg via INTRAVENOUS
  Administered 2020-05-13: 5 mg via INTRAVENOUS
  Administered 2020-05-13: 15 mg via INTRAVENOUS
  Administered 2020-05-13: 10 mg via INTRAVENOUS

## 2020-05-13 MED ORDER — FINASTERIDE 5 MG PO TABS
5.0000 mg | ORAL_TABLET | Freq: Every day | ORAL | Status: DC
  Administered 2020-05-14 – 2020-05-16 (×3): 5 mg via ORAL
  Filled 2020-05-13 (×4): qty 1

## 2020-05-13 MED ORDER — SIMVASTATIN 20 MG PO TABS: 40.0000 mg | ORAL_TABLET | Freq: Every day | ORAL | Status: DC

## 2020-05-13 MED ORDER — SUGAMMADEX SODIUM 500 MG/5ML IV SOLN
INTRAVENOUS | Status: DC | PRN
  Administered 2020-05-13: 200 mg via INTRAVENOUS

## 2020-05-13 MED ORDER — SODIUM CHLORIDE 0.9 % IV SOLN
1.0000 g | Freq: Once | INTRAVENOUS | Status: AC
  Administered 2020-05-13: 20:00:00 1 g via INTRAVENOUS
  Filled 2020-05-13: qty 10

## 2020-05-13 MED ORDER — SODIUM CHLORIDE FLUSH 0.9 % IV SOLN
INTRAVENOUS | Status: AC
  Filled 2020-05-13: qty 10

## 2020-05-13 MED ORDER — LACTATED RINGERS IV BOLUS
1000.0000 mL | Freq: Once | INTRAVENOUS | Status: AC
  Administered 2020-05-13: 20:00:00 1000 mL via INTRAVENOUS

## 2020-05-13 MED ORDER — LACTATED RINGERS IV BOLUS (SEPSIS): 250.0000 mL | Freq: Once | INTRAVENOUS | Status: DC

## 2020-05-13 MED ORDER — DEXAMETHASONE SODIUM PHOSPHATE 10 MG/ML IJ SOLN
INTRAMUSCULAR | Status: DC | PRN
  Administered 2020-05-13: 10 mg via INTRAVENOUS

## 2020-05-13 MED ORDER — FENTANYL CITRATE (PF) 100 MCG/2ML IJ SOLN
25.0000 ug | INTRAMUSCULAR | Status: DC | PRN
  Administered 2020-05-13: 25 ug via INTRAVENOUS

## 2020-05-13 MED ORDER — ENOXAPARIN SODIUM 30 MG/0.3ML ~~LOC~~ SOLN
30.0000 mg | SUBCUTANEOUS | Status: DC
  Administered 2020-05-14: 21:00:00 30 mg via SUBCUTANEOUS
  Filled 2020-05-13 (×2): qty 0.3

## 2020-05-13 MED ORDER — PROPOFOL 10 MG/ML IV BOLUS
INTRAVENOUS | Status: DC | PRN
  Administered 2020-05-13: 30 mg via INTRAVENOUS
  Administered 2020-05-13: 100 mg via INTRAVENOUS

## 2020-05-13 MED ORDER — POLYETHYLENE GLYCOL 3350 17 G PO PACK
17.0000 g | PACK | Freq: Every day | ORAL | Status: DC
  Administered 2020-05-14: 08:00:00 17 g
  Filled 2020-05-13 (×3): qty 1

## 2020-05-13 MED ORDER — LIDOCAINE HCL (CARDIAC) PF 100 MG/5ML IV SOSY
PREFILLED_SYRINGE | INTRAVENOUS | Status: DC | PRN
  Administered 2020-05-13: 100 mg via INTRAVENOUS

## 2020-05-13 MED ORDER — ASPIRIN EC 81 MG PO TBEC
81.0000 mg | DELAYED_RELEASE_TABLET | Freq: Every day | ORAL | Status: DC
Start: 2020-05-14 — End: 2020-05-14
  Administered 2020-05-14: 08:00:00 81 mg via ORAL
  Filled 2020-05-13: qty 1

## 2020-05-13 MED ORDER — PROPOFOL 500 MG/50ML IV EMUL
INTRAVENOUS | Status: DC | PRN
  Administered 2020-05-13: 25 ug/kg/min via INTRAVENOUS

## 2020-05-13 MED ORDER — EPHEDRINE 5 MG/ML INJ
INTRAVENOUS | Status: AC
  Filled 2020-05-13: qty 10

## 2020-05-13 MED ORDER — DOCUSATE SODIUM 100 MG PO CAPS: 100.0000 mg | ORAL_CAPSULE | Freq: Two times a day (BID) | ORAL | Status: DC

## 2020-05-13 MED ORDER — VASOPRESSIN 20 UNIT/ML IV SOLN
INTRAVENOUS | Status: AC
  Filled 2020-05-13: qty 1

## 2020-05-13 MED ORDER — SUGAMMADEX SODIUM 500 MG/5ML IV SOLN
INTRAVENOUS | Status: AC
  Filled 2020-05-13: qty 5

## 2020-05-13 MED ORDER — SODIUM CHLORIDE 0.9 % IR SOLN
Status: DC | PRN
  Administered 2020-05-13: 3000 mL

## 2020-05-13 MED ORDER — FENTANYL CITRATE (PF) 100 MCG/2ML IJ SOLN
25.0000 ug | INTRAMUSCULAR | Status: DC | PRN
  Administered 2020-05-14: 100 ug via INTRAVENOUS
  Filled 2020-05-13: qty 2

## 2020-05-13 MED ORDER — ROCURONIUM BROMIDE 100 MG/10ML IV SOLN
INTRAVENOUS | Status: DC | PRN
  Administered 2020-05-13: 10 mg via INTRAVENOUS
  Administered 2020-05-13: 30 mg via INTRAVENOUS
  Administered 2020-05-13: 40 mg via INTRAVENOUS

## 2020-05-13 MED ORDER — LACTATED RINGERS IV BOLUS (SEPSIS)
1000.0000 mL | Freq: Once | INTRAVENOUS | Status: AC
  Administered 2020-05-13: 21:00:00 1000 mL via INTRAVENOUS

## 2020-05-13 MED ORDER — SODIUM CHLORIDE 0.9 % IV SOLN
250.0000 mL | INTRAVENOUS | Status: DC
  Administered 2020-05-16: 11:00:00 250 mL via INTRAVENOUS

## 2020-05-13 MED ORDER — LACTATED RINGERS IV BOLUS (SEPSIS)
1000.0000 mL | Freq: Once | INTRAVENOUS | Status: AC
  Administered 2020-05-13: 22:00:00 1000 mL via INTRAVENOUS

## 2020-05-13 MED ORDER — FENTANYL CITRATE (PF) 100 MCG/2ML IJ SOLN
INTRAMUSCULAR | Status: AC
  Filled 2020-05-13: qty 2

## 2020-05-13 MED ORDER — OXYCODONE-ACETAMINOPHEN 5-325 MG PO TABS
1.0000 | ORAL_TABLET | Freq: Once | ORAL | Status: AC
  Administered 2020-05-13: 20:00:00 1 via ORAL
  Filled 2020-05-13: qty 1

## 2020-05-13 MED ORDER — GABAPENTIN 300 MG PO CAPS: 300.0000 mg | ORAL_CAPSULE | Freq: Every day | ORAL | Status: DC

## 2020-05-13 MED ORDER — ONDANSETRON HCL 4 MG/2ML IJ SOLN: 4.0000 mg | Freq: Once | INTRAMUSCULAR | Status: DC | PRN

## 2020-05-13 MED ORDER — GLYCOPYRROLATE 0.2 MG/ML IJ SOLN
INTRAMUSCULAR | Status: AC
  Filled 2020-05-13: qty 1

## 2020-05-13 MED ORDER — ONDANSETRON HCL 4 MG/2ML IJ SOLN
INTRAMUSCULAR | Status: DC | PRN
  Administered 2020-05-13: 4 mg via INTRAVENOUS

## 2020-05-13 MED ORDER — CHLORHEXIDINE GLUCONATE 0.12% ORAL RINSE (MEDLINE KIT)
15.0000 mL | Freq: Two times a day (BID) | OROMUCOSAL | Status: DC
  Administered 2020-05-14: 08:00:00 15 mL via OROMUCOSAL

## 2020-05-13 MED ORDER — SODIUM CHLORIDE 0.9 % IV SOLN: INTRAVENOUS | Status: DC

## 2020-05-13 MED ORDER — VASOPRESSIN 20 UNIT/ML IV SOLN
INTRAVENOUS | Status: DC | PRN
  Administered 2020-05-13 (×2): 2 [IU] via INTRAVENOUS

## 2020-05-13 MED ORDER — SODIUM CHLORIDE 0.9 % IV SOLN
INTRAVENOUS | Status: DC | PRN
  Administered 2020-05-13: 22:00:00 50 ug/min via INTRAVENOUS

## 2020-05-13 MED ORDER — PROPOFOL 1000 MG/100ML IV EMUL
0.0000 ug/kg/min | INTRAVENOUS | Status: DC
  Administered 2020-05-14: 01:00:00 25 ug/kg/min via INTRAVENOUS
  Administered 2020-05-14: 06:00:00 35 ug/kg/min via INTRAVENOUS
  Filled 2020-05-13 (×2): qty 100

## 2020-05-13 MED ORDER — SUCCINYLCHOLINE CHLORIDE 200 MG/10ML IV SOSY
PREFILLED_SYRINGE | INTRAVENOUS | Status: AC
  Filled 2020-05-13: qty 10

## 2020-05-13 MED ORDER — LIDOCAINE HCL (PF) 2 % IJ SOLN
INTRAMUSCULAR | Status: AC
  Filled 2020-05-13: qty 5

## 2020-05-13 MED ORDER — ENOXAPARIN SODIUM 40 MG/0.4ML ~~LOC~~ SOLN: 40.0000 mg | SUBCUTANEOUS | Status: DC

## 2020-05-13 MED ORDER — SUCCINYLCHOLINE CHLORIDE 20 MG/ML IJ SOLN
INTRAMUSCULAR | Status: DC | PRN
  Administered 2020-05-13: 100 mg via INTRAVENOUS

## 2020-05-13 MED ORDER — FENTANYL CITRATE (PF) 100 MCG/2ML IJ SOLN: 25.0000 ug | INTRAMUSCULAR | Status: DC | PRN

## 2020-05-13 MED ORDER — HYDRALAZINE HCL 20 MG/ML IJ SOLN: 5.0000 mg | Freq: Three times a day (TID) | INTRAMUSCULAR | Status: DC | PRN

## 2020-05-13 MED ORDER — NOREPINEPHRINE 4 MG/250ML-% IV SOLN
0.0000 ug/min | INTRAVENOUS | Status: DC
  Administered 2020-05-14: 01:00:00 6 ug/min via INTRAVENOUS
  Administered 2020-05-14: 3 ug/min via INTRAVENOUS
  Administered 2020-05-14: 01:00:00 5 ug/min via INTRAVENOUS
  Administered 2020-05-14: 1 ug/min via INTRAVENOUS
  Administered 2020-05-14: 10:00:00 3 ug/min via INTRAVENOUS
  Administered 2020-05-14: 4 ug/min via INTRAVENOUS
  Filled 2020-05-13 (×2): qty 250

## 2020-05-13 MED ORDER — FENTANYL CITRATE (PF) 100 MCG/2ML IJ SOLN
INTRAMUSCULAR | Status: DC | PRN
  Administered 2020-05-13: 50 ug via INTRAVENOUS

## 2020-05-13 MED ORDER — LEVOTHYROXINE SODIUM 50 MCG PO TABS: 175.0000 ug | ORAL_TABLET | Freq: Every day | ORAL | Status: DC

## 2020-05-13 MED ORDER — ONDANSETRON HCL 4 MG/2ML IJ SOLN
INTRAMUSCULAR | Status: AC
  Filled 2020-05-13: qty 4

## 2020-05-13 MED ORDER — SODIUM CHLORIDE 0.9 % IV SOLN: 1.0000 g | INTRAVENOUS | Status: DC

## 2020-05-13 MED ORDER — DOCUSATE SODIUM 50 MG/5ML PO LIQD
100.0000 mg | Freq: Two times a day (BID) | ORAL | Status: DC
  Administered 2020-05-14 (×2): 100 mg
  Filled 2020-05-13 (×4): qty 10

## 2020-05-13 MED ORDER — GLYCOPYRROLATE 0.2 MG/ML IJ SOLN
INTRAMUSCULAR | Status: DC | PRN
  Administered 2020-05-13: .2 mg via INTRAVENOUS

## 2020-05-13 MED ORDER — ACETAMINOPHEN 650 MG RE SUPP: 650.0000 mg | Freq: Four times a day (QID) | RECTAL | Status: DC | PRN

## 2020-05-13 MED ORDER — ORAL CARE MOUTH RINSE
15.0000 mL | OROMUCOSAL | Status: DC
  Administered 2020-05-14 (×6): 15 mL via OROMUCOSAL

## 2020-05-13 MED ORDER — DEXAMETHASONE SODIUM PHOSPHATE 10 MG/ML IJ SOLN
INTRAMUSCULAR | Status: AC
  Filled 2020-05-13: qty 1

## 2020-05-13 MED ORDER — ONDANSETRON HCL 4 MG PO TABS: 4.0000 mg | ORAL_TABLET | Freq: Four times a day (QID) | ORAL | Status: DC | PRN

## 2020-05-13 MED ORDER — PHENYLEPHRINE HCL (PRESSORS) 10 MG/ML IV SOLN
INTRAVENOUS | Status: DC | PRN
  Administered 2020-05-13 (×4): 200 ug via INTRAVENOUS
  Administered 2020-05-13 (×2): 100 ug via INTRAVENOUS
  Administered 2020-05-13: 400 ug via INTRAVENOUS

## 2020-05-13 MED ORDER — LACTATED RINGERS IV SOLN: INTRAVENOUS | Status: DC | PRN

## 2020-05-13 MED ORDER — TAMSULOSIN HCL 0.4 MG PO CAPS
0.4000 mg | ORAL_CAPSULE | Freq: Every day | ORAL | Status: DC
  Administered 2020-05-14 – 2020-05-17 (×4): 0.4 mg via ORAL
  Filled 2020-05-13 (×4): qty 1

## 2020-05-13 MED ORDER — ONDANSETRON HCL 4 MG/2ML IJ SOLN: 4.0000 mg | Freq: Four times a day (QID) | INTRAMUSCULAR | Status: DC | PRN

## 2020-05-13 MED ORDER — PHENYLEPHRINE HCL (PRESSORS) 10 MG/ML IV SOLN
INTRAVENOUS | Status: AC
  Filled 2020-05-13: qty 1

## 2020-05-13 MED ORDER — PROPOFOL 500 MG/50ML IV EMUL
INTRAVENOUS | Status: AC
  Filled 2020-05-13: qty 50

## 2020-05-13 MED ORDER — ROCURONIUM BROMIDE 10 MG/ML (PF) SYRINGE
PREFILLED_SYRINGE | INTRAVENOUS | Status: AC
  Filled 2020-05-13: qty 10

## 2020-05-13 MED ORDER — ACETAMINOPHEN 325 MG PO TABS: 650.0000 mg | ORAL_TABLET | Freq: Four times a day (QID) | ORAL | Status: DC | PRN

## 2020-05-13 SURGICAL SUPPLY — 24 items
BAG DRAIN CYSTO-URO LG1000N (MISCELLANEOUS) ×3 IMPLANT
BAG URINE DRAIN 2000ML AR STRL (UROLOGICAL SUPPLIES) ×3 IMPLANT
BRUSH SCRUB EZ 1% IODOPHOR (MISCELLANEOUS) IMPLANT
CATH URETL 5X70 OPEN END (CATHETERS) ×3 IMPLANT
CONRAY 43 FOR UROLOGY 50M (MISCELLANEOUS) ×3 IMPLANT
GLOVE BIOGEL PI IND STRL 7.5 (GLOVE) ×1 IMPLANT
GLOVE BIOGEL PI INDICATOR 7.5 (GLOVE) ×2
GOWN STRL REUS W/ TWL LRG LVL3 (GOWN DISPOSABLE) ×1 IMPLANT
GOWN STRL REUS W/ TWL XL LVL3 (GOWN DISPOSABLE) ×1 IMPLANT
GOWN STRL REUS W/TWL LRG LVL3 (GOWN DISPOSABLE) ×2
GOWN STRL REUS W/TWL XL LVL3 (GOWN DISPOSABLE) ×2
GUIDEWIRE STR DUAL SENSOR (WIRE) ×3 IMPLANT
KIT TURNOVER CYSTO (KITS) ×3 IMPLANT
MANIFOLD NEPTUNE II (INSTRUMENTS) IMPLANT
PACK CYSTO AR (MISCELLANEOUS) ×3 IMPLANT
SET CYSTO W/LG BORE CLAMP LF (SET/KITS/TRAYS/PACK) ×3 IMPLANT
SOL .9 NS 3000ML IRR  AL (IV SOLUTION) ×2
SOL .9 NS 3000ML IRR UROMATIC (IV SOLUTION) ×1 IMPLANT
STENT URET 6FRX24 CONTOUR (STENTS) IMPLANT
STENT URET 6FRX26 CONTOUR (STENTS) ×3 IMPLANT
SURGILUBE 2OZ TUBE FLIPTOP (MISCELLANEOUS) ×3 IMPLANT
SYR TOOMEY IRRIG 70ML (MISCELLANEOUS)
SYRINGE TOOMEY IRRIG 70ML (MISCELLANEOUS) IMPLANT
WATER STERILE IRR 1000ML POUR (IV SOLUTION) ×3 IMPLANT

## 2020-05-13 NOTE — Consult Note (Signed)
NAME:  Todd Ortega, MRN:  [id number], DOB:  Apr 05, 1935, LOS: 0 ADMISSION DATE:  05/13/2020, CONSULTATION DATE:  05/13/20 REFERRING MD:  Dr. Diamantina Providence, CHIEF COMPLAINT:  Abdominal pain  Brief History   85 year old male presenting with septic shock of urinary tract origin secondary to right distal ureteral stone and obstructive uropathy status post stent placement with postoperative respiratory failure requiring mechanical ventilation and vasopressors. History of present illness   85 year old male with past medical history of kidney stones presented to the ED with complaints of generalized abdominal pain that also radiates to the right flank with associated chills.  He reported having generalized body aching and chills over the previous few days as well as a chronic cough for the last several months.  Initial vitals in ED: Febrile 101, heart rate 90 , RR 18 , BP 173/64 and SPO2 100% on room air . ED work-up included labs significant for: Creatinine 2.22 elevated from baseline 0.90, lactic 4, WBC 14.9, UA: Leukocytes large, nitrates negative and bacteria many. CT abdomen showed an obstructing 5 x 7 mm stone at the right ureterovesicular junction with moderate hydronephrosis and prominent hydroureter.  Additional nonobstructing stones in both kidneys.  Patient was taken urgently to the OR for emergent right ureteral stent placement.  Patient was intubated and mechanically ventilated for the procedure.  Postoperatively the patient was tachypneic with a respiratory rate in the 40s and they were unable to extubate in PACU as originally intended.  Patient is also hypotensive requiring vasopressor support.  PCCM consulted for further monitoring and management and admission to the ICU.  Past Medical History  OSA CAD T2DM BPH CKD Hypothyroidism Hyperlipidemia Hypertension Complete heart block s/p permanent pacemaker Arthritis Nephrolithiasis  Significant Hospital Events   05/13/20 emergently taken to  the OR for right ureteral stent placement, postop admitted to the ICU intubated requiring mechanical ventilation and vasopressors  Consults:  Urology  Procedures:  05/13/20 cystoscopy, laser lithotripsy of right distal ureteral stone with right ureteral stent placement 05/13/20 ETT >>  Significant Diagnostic Tests:  05/13/20 CT abdomen >> obstructing 5 x 7 mm stone at the right ureterovesicular junction with moderate hydronephrosis and prominent hydroureter, moderate right perinephric edema.  Additional nonobstructing stones in both kidneys.  Colonic diverticulosis, cholelithiasis without gallbladder inflammation, and enlarged prostate gland.  Micro Data:  05/13/20 COVID-19>> negative 05/13/20 influenza A/B >> negative 05/13/20 urine culture >> 05/13/20 blood culture ID panel >> + Enterobacterales, + Klebsiella oxytoca 05/13/20 blood cultures x 2 >> 05/14/20 MRSA PCR>> negative  Antimicrobials:  05/13/20 ceftriaxone >>  Interim history/subjective:  Patient intubated and sedated status post right ureteral stent placement requiring vasopressors for septic shock.  Labs/ Imaging personally reviewed Na+/ K+: 136/ 3.6 BUN/Cr.: 35/ 2.22 Hgb: 14.6  WBC/ TMAX: 14.9/ 38.3 Lactic: 4.0  ABG: 7.35/ 38/ 50/ 21 CXR 05/13/20: No acute abnormality chronic interstitial coarsening  Objective   Blood pressure (!) 122/52, pulse 72, temperature 99.1 F (37.3 C), temperature source Oral, resp. rate 18, height 5\' 6"  (1.676 m), weight 72.6 kg, SpO2 96 %.       No intake or output data in the 24 hours ending 05/13/20 2335 Filed Weights   05/13/20 1530  Weight: 72.6 kg    Examination: General: Adult male, critically ill, lying in bed intubated & sedated requiring mechanical ventilation  HEENT: MM pink/moist, anicteric, atraumatic, neck supple, slightly diaphoretic Neuro: sedated, unable to follow commands, PERRL +3 CV: s1s2 paced rhythm on monitor, no r/m/g Pulm: Regular, non labored on  PRVC 50%/ PEEP 5, breath sounds coarse-BUL & diminished-BLL GI: soft, rounded, bs x 4 GU: foley in place with hematuria- no clots noted Skin: no rashes/lesions noted Extremities: warm/dry, pulses + 2 R/P, no edema noted  Resolved Hospital Problem list     Assessment & Plan:  Severe sepsis with septic shock secondary to obstructing right ureteral stone Status post right ureteral cystoscopy, laser lithotripsy and right ureteral stent placement.  Aggressively IVF resuscitated in the ED- 3 L LR bolus.  Lactic was 4 on arrival.  Patient requiring vasopressor support in PACU. - Supplemental oxygen as needed, to maintain SpO2 > 90% - f/u cultures, trend lactic - Daily CBC - monitor WBC/ fever curve - IV antibiotics: Ceftriaxone - Continue vasopressors: Levophed & phenylephrine to maintain MAP< 65 - Strict I/O's: alert provider if UOP < 0.5 mL/kg/hr  Acute postoperative hypoxic respiratory failure  Patient unable to be weaned off the ventilator in PACU status post stent placement.  Patient reportedly tachypneic, with respiration rate in the 40s and diaphoretic - Ventilator settings: PRVC  8 mL/kg, 50% FiO2, 5 PEEP, continue ventilator support & lung protective strategies - Wean PEEP & FiO2 as tolerated, maintain SpO2 > 90% - Head of bed elevated 30 degrees, VAP protocol in place - Plateau pressures less than 30 cm H20  - Intermittent chest x-ray & ABG PRN - Daily WUA with SBT as tolerated  - Ensure adequate pulmonary hygiene  - F/u cultures - PAD protocol in place: continue Fentanyl IVP & Propofol drip  Acute kidney injury secondary to obstructing right ureteral stone Kidney stones BPH CT abdomen shows obstructing kidney stone to the right UVJ with moderate hydronephrosis and hydroureter status post lithotripsy and right ureter stent placement- 12/11.  Baseline creatinine: 0.9, current creatinine: 2.22 -Continue coud catheter, monitor hematuria for clots - Strict I/O's: alert provider  if UOP < 0.5 mL/kg/hr - Daily BMP, replace electrolytes PRN - Avoid nephrotoxic agents as able, ensure adequate renal perfusion -urology following, appreciate input -Continue ceftriaxone as above -Continue Flomax and Proscar home regimen for BPH  Hypertension -Home regimen on hold in the setting of hypotension due to septic shock  Hypothyroidism -Continue Synthroid daily  Type 2 diabetes mellitus -Every 4 CBG monitoring -Patient n.p.o. currently -Initiate SSI as needed -Follow ICU hypo/hyperglycemia protocol  Best practice (evaluated daily)  Diet: N.p.o. Pain/Anxiety/Delirium protocol (if indicated): Fentanyl IVP & propofol drip VAP protocol (if indicated): Initiated DVT prophylaxis: Enoxaparin sq GI prophylaxis: Protonix IV Glucose control: Monitor every 4, Target range 140-180 Mobility: Bedrest, mobilize as tolerated last date of multidisciplinary goals of care discussion: 05/13/20 Family and staff present:Wife and son updated by urology- Dr. Diamantina Providence Summary of discussion: Leave patient intubated with ventilator support status post OR procedure, treating sepsis with ceftriaxone and continue vasopressor support for septic shock Follow up goals of care discussion due: 05/14/20 Code Status: Full Disposition: ICU  Labs   CBC: Recent Labs  Lab 05/13/20 1536  WBC 14.9*  HGB 14.6  HCT 43.6  MCV 97.1  PLT 144*    Basic Metabolic Panel: Recent Labs  Lab 05/13/20 1536  NA 136  K 3.6  CL 101  CO2 22  GLUCOSE 124*  BUN 35*  CREATININE 2.22*  CALCIUM 9.8   GFR: Estimated Creatinine Clearance: 22.8 mL/min (A) (by C-G formula based on SCr of 2.22 mg/dL (H)). Recent Labs  Lab 05/13/20 1536 05/13/20 1957  WBC 14.9*  --   LATICACIDVEN  --  4.0*    Liver  Function Tests: Recent Labs  Lab 05/13/20 1536  AST 56*  ALT 37  ALKPHOS 73  BILITOT 1.4*  PROT 7.8  ALBUMIN 3.8   Recent Labs  Lab 05/13/20 1536  LIPASE 33   No results for input(s): AMMONIA in the  last 168 hours.  ABG No results found for: PHART, PCO2ART, PO2ART, HCO3, TCO2, ACIDBASEDEF, O2SAT   Coagulation Profile: No results for input(s): INR, PROTIME in the last 168 hours.  Cardiac Enzymes: No results for input(s): CKTOTAL, CKMB, CKMBINDEX, TROPONINI in the last 168 hours.  HbA1C: Hemoglobin A1C  Date/Time Value Ref Range Status  08/16/2019 02:03 PM 6.3 (A) 4.0 - 5.6 % Final   Hgb A1c MFr Bld  Date/Time Value Ref Range Status  02/24/2020 09:56 AM 6.4 (H) 4.8 - 5.6 % Final    Comment:             Prediabetes: 5.7 - 6.4          Diabetes: >6.4          Glycemic control for adults with diabetes: <7.0   02/16/2019 03:10 PM 6.0 (H) 4.8 - 5.6 % Final    Comment:             Prediabetes: 5.7 - 6.4          Diabetes: >6.4          Glycemic control for adults with diabetes: <7.0     CBG: No results for input(s): GLUCAP in the last 168 hours.  Review of Systems:   UTA-patient intubated and sedated unable to provide history  Past Medical History  He,  has a past medical history of Arthritis, BPH (benign prostatic hypertrophy), Chronic kidney disease, Complete heart block ([place]), Coronary artery disease, Diabetes mellitus without complication ([place]), Foot drop, right foot, Hyperlipidemia, Hypertension, Hyperthyroidism, Nephrolithiasis, Neuropathy, Shortness of breath dyspnea, and Sleep apnea.   Surgical History    Past Surgical History:  Procedure Laterality Date   CARDIAC CATHETERIZATION     HERNIA REPAIR Bilateral    Inguinal Hernia Repair   MASS EXCISION     removed from left hand   PACEMAKER INSERTION N/A 04/20/2015   Procedure: INSERTION PACEMAKER;  Surgeon: Isaias Cowman, MD;  Location: [facility] ORS;  Service: Cardiovascular;  Laterality: N/A;     Social History   reports that he has never smoked. He has never used smokeless tobacco. He reports that he does not drink alcohol and does not use drugs.   Family History   His family history includes  Diabetes in his mother; Drug abuse in his brother; Gout in his mother; Heart attack in his mother; Hyperlipidemia in his mother; Hypertension in his mother; Lung cancer in his father.   Allergies Allergies  Allergen Reactions   Venlafaxine     Other reaction(s): Other (See Comments) Sweating, stomach upset, constipation     Home Medications  Prior to Admission medications   Medication Sig Start Date End Date Taking? Authorizing Provider  acetaminophen (TYLENOL) 325 MG tablet Take 650 mg by mouth every 6 (six) hours as needed.    [provider]  aspirin 81 MG tablet Take 81 mg by mouth daily.  08/05/12   [provider]  Calcium Carb-Cholecalciferol (CALCIUM 500+D3 PO) Take 1 tablet by mouth daily.    [provider]  Cholecalciferol (VITAMIN D3) 2000 UNITS capsule Take 2,000 Units by mouth daily.     [provider]  cyanocobalamin 100 MCG tablet Take 100 mcg by mouth  daily.    [provider]  finasteride (PROSCAR) 5 MG tablet Take 1 tablet (5 mg total) by mouth at bedtime. 10/11/19   [name]., MD  gabapentin (NEURONTIN) 300 MG capsule Take 1 capsule (300 mg total) by mouth daily. 03/04/19   [name]., MD  glucose blood test strip BAYER CONTOUR TEST (In Vitro Strip)  1 (one) Strip Strip check sugars once daily, or as needed for 0 days  Quantity: 300;  Refills: 4   Ordered :31-Mar-2013  [name] ;  Started 31-Mar-2013 Active Comments: Medication taken as needed. Patient not taking: Reported on 02/24/2020 03/31/13   [provider]  levothyroxine (SYNTHROID, LEVOTHROID) 175 MCG tablet Take 1 tablet (175 mcg total) by mouth daily before breakfast. Patient not taking: Reported on 08/16/2019 04/21/15   Callwood, Karma Greaser D, MD  lisinopril (PRINIVIL,ZESTRIL) 10 MG tablet Take 10 mg by mouth daily.  11/23/16   [provider]  loratadine (CLARITIN) 10 MG tablet Take 1 tablet (10 mg total) by mouth daily.  08/16/19   [name]., MD  methimazole (TAPAZOLE) 5 MG tablet Take 2.5 mg by mouth daily.  08/05/12   [provider]  Multiple Vitamin (MULTIVITAMIN) capsule Take 1 capsule by mouth daily.    [provider]  simvastatin (ZOCOR) 40 MG tablet TAKE 1 TABLET EVERY DAY 01/20/20   [name]., MD  tamsulosin (FLOMAX) 0.4 MG CAPS capsule TAKE 1 CAPSULE (0.4 MG TOTAL) BY MOUTH DAILY AFTER BREAKFAST. 03/24/20   [name]., MD     Critical care time: 45 minutes       Venetia Night, AGACNP-BC Acute Care Nurse Practitioner [facility]   [phone] / [phone] Please see Amion for pager details.

## 2020-05-13 NOTE — ED Triage Notes (Signed)
Pt to ER with c/o lower abdominal that started last night.  Pt states subsided some last night, but returned this AM.  Pt denies n/v/d, states last Bm yesterday.

## 2020-05-13 NOTE — Progress Notes (Signed)
CODE SEPSIS - PHARMACY COMMUNICATION  **Broad Spectrum Antibiotics should be administered within 1 hour of Sepsis diagnosis**  Time Code Sepsis Called/Page Received: 2000  Antibiotics Ordered: Ceftriaxone  Time of 1st antibiotic administration: N/a  Additional action taken by pharmacy:  -- Messaged Charlestine Night, @ 2030 who informed me that she had two septic patients and on intubation patient.  -- Called ED and was told pt went to the OR.   If necessary, Name of Provider/Nurse Contacted: Marcy Salvo, RN and ED Secretary     Rowland Lathe ,PharmD Clinical Pharmacist  05/13/2020  8:23 PM

## 2020-05-13 NOTE — Progress Notes (Signed)
Pt being followed for sepsis protocol

## 2020-05-13 NOTE — Anesthesia Procedure Notes (Signed)
Procedure Name: Intubation Performed by: Kelton Pillar, CRNA Pre-anesthesia Checklist: Patient identified, Emergency Drugs available, Suction available and Patient being monitored Patient Re-evaluated:Patient Re-evaluated prior to induction Oxygen Delivery Method: Circle system utilized Preoxygenation: Pre-oxygenation with 100% oxygen Induction Type: Rapid sequence and IV induction Ventilation: Mask ventilation without difficulty Laryngoscope Size: McGraph and 3 Grade View: Grade I Tube type: Oral Tube size: 7.0 mm Number of attempts: 1 Airway Equipment and Method: Stylet and Oral airway Placement Confirmation: ETT inserted through vocal cords under direct vision,  positive ETCO2,  breath sounds checked- equal and bilateral and CO2 detector Secured at: 21 cm Tube secured with: Tape Dental Injury: Teeth and Oropharynx as per pre-operative assessment

## 2020-05-13 NOTE — ED Provider Notes (Signed)
[facility] Emergency Department Provider Note  ____________________________________________   Event Date/Time   First MD Initiated Contact with Patient 05/13/20 1755     (approximate)  I have reviewed the triage vital signs and the nursing notes.   HISTORY  Chief Complaint Abdominal Pain   HPI Todd Ortega is a 85 y.o. male with past medical history of CAD, CKD, BPH, arthritis, HTN, HDL, kidney stones, OSA and peripheral neuropathy who presents for assessment of some bilateral lower abdominal pain that began last night.  Patient states that he stopped but then returned again this morning.  States he had a normal bowel movement yesterday and has not had any diarrhea.  He had some nausea last night but did not vomit.  Also endorses some chills but denies any measured fevers prior to arrival.  He also states he has had on and off nonproductive cough over the last several months.  He denies any headache, earache, sore throat, chest pain, shortness of breath, back pain, rash or extremity pain.  No recent falls or injuries.  Denies any EtOH or illicit drugs         Past Medical History:  Diagnosis Date  . Arthritis   . BPH (benign prostatic hypertrophy)   . Chronic kidney disease   . Complete heart block ([place])   . Coronary artery disease   . Diabetes mellitus without complication ([place])   . Foot drop, right foot   . Hyperlipidemia   . Hypertension   . Hyperthyroidism   . Nephrolithiasis   . Neuropathy   . Shortness of breath dyspnea   . Sleep apnea     Patient Active Problem List   Diagnosis Date Noted  . Severe sepsis ([place]) 05/13/2020  . Type 2 diabetes mellitus with diabetic neuropathy, unspecified ([place]) 02/15/2018  . Foot drop, right 02/15/2018  . CHB (complete heart block) ([place]) 04/20/2015  . Benign fibroma of prostate 10/17/2014  . Arteriosclerosis of coronary artery 10/17/2014  . Essential (primary) hypertension 10/17/2014  . Enthesopathy  10/17/2014  . HLD (hyperlipidemia) 10/17/2014  . Hyperthyroidism 10/17/2014  . 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 Neuropathy 10/17/2014  . Apnea, sleep 10/17/2014  . Diabetes mellitus, type 2 ([place]) 10/17/2014  . Benign neoplasm of colon 10/19/2013  . BP (high blood pressure) 10/19/2013  . Benign prostatic hyperplasia with urinary obstruction 04/06/2013  . Calculus of kidney 04/06/2013  . Abnormal prostate specific antigen 04/06/2013  . Hematuria, microscopic 04/06/2013  . Incomplete bladder emptying 04/06/2013    Past Surgical History:  Procedure Laterality Date  . CARDIAC CATHETERIZATION    . HERNIA REPAIR Bilateral    Inguinal Hernia Repair  . MASS EXCISION     removed from left hand  . PACEMAKER INSERTION N/A 04/20/2015   Procedure: INSERTION PACEMAKER;  Surgeon: Isaias Cowman, MD;  Location: [facility] ORS;  Service: Cardiovascular;  Laterality: N/A;    Prior to Admission medications   Medication Sig Start Date End Date Taking? Authorizing Provider  acetaminophen (TYLENOL) 325 MG tablet Take 650 mg by mouth every 6 (six) hours as needed.    [provider]  aspirin 81 MG tablet Take 81 mg by mouth daily.  08/05/12   [provider]  Calcium Carb-Cholecalciferol (CALCIUM 500+D3 PO) Take 1 tablet by mouth daily.    [provider]  Cholecalciferol (VITAMIN D3) 2000 UNITS capsule Take 2,000 Units by mouth daily.     [provider]  cyanocobalamin 100 MCG  tablet Take 100 mcg by mouth daily.    [provider]  finasteride (PROSCAR) 5 MG tablet Take 1 tablet (5 mg total) by mouth at bedtime. 10/11/19   [name]., MD  gabapentin (NEURONTIN) 300 MG capsule Take 1 capsule (300 mg total) by mouth daily. 03/04/19   [name]., MD  glucose blood test strip BAYER CONTOUR TEST (In Vitro Strip)  1 (one) Strip Strip check sugars once daily, or as needed for 0 days  Quantity: 300;  Refills: 4    Ordered :31-Mar-2013  [name] ;  Started 31-Mar-2013 Active Comments: Medication taken as needed. Patient not taking: Reported on 02/24/2020 03/31/13   [provider]  levothyroxine (SYNTHROID, LEVOTHROID) 175 MCG tablet Take 1 tablet (175 mcg total) by mouth daily before breakfast. Patient not taking: Reported on 08/16/2019 04/21/15   Callwood, Karma Greaser D, MD  lisinopril (PRINIVIL,ZESTRIL) 10 MG tablet Take 10 mg by mouth daily.  11/23/16   [provider]  loratadine (CLARITIN) 10 MG tablet Take 1 tablet (10 mg total) by mouth daily. 08/16/19   [name]., MD  methimazole (TAPAZOLE) 5 MG tablet Take 2.5 mg by mouth daily.  08/05/12   [provider]  Multiple Vitamin (MULTIVITAMIN) capsule Take 1 capsule by mouth daily.    [provider]  simvastatin (ZOCOR) 40 MG tablet TAKE 1 TABLET EVERY DAY 01/20/20   [name]., MD  tamsulosin (FLOMAX) 0.4 MG CAPS capsule TAKE 1 CAPSULE (0.4 MG TOTAL) BY MOUTH DAILY AFTER BREAKFAST. 03/24/20   [name]., MD    Allergies Venlafaxine  Family History  Problem Relation Age of Onset  . Heart attack Mother   . Gout Mother   . Diabetes Mother   . Hypertension Mother   . Hyperlipidemia Mother   . Lung cancer Father   . Drug abuse Brother     Social History Social History   Tobacco Use  . Smoking status: Never Smoker  . Smokeless tobacco: Never Used  Vaping Use  . Vaping Use: Never used  Substance Use Topics  . Alcohol use: No    Alcohol/week: 0.0 standard drinks  . Drug use: No    Review of Systems  Review of Systems  Constitutional: Positive for chills and malaise/fatigue. Negative for fever.  HENT: Negative for sore throat.   Eyes: Negative for pain.  Respiratory: Positive for cough (chronic). Negative for stridor.   Cardiovascular: Negative for chest pain.  Gastrointestinal: Positive for abdominal pain and nausea. Negative for vomiting.  Genitourinary: Negative  for dysuria and frequency.  Musculoskeletal: Negative for myalgias.  Skin: Negative for rash.  Neurological: Negative for seizures, loss of consciousness and headaches.  Psychiatric/Behavioral: Negative for suicidal ideas.  All other systems reviewed and are negative.     ____________________________________________   PHYSICAL EXAM:  VITAL SIGNS: ED Triage Vitals  Enc Vitals Group     BP 05/13/20 1529 (!) 123/58     Pulse Rate 05/13/20 1529 96     Resp 05/13/20 1529 18     Temp 05/13/20 1529 100.3 F (37.9 C)     Temp Source 05/13/20 1529 Oral     SpO2 05/13/20 1529 99 %     Weight 05/13/20 1530 160 lb (72.6 kg)     Height 05/13/20 1530 5\' 6"  (1.676 m)     Head Circumference --      Peak Flow --      Pain Score  05/13/20 1530 2     Pain Loc --      Pain Edu? --      Excl. in [place]? --    Vitals:   05/13/20 1529 05/13/20 1920  BP: (!) 123/58 (!) 173/64  Pulse: 96 90  Resp: 18 18  Temp: 100.3 F (37.9 C) (!) 101 F (38.3 C)  SpO2: 99% 100%   Physical Exam Vitals and nursing note reviewed.  Constitutional:      Appearance: He is well-developed and well-nourished.  HENT:     Head: Normocephalic and atraumatic.     Right Ear: External ear normal.     Left Ear: External ear normal.     Mouth/Throat:     Mouth: Mucous membranes are dry.  Eyes:     Conjunctiva/sclera: Conjunctivae normal.  Cardiovascular:     Rate and Rhythm: Normal rate and regular rhythm.     Heart sounds: No murmur heard.   Pulmonary:     Effort: Pulmonary effort is normal. No respiratory distress.     Breath sounds: Normal breath sounds.  Abdominal:     Palpations: Abdomen is soft.     Tenderness: There is abdominal tenderness in the suprapubic area and left lower quadrant. There is no right CVA tenderness or left CVA tenderness.  Musculoskeletal:        General: No edema.     Cervical back: Neck supple.  Skin:    General: Skin is warm and dry.     Capillary Refill: Capillary refill  takes 2 to 3 seconds.  Neurological:     Mental Status: He is alert and oriented to person, place, and time.  Psychiatric:        Mood and Affect: Mood and affect and mood normal.      ____________________________________________   LABS (all labs ordered are listed, but only abnormal results are displayed)  Labs Reviewed  COMPREHENSIVE METABOLIC PANEL - Abnormal; Notable for the following components:      Result Value   Glucose, Bld 124 (*)    BUN 35 (*)    Creatinine, Ser 2.22 (*)    AST 56 (*)    Total Bilirubin 1.4 (*)    GFR, Estimated 29 (*)    All other components within normal limits  CBC - Abnormal; Notable for the following components:   WBC 14.9 (*)    Platelets 144 (*)    All other components within normal limits  URINALYSIS, COMPLETE (UACMP) WITH MICROSCOPIC - Abnormal; Notable for the following components:   Color, Urine YELLOW (*)    APPearance CLOUDY (*)    Hgb urine dipstick LARGE (*)    Protein, ur 30 (*)    Leukocytes,Ua LARGE (*)    WBC, UA >50 (*)    Bacteria, UA MANY (*)    All other components within normal limits  LACTIC ACID, PLASMA - Abnormal; Notable for the following components:   Lactic Acid, Venous 4.0 (*)    All other components within normal limits  RESP PANEL BY RT-PCR (FLU A&B, COVID) [id number]  URINE CULTURE  CULTURE, BLOOD (ROUTINE X 2)  CULTURE, BLOOD (ROUTINE X 2)  LIPASE, BLOOD  LACTIC ACID, PLASMA  PROTIME-INR  CORTISOL-AM, BLOOD  PROCALCITONIN  COMPREHENSIVE METABOLIC PANEL  CBC  TSH   ____________________________________________   ____________________________________________  RADIOLOGY  ED MD interpretation: Right-sided perinephric stranding and kidney stone. It is at the right enterovesicular junction with some hydro-. No other clear evidence of acute intra-abdominal pathology.  Chest x-ray shows evidence of acute pneumonia, pneumothorax, edema or large effusion. No other acute thoracic process.  Official radiology  report(s): CT ABDOMEN PELVIS WO CONTRAST  Result Date: 05/13/2020 CLINICAL DATA:  Acute nonlocalized abdominal pain. Patient reports lower abdominal pain onset last night. EXAM: CT ABDOMEN AND PELVIS WITHOUT CONTRAST TECHNIQUE: Multidetector CT imaging of the abdomen and pelvis was performed following the standard protocol without IV contrast. COMPARISON:  Remote abdominal CT 03/10/2008 FINDINGS: Lower chest: Normal heart size with pacemaker wires partially included. No acute airspace disease or pleural effusion. Hepatobiliary: Borderline hepatic steatosis. No evidence of focal hepatic lesion on noncontrast exam. Small calcified gallstones within physiologically distended gallbladder. No pericholecystic inflammation. No common bile duct dilatation. Pancreas: No ductal dilatation or inflammation. Spleen: Normal in size without focal abnormality. Adrenals/Urinary Tract: Mild left adrenal thickening. No dominant nodule of either adrenal gland. Obstructing 5 x 7 mm stone at the right ureterovesicular junction with moderate hydronephrosis and prominent hydroureter. Moderate right perinephric edema. Additional nonobstructing stones in the lower right kidney, largest measures 6 mm. There is no left hydronephrosis. Small nonobstructing stones in the left kidney largest in the lower pole measuring 4 mm. There is no left ureteral calculus. Urinary bladder is partially distended. Stomach/Bowel: Small hiatal hernia. Small amount of fluid in the distal esophagus. The stomach is physiologically distended. There are small duodenal diverticulum without acute inflammation. Normal positioning of the duodenum and ligament of Treitz. Diverticulum at the junction of the duodenum and jejunum without surrounding fat stranding. Fluid-filled small bowel that are nondilated or inflamed. Diminutive normal appendix. Multifocal colonic diverticulosis. Moderate volume of colonic stool. Sigmoid colon is tortuous and courses into the upper  abdomen. No diverticulitis or colonic inflammation. Vascular/Lymphatic: Aorto bi-iliac atherosclerosis and tortuosity. No aortic aneurysm. There is no bulky abdominopelvic adenopathy. Reproductive: Enlarged prostate gland spans 5.4 cm and causes mass effect on the bladder base. Other: Fat in both inguinal canals. Minimal free fluid in the right pericolic gutter which is likely reactive. No free air. Tiny fat containing umbilical hernia Musculoskeletal: Multilevel degenerative change in the spine with grade 1 anterolisthesis of L3 on L4 and L4 on L5, likely facet mediated. Degenerative change of both hips. There are no acute or suspicious osseous abnormalities. IMPRESSION: 1. Obstructing 5 x 7 mm stone at the right ureterovesicular junction with moderate hydronephrosis and prominent hydroureter. Moderate right perinephric edema. 2. Additional nonobstructing stones in both kidneys. 3. Colonic diverticulosis without diverticulitis. 4. Cholelithiasis without gallbladder inflammation. 5. Enlarged prostate gland causing mass effect on the bladder base. Aortic Atherosclerosis (ICD10-I70.0). Electronically Signed   By: Keith Rake M.D.   On: 05/13/2020 18:50   DG Chest 1 View  Result Date: 05/13/2020 CLINICAL DATA:  Chronic cough peer EXAM: CHEST  1 VIEW COMPARISON:  Radiograph 04/20/2015. Included lung bases from abdominal CT earlier today. 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 Improved cardiomegaly from 2016. Unchanged mediastinal contours with aortic atherosclerosis. Chronic interstitial coarsening. No confluent consolidation. No pleural fluid or pneumothorax. No acute osseous abnormalities are seen. Degenerative change in the left shoulder. IMPRESSION: 1. No acute abnormality. 2. Chronic interstitial coarsening. Electronically Signed   By: Keith Rake M.D.   On: 05/13/2020 18:54    ____________________________________________   PROCEDURES  Procedure(s) performed (including Critical  Care):  .Critical Care Performed by: Lucrezia Starch, MD Authorized by: Lucrezia Starch, MD   Critical care provider statement:    Critical care time (minutes):  45   Critical care time was  exclusive of:  Separately billable procedures and treating other patients   Critical care was necessary to treat or prevent imminent or life-threatening deterioration of the following conditions:  Sepsis   Critical care was time spent personally by me on the following activities:  Discussions with consultants, evaluation of patient's response to treatment, examination of patient, ordering and performing treatments and interventions, ordering and review of laboratory studies, ordering and review of radiographic studies, pulse oximetry, re-evaluation of patient's condition, obtaining history from patient or surrogate and review of old charts     ____________________________________________   INITIAL IMPRESSION / [place] / ED COURSE      Patient presents with [place] to history exam for assessment of some intermittent bilateral more worse on the right abdominal pain over the last 24 hours. Associate with some nausea but patient has not had any vomiting diarrhea or urinary symptoms. He does endorse some chills. On arrival he has elevate temperature 100.3 with otherwise stable vital signs on room air.  Differential includes but is not limited to pyelonephritis, appendicitis, cholecystitis, cystitis, prostatitis, and diverticulitis. No evidence of pneumonia on chest x-ray and have a low suspicion that his chronic intermittent cough is related to acute infectious process at this time. Lipase of 33 is not consistent with acute pancreatitis. CMP remarkable for creatinine of 2.22 which represents an AKI as his creatinine of 0.98 was noted 2 months ago. No significant electrolyte or metabolic derangements noted today. CBC with leukocytosis with WBC count of 14.9.  UA appears grossly infected.  CT obtained  shows evidence of a stone and given findings of elevated white blood cell count and fever concerning for sepsis or infected kidney stone.  I did consult on-call urologist Dr. Diamantina Providence he stated he would likely put a stent to the patient later this evening but that I should go ahead and plan for hospital admission.  Patient given broad-spectrum antibiotics and to IV fluid boluses after blood and urine cultures were obtained.  We will plan to admit to hospital service for further evaluation management of sepsis from infected right kidney stone.       ____________________________________________   FINAL CLINICAL IMPRESSION(S) / ED DIAGNOSES  Final diagnoses:  AKI (acute kidney injury) ([place])  Kidney stone  Sepsis, due to unspecified organism, unspecified whether acute organ dysfunction present ([place])    Medications  aspirin tablet 81 mg (has no administration in time range)  simvastatin (ZOCOR) tablet 40 mg (has no administration in time range)  levothyroxine (SYNTHROID) tablet 175 mcg (has no administration in time range)  finasteride (PROSCAR) tablet 5 mg (has no administration in time range)  tamsulosin (FLOMAX) capsule 0.4 mg (has no administration in time range)  gabapentin (NEURONTIN) capsule 300 mg (has no administration in time range)  0.9 %  sodium chloride infusion (has no administration in time range)  cefTRIAXone (ROCEPHIN) 1 g in sodium chloride 0.9 % 100 mL IVPB (has no administration in time range)  acetaminophen (TYLENOL) tablet 650 mg (has no administration in time range)    Or  acetaminophen (TYLENOL) suppository 650 mg (has no administration in time range)  docusate sodium (COLACE) capsule 100 mg (has no administration in time range)  ondansetron (ZOFRAN) tablet 4 mg (has no administration in time range)    Or  ondansetron (ZOFRAN) injection 4 mg (has no administration in time range)  lactated ringers bolus 1,000 mL (has no administration in time range)    And   lactated ringers bolus 1,000 mL (  has no administration in time range)    And  lactated ringers bolus 250 mL (has no administration in time range)  enoxaparin (LOVENOX) injection 30 mg (has no administration in time range)  lactated ringers bolus 1,000 mL (0 mLs Intravenous Stopped 05/13/20 2042)  cefTRIAXone (ROCEPHIN) 1 g in sodium chloride 0.9 % 100 mL IVPB (0 g Intravenous Stopped 05/13/20 2011)  oxyCODONE-acetaminophen (PERCOCET/ROXICET) 5-325 MG per tablet 1 tablet (1 tablet Oral Given 05/13/20 1930)  lactated ringers bolus 1,000 mL (0 mLs Intravenous Stopped 05/13/20 2043)     ED Discharge Orders    None       Note:  This document was prepared using Dragon voice recognition software and may include unintentional dictation errors.   Lucrezia Starch, MD 05/13/20 [phone]

## 2020-05-13 NOTE — Progress Notes (Signed)
PHARMACIST - PHYSICIAN COMMUNICATION  CONCERNING:  Enoxaparin (Lovenox) for DVT Prophylaxis    RECOMMENDATION: Patient was prescribed enoxaprin 40mg  q24 hours for VTE prophylaxis.   Filed Weights   05/13/20 1530  Weight: 72.6 kg (160 lb)    Body mass index is 25.82 kg/m.  Estimated Creatinine Clearance: 22.8 mL/min (A) (by C-G formula based on SCr of 2.22 mg/dL (H)).     Patient is candidate for enoxaparin 30mg  every 24 hours based on CrCl <81ml/min or Weight <45kg  DESCRIPTION: Pharmacy has adjusted enoxaparin dose per [facility] policy.  Patient is now receiving enoxaparin 30 mg every 24 hours    Rowland Lathe, PharmD Clinical Pharmacist  05/13/2020 8:26 PM

## 2020-05-13 NOTE — Anesthesia Preprocedure Evaluation (Addendum)
Anesthesia Evaluation  Patient identified by MRN, date of birth, ID band Patient awake    Reviewed: Allergy & Precautions, NPO status , Patient's Chart, lab work & pertinent test results  History of Anesthesia Complications Negative for: history of anesthetic complications  Airway Mallampati: III       Dental   Pulmonary sleep apnea (off CPAP now) , neg COPD, Not current smoker,           Cardiovascular hypertension, + CAD  + dysrhythmias (complete heart block) + pacemaker      Neuro/Psych neg Seizures    GI/Hepatic Neg liver ROS, neg GERD  ,  Endo/Other  diabetes (borderline), Type 2Hyperthyroidism (pt states he is no longer on meds)   Renal/GU Renal disease (stones)     Musculoskeletal   Abdominal   Peds  Hematology   Anesthesia Other Findings   Reproductive/Obstetrics                            Anesthesia Physical Anesthesia Plan  ASA: III and emergent  Anesthesia Plan: General   Post-op Pain Management:    Induction: Intravenous  PONV Risk Score and Plan: 2 and Dexamethasone and Ondansetron  Airway Management Planned: Oral ETT  Additional Equipment:   Intra-op Plan:   Post-operative Plan: Possible Post-op intubation/ventilation  Informed Consent: I have reviewed the patients History and Physical, chart, labs and discussed the procedure including the risks, benefits and alternatives for the proposed anesthesia with the patient or authorized representative who has indicated his/her understanding and acceptance.       Plan Discussed with:   Anesthesia Plan Comments:        Anesthesia Quick Evaluation

## 2020-05-13 NOTE — ED Notes (Addendum)
Pt due to go to OR for emergent surgery. Pts clothing removed per protocol and placed into bag and handed to family at bedside to take home.

## 2020-05-13 NOTE — Consult Note (Signed)
Urology Consult   I have been asked to see the patient by Dr. Tamala Julian, for evaluation and management of right distal ureteral stone and sepsis from urinary source.  Chief Complaint: Right abdominal pain, chills  HPI:  Todd Ortega is a 85 y.o. year old male with past medical history notable for CAD and pacemaker, diabetes, nephrolithiasis who reports 1 week of right-sided lower abdominal and flank pain, as well as fever and chills today.  He has required ureteroscopy previously with Dr. Jacqlyn Larsen at [facility].  CT in the ED showed a 7 mm right distal ureteral stone with severe upstream hydroureteronephrosis.  Lab work notable for lactate of 4, leukocytosis to 14.9k, AKI with creatinine 2.22 from baseline of 0.90, and grossly infected urine with many bacteria, greater than 50 WBCs, WBC clumps, 20-50 RBCs, large leukocytes, nitrite negative.  Febrile in the ED to 101, blood pressure 122/52, SPO2 96, heart rate 72, respirations 18.  He received ceftriaxone in the ED.    PMH: Past Medical History:  Diagnosis Date  . Arthritis   . BPH (benign prostatic hypertrophy)   . Chronic kidney disease   . Complete heart block ([place])   . Coronary artery disease   . Diabetes mellitus without complication ([place])   . Foot drop, right foot   . Hyperlipidemia   . Hypertension   . Hyperthyroidism   . Nephrolithiasis   . Neuropathy   . Shortness of breath dyspnea   . Sleep apnea     Surgical History: Past Surgical History:  Procedure Laterality Date  . CARDIAC CATHETERIZATION    . HERNIA REPAIR Bilateral    Inguinal Hernia Repair  . MASS EXCISION     removed from left hand  . PACEMAKER INSERTION N/A 04/20/2015   Procedure: INSERTION PACEMAKER;  Surgeon: Isaias Cowman, MD;  Location: [facility] ORS;  Service: Cardiovascular;  Laterality: N/A;    Allergies:  Allergies  Allergen Reactions  . Venlafaxine     Other reaction(s): Other (See Comments) Sweating, stomach upset, constipation    Family  History: Family History  Problem Relation Age of Onset  . Heart attack Mother   . Gout Mother   . Diabetes Mother   . Hypertension Mother   . Hyperlipidemia Mother   . Lung cancer Father   . Drug abuse Brother     Social History:  reports that he has never smoked. He has never used smokeless tobacco. He reports that he does not drink alcohol and does not use drugs.  ROS: Negative aside from those stated in the HPI.  Physical Exam: BP (!) 122/52   Pulse 71   Temp (!) 101 F (38.3 C) (Oral)   Resp 18   Ht 5\' 6"  (1.676 m)   Wt 72.6 kg   SpO2 94%   BMI 25.82 kg/m    Constitutional:  Alert and oriented, No acute distress.  Ill-appearing Cardiovascular: Regular rate and rhythm Respiratory: Clear to auscultation bilaterally GI: Abdomen is soft, nontender, nondistended, no abdominal masses GU: Right CVA tenderness Lymph: No cervical or inguinal lymphadenopathy. Skin: No rashes, bruises or suspicious lesions. Neurologic: Grossly intact, no focal deficits, moving all 4 extremities. Psychiatric: Normal mood and affect.  Laboratory Data: Reviewed, see HPI  Pertinent Imaging: I have personally reviewed the CT today showing a 7 mm right distal ureteral stone with severe upstream hydroureteronephrosis.  There is a 6 mm right lower pole stone, and a left 4 mm lower pole stone.  Assessment & Plan:  85 year old male with 7 mm right distal ureteral stone and sepsis from urinary source with a lactate of 4, leukocytosis to 14 point 9K, and febrile to 101.  We discussed the need for drainage in the setting of an infected and obstructed system.  A ureteral stent is a small plastic tube that is placed cystoscopically with one end in the kidney and the other end in the bladder that allows the infection from the kidney to drain, and relieves pain from the obstructing stone.  We discussed the risks at length including bleeding, infection, sepsis, death, ureteral injury, and stent related  symptoms including urgency/frequency/dysuria/flank pain/gross hematuria.  There is a low, but not 0, risk of inability to pass the ureteral stent alongside the stone from below which would require percutaneous nephrostomy tube by interventional radiology.  In his case with his large distal stone and significant BPH with median lobe, the risk of percutaneous nephrostomy tube is higher.  Finally, we discussed possible prolonged hospitalization and recovery, possible temporary Foley catheter placement, and 10 to 14-day course of antibiotics.  We reviewed the need for a follow-up procedure for definitive management of their stone when the infection has been treated in 2 to 3 weeks with either ureteroscopy/laser lithotripsy.   Recommendations: OR tonight for emergent right ureteral stent placement Agree with admission to hospitalist for antibiotics and resuscitation  Billey Co, [place] [address] [place], [place] [address] [phone]

## 2020-05-13 NOTE — H&P (Addendum)
History and Physical    [name] SVX:[id number] DOB: Apr 02, 1935 DOA: 05/13/2020  PCP: [name]., MD   Patient coming from:  Home.  I have personally briefly reviewed patient's old medical records in [facility].  Chief Complaint:  HPI: Todd Ortega is a 85 y.o. male with medical history significant of CAD, history of complete heart block, status post pacemaker, BPH, arthritis, hypertension, hyperlipidemia, kidney stones, obstructive sleep apnea, peripheral neuropathy who presented in the emergency department with abdominal pain, associated with chills. Patient reported having generalized body aching associated with chills for last few days. He did not feel good today so he came to the emergency department. He reports having chronic cough for last several months. Patient reports abdominal pain is generalized radiating towards the right back. He denies any recent travel or sick contacts. He is vaccinated against Covid.  ED Course: He was found  febrile with a temp of 101, heart rate 90, respiratory rate 18,  blood pressure 173/ 64,  O2 saturation 100% on room air. Labs include sodium 136, potassium 3.6, chloride 101, bicarb 22, glucose 124, BUN 35, creatinine 2.22, calcium 9.8, anion gap 13, lipase 33, AST 56, ALT 37, total bilirubin 1.4, WBC 14.9, lactic acid 4.0, hemoglobin 14.6, hematocrit 43.6, platelet 144, UA: leukocytes large,  nitrites negative,  bacterias many. CT Abdomen: Obstructing 5 x 7 mm stone at the right ureterovesicular junction with moderate hydronephrosis and prominent hydroureter. Moderate right perinephric edema. Additional nonobstructing stones in both kidneys.Colonic diverticulosis without diverticulitis.Cholelithiasis without gallbladder inflammation. Enlarged prostate gland causing mass effect on the bladder base  Review of Systems: As per HPI otherwise 10 point review of systems negative.  Review of Systems  Constitutional: Positive for chills and  fever.  HENT: Negative.   Eyes: Negative.   Respiratory: Positive for cough.   Cardiovascular: Negative.   Gastrointestinal: Positive for nausea.  Genitourinary: Positive for flank pain, frequency and urgency.  Skin: Negative.   Neurological: Negative.   Endo/Heme/Allergies: Negative.   Psychiatric/Behavioral: Negative.     Past Medical History:  Diagnosis Date  . Arthritis   . BPH (benign prostatic hypertrophy)   . Chronic kidney disease   . Complete heart block ([place])   . Coronary artery disease   . Diabetes mellitus without complication ([place])   . Foot drop, right foot   . Hyperlipidemia   . Hypertension   . Hyperthyroidism   . Nephrolithiasis   . Neuropathy   . Shortness of breath dyspnea   . Sleep apnea     Past Surgical History:  Procedure Laterality Date  . CARDIAC CATHETERIZATION    . HERNIA REPAIR Bilateral    Inguinal Hernia Repair  . MASS EXCISION     removed from left hand  . PACEMAKER INSERTION N/A 04/20/2015   Procedure: INSERTION PACEMAKER;  Surgeon: Isaias Cowman, MD;  Location: [facility] ORS;  Service: Cardiovascular;  Laterality: N/A;     reports that he has never smoked. He has never used smokeless tobacco. He reports that he does not drink alcohol and does not use drugs.  Allergies  Allergen Reactions  . Venlafaxine     Other reaction(s): Other (See Comments) Sweating, stomach upset, constipation    Family History  Problem Relation Age of Onset  . Heart attack Mother   . Gout Mother   . Diabetes Mother   . Hypertension Mother   . Hyperlipidemia Mother   . Lung cancer Father   . Drug abuse Brother  Family history reviewed and not pertinent   Prior to Admission medications   Medication Sig Start Date End Date Taking? Authorizing Provider  acetaminophen (TYLENOL) 325 MG tablet Take 650 mg by mouth every 6 (six) hours as needed.    [provider]  aspirin 81 MG tablet Take 81 mg by mouth daily.  08/05/12   [provider]  Calcium Carb-Cholecalciferol (CALCIUM 500+D3 PO) Take 1 tablet by mouth daily.    [provider]  Cholecalciferol (VITAMIN D3) 2000 UNITS capsule Take 2,000 Units by mouth daily.     [provider]  cyanocobalamin 100 MCG tablet Take 100 mcg by mouth daily.    [provider]  finasteride (PROSCAR) 5 MG tablet Take 1 tablet (5 mg total) by mouth at bedtime. 10/11/19   [name]., MD  gabapentin (NEURONTIN) 300 MG capsule Take 1 capsule (300 mg total) by mouth daily. 03/04/19   [name]., MD  glucose blood test strip BAYER CONTOUR TEST (In Vitro Strip)  1 (one) Strip Strip check sugars once daily, or as needed for 0 days  Quantity: 300;  Refills: 4   Ordered :31-Mar-2013  [name] ;  Started 31-Mar-2013 Active Comments: Medication taken as needed. Patient not taking: Reported on 02/24/2020 03/31/13   [provider]  levothyroxine (SYNTHROID, LEVOTHROID) 175 MCG tablet Take 1 tablet (175 mcg total) by mouth daily before breakfast. Patient not taking: Reported on 08/16/2019 04/21/15   Callwood, Karma Greaser D, MD  lisinopril (PRINIVIL,ZESTRIL) 10 MG tablet Take 10 mg by mouth daily.  11/23/16   [provider]  loratadine (CLARITIN) 10 MG tablet Take 1 tablet (10 mg total) by mouth daily. 08/16/19   [name]., MD  methimazole (TAPAZOLE) 5 MG tablet Take 2.5 mg by mouth daily.  08/05/12   [provider]  Multiple Vitamin (MULTIVITAMIN) capsule Take 1 capsule by mouth daily.    [provider]  simvastatin (ZOCOR) 40 MG tablet TAKE 1 TABLET EVERY DAY 01/20/20   [name]., MD  tamsulosin (FLOMAX) 0.4 MG CAPS capsule TAKE 1 CAPSULE (0.4 MG TOTAL) BY MOUTH DAILY AFTER BREAKFAST. 03/24/20   [name]., MD    Physical Exam: Vitals:   05/13/20 1529 05/13/20 1530 05/13/20 1920  BP: (!) 123/58  (!) 173/64  Pulse: 96  90  Resp: 18  18  Temp: 100.3 F (37.9 C)  (!) 101 F  (38.3 C)  TempSrc: Oral  Oral  SpO2: 99%  100%  Weight:  72.6 kg   Height:  5\' 6"  (1.676 m)     Constitutional: NAD, calm, comfortable, having chills. Vitals:   05/13/20 1529 05/13/20 1530 05/13/20 1920  BP: (!) 123/58  (!) 173/64  Pulse: 96  90  Resp: 18  18  Temp: 100.3 F (37.9 C)  (!) 101 F (38.3 C)  TempSrc: Oral  Oral  SpO2: 99%  100%  Weight:  72.6 kg   Height:  5\' 6"  (1.676 m)    Eyes: PERRL, lids and conjunctivae normal ENMT: Mucous membranes are moist. Posterior pharynx clear of any exudate or lesions.Normal dentition.  Neck: normal, supple, no masses, no thyromegaly Respiratory: clear to auscultation bilaterally, no wheezing, no crackles. Normal respiratory effort. No accessory muscle use.  Cardiovascular: Regular rate and rhythm, no murmurs / rubs / gallops. No extremity edema. 2+ pedal pulses. No carotid bruits.  Abdomen:  Right flank tenderness ++ , no masses palpated. No  hepatosplenomegaly. Bowel sounds positive.  Musculoskeletal: no clubbing / cyanosis. No joint deformity upper and lower extremities. Good ROM, no contractures. Normal muscle tone.  Skin: no rashes, lesions, ulcers. No induration Neurologic: CN 2-12 grossly intact. Sensation intact, DTR normal. Strength 5/5 in all 4.  Psychiatric: Normal judgment and insight. Alert and oriented x 3. Normal mood.     Labs on Admission: I have personally reviewed following labs and imaging studies  CBC: Recent Labs  Lab 05/13/20 1536  WBC 14.9*  HGB 14.6  HCT 43.6  MCV 97.1  PLT 242*   Basic Metabolic Panel: Recent Labs  Lab 05/13/20 1536  NA 136  K 3.6  CL 101  CO2 22  GLUCOSE 124*  BUN 35*  CREATININE 2.22*  CALCIUM 9.8   GFR: Estimated Creatinine Clearance: 22.8 mL/min (A) (by C-G formula based on SCr of 2.22 mg/dL (H)). Liver Function Tests: Recent Labs  Lab 05/13/20 1536  AST 56*  ALT 37  ALKPHOS 73  BILITOT 1.4*  PROT 7.8  ALBUMIN 3.8   Recent Labs  Lab 05/13/20 1536   LIPASE 33   No results for input(s): AMMONIA in the last 168 hours. Coagulation Profile: No results for input(s): INR, PROTIME in the last 168 hours. Cardiac Enzymes: No results for input(s): CKTOTAL, CKMB, CKMBINDEX, TROPONINI in the last 168 hours. BNP (last 3 results) No results for input(s): PROBNP in the last 8760 hours. HbA1C: No results for input(s): HGBA1C in the last 72 hours. CBG: No results for input(s): GLUCAP in the last 168 hours. Lipid Profile: No results for input(s): CHOL, HDL, LDLCALC, TRIG, CHOLHDL, LDLDIRECT in the last 72 hours. Thyroid Function Tests: No results for input(s): TSH, T4TOTAL, FREET4, T3FREE, THYROIDAB in the last 72 hours. Anemia Panel: No results for input(s): VITAMINB12, FOLATE, FERRITIN, TIBC, IRON, RETICCTPCT in the last 72 hours. Urine analysis:    Component Value Date/Time   COLORURINE YELLOW (A) 05/13/2020 1532   APPEARANCEUR CLOUDY (A) 05/13/2020 1532   LABSPEC 1.018 05/13/2020 1532   PHURINE 5.0 05/13/2020 1532   GLUCOSEU NEGATIVE 05/13/2020 1532   HGBUR LARGE (A) 05/13/2020 1532   BILIRUBINUR NEGATIVE 05/13/2020 1532   KETONESUR NEGATIVE 05/13/2020 1532   PROTEINUR 30 (A) 05/13/2020 1532   NITRITE NEGATIVE 05/13/2020 1532   LEUKOCYTESUR LARGE (A) 05/13/2020 1532    Radiological Exams on Admission: CT ABDOMEN PELVIS WO CONTRAST  Result Date: 05/13/2020 CLINICAL DATA:  Acute nonlocalized abdominal pain. Patient reports lower abdominal pain onset last night. EXAM: CT ABDOMEN AND PELVIS WITHOUT CONTRAST TECHNIQUE: Multidetector CT imaging of the abdomen and pelvis was performed following the standard protocol without IV contrast. COMPARISON:  Remote abdominal CT 03/10/2008 FINDINGS: Lower chest: Normal heart size with pacemaker wires partially included. No acute airspace disease or pleural effusion. Hepatobiliary: Borderline hepatic steatosis. No evidence of focal hepatic lesion on noncontrast exam. Small calcified gallstones within  physiologically distended gallbladder. No pericholecystic inflammation. No common bile duct dilatation. Pancreas: No ductal dilatation or inflammation. Spleen: Normal in size without focal abnormality. Adrenals/Urinary Tract: Mild left adrenal thickening. No dominant nodule of either adrenal gland. Obstructing 5 x 7 mm stone at the right ureterovesicular junction with moderate hydronephrosis and prominent hydroureter. Moderate right perinephric edema. Additional nonobstructing stones in the lower right kidney, largest measures 6 mm. There is no left hydronephrosis. Small nonobstructing stones in the left kidney largest in the lower pole measuring 4 mm. There is no left ureteral calculus. Urinary bladder is partially distended. Stomach/Bowel: Small hiatal hernia.  Small amount of fluid in the distal esophagus. The stomach is physiologically distended. There are small duodenal diverticulum without acute inflammation. Normal positioning of the duodenum and ligament of Treitz. Diverticulum at the junction of the duodenum and jejunum without surrounding fat stranding. Fluid-filled small bowel that are nondilated or inflamed. Diminutive normal appendix. Multifocal colonic diverticulosis. Moderate volume of colonic stool. Sigmoid colon is tortuous and courses into the upper abdomen. No diverticulitis or colonic inflammation. Vascular/Lymphatic: Aorto bi-iliac atherosclerosis and tortuosity. No aortic aneurysm. There is no bulky abdominopelvic adenopathy. Reproductive: Enlarged prostate gland spans 5.4 cm and causes mass effect on the bladder base. Other: Fat in both inguinal canals. Minimal free fluid in the right pericolic gutter which is likely reactive. No free air. Tiny fat containing umbilical hernia Musculoskeletal: Multilevel degenerative change in the spine with grade 1 anterolisthesis of L3 on L4 and L4 on L5, likely facet mediated. Degenerative change of both hips. There are no acute or suspicious osseous  abnormalities. IMPRESSION: 1. Obstructing 5 x 7 mm stone at the right ureterovesicular junction with moderate hydronephrosis and prominent hydroureter. Moderate right perinephric edema. 2. Additional nonobstructing stones in both kidneys. 3. Colonic diverticulosis without diverticulitis. 4. Cholelithiasis without gallbladder inflammation. 5. Enlarged prostate gland causing mass effect on the bladder base. Aortic Atherosclerosis (ICD10-I70.0). Electronically Signed   By: Keith Rake M.D.   On: 05/13/2020 18:50   DG Chest 1 View  Result Date: 05/13/2020 CLINICAL DATA:  Chronic cough peer EXAM: CHEST  1 VIEW COMPARISON:  Radiograph 04/20/2015. Included lung bases from abdominal CT earlier today. 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 Improved cardiomegaly from 2016. Unchanged mediastinal contours with aortic atherosclerosis. Chronic interstitial coarsening. No confluent consolidation. No pleural fluid or pneumothorax. No acute osseous abnormalities are seen. Degenerative change in the left shoulder. IMPRESSION: 1. No acute abnormality. 2. Chronic interstitial coarsening. Electronically Signed   By: Keith Rake M.D.   On: 05/13/2020 18:54    EKG:  Not completed / ordered .  Assessment/Plan Principal Problem:   Severe sepsis (HCC) Active Problems:   Essential (primary) hypertension   HLD (hyperlipidemia)   Hyperthyroidism   Neuropathy   Benign prostatic hyperplasia with urinary obstruction   Calculus of kidney   CHB (complete heart block) ([place])   Type 2 diabetes mellitus with diabetic neuropathy, unspecified (HCC)  Severe sepsis secondary to UTI. Patient presented with fever, leucocytosis, tachycardia,  tachypnea,  Lactic acid 4.0,  source of infection: Abnormal UA. CT abdomen shows obstructing kidney stone at the right UVJ with moderate hydronephrosis and hydroureter. Admit sepsis pathway. Patient received 3 L bolus so far. Continue IV fluids. Follow-up blood and urine  cultures. Start ceftriaxone 1 g IV daily. Urology consulted, scheduled to have stent by Dr. Diamantina Providence  12/11. Adequate pain control Continue to monitor clinical status.  Kidney stones: CT abdomen shows obstructing kidney stone at right UVJ with moderate hydronephrosis and hydroureter. Urology consulted, Dr. Diamantina Providence planning to put stent 12/11  Acute kidney injury: Baseline creatinine 0.89-1.0 Serum creatinine 2.22, due to post obstructive uropathy. Continue IV hydration Avoid nephrotoxic medications Follow-up a.m. labs  Elevated liver enzymes: This could be due to sepsis Recheck labs tomorrow morning  Hypertension : Keep enalapril on hold, Hydralazine as needed.  History of complete heart block, status post pacemaker; Stable, Pacemaker recently interrogated.  BPH: Continue Flomax and Proscar  Diabetes mellitus: Start regular insulin sliding scale, Obtain hemoglobin A1c.  Neuropathy: Continue gabapentin  DVT prophylaxis: Lovenox Code Status: Full code Family  Communication: No one at bedside Disposition Plan: Home with home services versus SNF Consults called: Urology sninsky Admission status: Inpatient   Shawna Clamp MD [facility]   If 7PM-7AM, please contact night-coverage [email or web address]   05/13/2020, 8:12 PM

## 2020-05-13 NOTE — Op Note (Signed)
Date of procedure: 05/13/20  Preoperative diagnosis:  1. Right distal ureteral stone 2. Sepsis from urinary source   Postoperative diagnosis:  1. Same   Procedure: 1. Cystoscopy, laser lithotripsy of right distal ureteral stone, right ureteral stent placement  Surgeon: Nickolas Madrid, MD  Anesthesia: General  Complications: None  Intraoperative findings:  1. Prostate with large median lobe 2. Large stone crowning at right ureteral orifice, required partial laser lithotripsy to place wire and stent  EBL: Minimal  Specimens: None  Drains: Right 31F x 26cm ureteral stent, 40F 2 way coude foley  Indication: Todd Ortega is a 85 y.o. patient with 32mm right distal ureteral stone and sepsis from urinary source.  After reviewing the management options for treatment, they elected to proceed with the above surgical procedure(s). We have discussed the potential benefits and risks of the procedure, side effects of the proposed treatment, the likelihood of the patient achieving the goals of the procedure, and any potential problems that might occur during the procedure or recuperation. Informed consent has been obtained.  Description of procedure:  The patient was taken to the operating room and general anesthesia was induced. SCDs were placed for DVT prophylaxis. The patient was placed in the dorsal lithotomy position, prepped and draped in the usual sterile fashion, and preoperative antibiotics(ceftriaxone in ED) were administered. A preoperative time-out was performed.   15 French rigid cystoscope was used to intubate the urethra.  There was a wispy bulbar urethral stricture that was easily bypassed with the cystoscope.  The prostate was large with an intravesical median lobe.  Urine was cloudy and was irrigated free.  There were no obvious bladder tumors.  At the right ureteral orifice, there was a large yellow stone crowning and partially emanating from the orifice.  With the aid of an access  catheter I attempted to pass a sensor wire alongside the wire, but was unable to dislodge the stone to access the ureter.  At this point I felt the only way I would be able to access the collecting system would be fragmenting the stone at least partially.  A 242 m laser fiber was advanced through the ureteral access catheter and on settings of 1.0 J and 1 Hz the stone was directly fragmented.  After just a few pulses the stone was able to be pushed back into the distal ureter.  I was then able to advance the access catheter into the distal ureter, the laser was removed, and a sensor wire was able to be navigated up into the kidney under fluoroscopic vision.  A 6 French by 26 cm ureteral stent was then advanced over the wire with an excellent curl in the lower pole, as well as under direct vision the bladder.  There was purulent drainage through the side ports of the stent.  A 20 French coud Foley passed easily into the bladder with return of cloudy urine, and 10 cc were placed in the balloon.  Disposition: Stable to PACU  Plan: Remains intubated and in critical condition to ICU for pressors Follow-up cultures and continue broad-spectrum antibiotics and resuscitation Maintain Foley until clinically improved and afebrile > 24 hours Will arrange outpatient ureteroscopy and laser lithotripsy and 2 to 3 weeks when clinically improved and infection treated  Nickolas Madrid, MD

## 2020-05-13 NOTE — ED Notes (Signed)
Surgeon at bedside to update patient and family on procedure.Consent signed and pt up to OR.

## 2020-05-13 NOTE — Transfer of Care (Signed)
Immediate Anesthesia Transfer of Care Note  Patient: Todd Ortega  Procedure(s) Performed: CYSTOSCOPY RETROGRADE LASER  WITH STENT PLACEMENT RIGHT (Right )  Patient Location: PACU  Anesthesia Type:General  Level of Consciousness: sedated and Patient remains intubated per anesthesia plan  Airway & Oxygen Therapy: Patient remains intubated per anesthesia plan and Patient placed on Ventilator (see vital sign flow sheet for setting)  Post-op Assessment: Report given to RN and Post -op Vital signs reviewed and stable  Post vital signs: Reviewed and stable  Last Vitals:  Vitals Value Taken Time  BP 103/69 05/13/20 2315  Temp    Pulse 85 05/13/20 2318  Resp 30 05/13/20 2318  SpO2 95 % 05/13/20 2318  Vitals shown include unvalidated device data.  Last Pain:  Vitals:   05/13/20 2103  TempSrc: Oral  PainSc:          Complications: No anesthesia complications noted, pt remains intubated/sedated as planned [facility]

## 2020-05-14 ENCOUNTER — Encounter: Payer: Self-pay | Admitting: Family Medicine

## 2020-05-14 ENCOUNTER — Inpatient Hospital Stay: Payer: Medicare HMO

## 2020-05-14 DIAGNOSIS — A4159 Other Gram-negative sepsis: Principal | ICD-10-CM

## 2020-05-14 DIAGNOSIS — J9601 Acute respiratory failure with hypoxia: Secondary | ICD-10-CM

## 2020-05-14 DIAGNOSIS — N179 Acute kidney failure, unspecified: Secondary | ICD-10-CM

## 2020-05-14 LAB — BLOOD CULTURE ID PANEL (REFLEXED) - BCID2

## 2020-05-14 LAB — CBC
HCT: 42.2 % (ref 39.0–52.0)
Hemoglobin: 13.9 g/dL (ref 13.0–17.0)
MCH: 33 pg (ref 26.0–34.0)
MCHC: 32.9 g/dL (ref 30.0–36.0)
MCV: 100.2 fL — ABNORMAL HIGH (ref 80.0–100.0)
Platelets: 118 10*3/uL — ABNORMAL LOW (ref 150–400)
RBC: 4.21 MIL/uL — ABNORMAL LOW (ref 4.22–5.81)
RDW: 15.3 % (ref 11.5–15.5)
WBC: 32.3 10*3/uL — ABNORMAL HIGH (ref 4.0–10.5)
nRBC: 0 % (ref 0.0–0.2)

## 2020-05-14 LAB — GLUCOSE, CAPILLARY
Glucose-Capillary: 130 mg/dL — ABNORMAL HIGH (ref 70–99)
Glucose-Capillary: 131 mg/dL — ABNORMAL HIGH (ref 70–99)
Glucose-Capillary: 153 mg/dL — ABNORMAL HIGH (ref 70–99)
Glucose-Capillary: 175 mg/dL — ABNORMAL HIGH (ref 70–99)
Glucose-Capillary: 93 mg/dL (ref 70–99)

## 2020-05-14 LAB — COMPREHENSIVE METABOLIC PANEL
ALT: 35 U/L (ref 0–44)
AST: 67 U/L — ABNORMAL HIGH (ref 15–41)
Albumin: 2.9 g/dL — ABNORMAL LOW (ref 3.5–5.0)
Alkaline Phosphatase: 63 U/L (ref 38–126)
Anion gap: 11 (ref 5–15)
BUN: 37 mg/dL — ABNORMAL HIGH (ref 8–23)
CO2: 21 mmol/L — ABNORMAL LOW (ref 22–32)
Calcium: 8.7 mg/dL — ABNORMAL LOW (ref 8.9–10.3)
Chloride: 105 mmol/L (ref 98–111)
Creatinine, Ser: 1.69 mg/dL — ABNORMAL HIGH (ref 0.61–1.24)
GFR, Estimated: 40 mL/min — ABNORMAL LOW (ref >60)
Glucose, Bld: 156 mg/dL — ABNORMAL HIGH (ref 70–99)
Potassium: 4.2 mmol/L (ref 3.5–5.1)
Sodium: 137 mmol/L (ref 135–145)
Total Bilirubin: 0.9 mg/dL (ref 0.3–1.2)
Total Protein: 6.7 g/dL (ref 6.5–8.1)

## 2020-05-14 LAB — TSH: TSH: 0.14 u[IU]/mL — ABNORMAL LOW (ref 0.350–4.500)

## 2020-05-14 LAB — TRIGLYCERIDES: Triglycerides: 229 mg/dL — ABNORMAL HIGH (ref <150)

## 2020-05-14 LAB — LACTIC ACID, PLASMA: Lactic Acid, Venous: 4 mmol/L (ref 0.5–1.9)

## 2020-05-14 LAB — MRSA PCR SCREENING: MRSA by PCR: NEGATIVE

## 2020-05-14 LAB — PROTIME-INR
INR: 1.3 — ABNORMAL HIGH (ref 0.8–1.2)
Prothrombin Time: 15.2 seconds (ref 11.4–15.2)

## 2020-05-14 LAB — CORTISOL-AM, BLOOD: Cortisol - AM: 36.9 ug/dL — ABNORMAL HIGH (ref 6.7–22.6)

## 2020-05-14 LAB — PROCALCITONIN: Procalcitonin: 92.9 ng/mL

## 2020-05-14 MED ORDER — ONDANSETRON HCL 4 MG/2ML IJ SOLN: 4.0000 mg | Freq: Four times a day (QID) | INTRAMUSCULAR | Status: DC | PRN

## 2020-05-14 MED ORDER — GABAPENTIN 250 MG/5ML PO SOLN
300.0000 mg | Freq: Every day | ORAL | Status: DC
  Administered 2020-05-14: 08:00:00 300 mg
  Filled 2020-05-14 (×2): qty 6

## 2020-05-14 MED ORDER — LACTATED RINGERS IV BOLUS
1000.0000 mL | Freq: Once | INTRAVENOUS | Status: AC
  Administered 2020-05-14: 20:00:00 1000 mL via INTRAVENOUS

## 2020-05-14 MED ORDER — ORAL CARE MOUTH RINSE
15.0000 mL | Freq: Two times a day (BID) | OROMUCOSAL | Status: DC
  Administered 2020-05-14 – 2020-05-17 (×4): 15 mL via OROMUCOSAL

## 2020-05-14 MED ORDER — SODIUM CHLORIDE 0.9 % IV SOLN
2.0000 g | INTRAVENOUS | Status: DC
  Administered 2020-05-14 – 2020-05-17 (×4): 2 g via INTRAVENOUS
  Filled 2020-05-14 (×2): qty 2
  Filled 2020-05-14 (×3): qty 20

## 2020-05-14 MED ORDER — PHENYLEPHRINE HCL-NACL 10-0.9 MG/250ML-% IV SOLN
0.0000 ug/min | INTRAVENOUS | Status: DC
  Administered 2020-05-14 (×2): 50 ug/min via INTRAVENOUS
  Filled 2020-05-14 (×4): qty 250

## 2020-05-14 MED ORDER — PANTOPRAZOLE SODIUM 40 MG IV SOLR
40.0000 mg | INTRAVENOUS | Status: DC
  Administered 2020-05-14: 13:00:00 40 mg via INTRAVENOUS
  Filled 2020-05-14: qty 40

## 2020-05-14 MED ORDER — ONDANSETRON HCL 4 MG PO TABS: 4.0000 mg | ORAL_TABLET | Freq: Four times a day (QID) | ORAL | Status: DC | PRN

## 2020-05-14 MED ORDER — ASPIRIN 81 MG PO CHEW
81.0000 mg | CHEWABLE_TABLET | Freq: Every day | ORAL | Status: DC
  Filled 2020-05-14: qty 1

## 2020-05-14 MED ORDER — CHLORHEXIDINE GLUCONATE CLOTH 2 % EX PADS
6.0000 | MEDICATED_PAD | Freq: Every day | CUTANEOUS | Status: DC
  Administered 2020-05-14 – 2020-05-15 (×2): 6 via TOPICAL

## 2020-05-14 MED ORDER — ACETAMINOPHEN 650 MG RE SUPP: 650.0000 mg | Freq: Four times a day (QID) | RECTAL | Status: DC | PRN

## 2020-05-14 MED ORDER — ACETAMINOPHEN 325 MG PO TABS: 650.0000 mg | ORAL_TABLET | Freq: Four times a day (QID) | ORAL | Status: DC | PRN

## 2020-05-14 MED ORDER — LEVOTHYROXINE SODIUM 50 MCG PO TABS
175.0000 ug | ORAL_TABLET | Freq: Every day | ORAL | Status: DC
  Administered 2020-05-15: 06:00:00 175 ug
  Filled 2020-05-14: qty 2

## 2020-05-14 MED ORDER — SIMVASTATIN 20 MG PO TABS
40.0000 mg | ORAL_TABLET | Freq: Every day | ORAL | Status: DC
  Administered 2020-05-14: 08:00:00 40 mg
  Filled 2020-05-14 (×2): qty 2

## 2020-05-14 MED ORDER — ALBUTEROL SULFATE (2.5 MG/3ML) 0.083% IN NEBU: 2.5000 mg | INHALATION_SOLUTION | RESPIRATORY_TRACT | Status: DC | PRN

## 2020-05-14 NOTE — Progress Notes (Signed)
Patient pressure 75/49. Per Np increase neo to 184mcg

## 2020-05-14 NOTE — Progress Notes (Signed)
Report given to ICU. Patient taken over with respiratory.

## 2020-05-14 NOTE — Anesthesia Postprocedure Evaluation (Signed)
Anesthesia Post Note  Patient: Todd Ortega  Procedure(s) Performed: CYSTOSCOPY RETROGRADE LASER  WITH STENT PLACEMENT RIGHT (Right )  Patient location during evaluation: SICU Anesthesia Type: General Level of consciousness: awake and alert Pain management: pain level controlled Vital Signs Assessment: post-procedure vital signs reviewed and stable Respiratory status: patient remains intubated per anesthesia plan Cardiovascular status: stable Postop Assessment: no apparent nausea or vomiting Anesthetic complications: no   No complications documented.   Last Vitals:  Vitals:   05/14/20 1400 05/14/20 1430  BP: 93/66 96/63  Pulse: 72 73  Resp: 19 (!) 29  Temp:    SpO2: 95% 97%    Last Pain:  Vitals:   05/14/20 1200  TempSrc: Oral  PainSc:                  Myrla Malanowski K

## 2020-05-14 NOTE — Progress Notes (Signed)
Pt transported on vent from PACU to ICU without incident.

## 2020-05-14 NOTE — Progress Notes (Signed)
   Subjective Afebrile since surgery, remains intubated and sedated and on pressors  Physical Exam: BP 107/72   Pulse 66   Temp 99.8 F (37.7 C)   Resp 17   Ht 5\' 6"  (1.676 m)   Wt 72.6 kg   SpO2 99%   BMI 25.82 kg/m    Constitutional: Intubated and sedated Respiratory: Endotracheal tube in place GI: Abdomen is soft, non-tender, non-distended Drains: Foley with clear yellow urine  Laboratory Data: Reviewed WBC 32.3(15) sCr 1.69(2.22) -> Baseline 0.98 Lactic acid 4(4)  All blood cultures positive for Klebsiella, sensitivities pending  Assessment & Plan:   85 year old male with history of CAD and pacemaker POD#1 from right ureteral stent placement for 7 mm right distal ureteral stone with sepsis from urinary source.  Maintain Foley until clinically improved and afebrile > 24 hours Continue broad-spectrum antibiotics, narrow as able, recommend at least 2-week course Appreciate ICU/hospitalist care Will arrange outpatient ureteroscopy/laser lithotripsy/stent change in 3 to 4 weeks when clinically improved  Billey Co, MD

## 2020-05-14 NOTE — Progress Notes (Signed)
Follow up - Critical Care Medicine Note  Patient Details:    Todd Ortega is an 85 y.o. male  presenting with septic shock of urinary tract origin secondary to right distal ureteral stone and obstructive uropathy status post stent placement with postoperative respiratory failure requiring mechanical ventilation and vasopressors.  Lines, Airways, Drains: Airway 7 mm (Active)  Secured at (cm) 24 cm 05/14/20 0808  Measured From Lips 05/14/20 0808  Secured Location Left 05/14/20 0808  Secured By [employer] 05/14/20 0808  Tube Holder Repositioned Yes 05/14/20 0808  Prone position No 05/14/20 0808  Cuff Pressure (cm H2O) 28 cm H2O 05/14/20 0808  Site Condition Dry 05/14/20 0800     NG/OG Tube Orogastric Left mouth Xray Documented cm marking at nare/ corner of mouth 58 cm (Active)  Cm Marking at Nare/Corner of Mouth (if applicable) 69 cm 78/58/85 0800  Site Assessment Clean;Dry;Intact 05/14/20 0800  Ongoing Placement Verification No change in cm markings or external length of tube from initial placement;No change in respiratory status;No acute changes, not attributed to clinical condition 05/14/20 0800  Status Clamped 05/14/20 0800  Intake (mL) 100 mL 05/14/20 0800     Urethral Catheter Dr. Diamantina Providence Coude 20 Fr. (Active)  Indication for Insertion or Continuance of Catheter Unstable critically ill patients first 24-48 hours (See Criteria) 05/14/20 0800  Site Assessment Clean;Intact 05/14/20 0800  Catheter Maintenance Catheter secured;Drainage bag/tubing not touching floor;Insertion date on drainage bag;No dependent loops 05/14/20 0800  Collection Container Standard drainage bag 05/14/20 0800  Securement Method Securing device (Describe) 05/14/20 0800  Urinary Catheter Interventions (if applicable) Unclamped 02/77/41 0800  Output (mL) 330 mL 05/14/20 0800     Ureteral Drain/Stent Right ureter 6 Fr. (Active)  Dressing Status None 05/14/20 0800    Anti-infectives:   Anti-infectives (From admission, onward)   Start     Dose/Rate Route Frequency Ordered Stop   05/14/20 0700  cefTRIAXone (ROCEPHIN) 2 g in sodium chloride 0.9 % 100 mL IVPB        2 g 200 mL/hr over 30 Minutes Intravenous Every 24 hours 05/14/20 0451     05/13/20 2015  cefTRIAXone (ROCEPHIN) 1 g in sodium chloride 0.9 % 100 mL IVPB  Status:  Discontinued        1 g 200 mL/hr over 30 Minutes Intravenous Every 24 hours 05/13/20 2010 05/14/20 0450   05/13/20 1915  cefTRIAXone (ROCEPHIN) 1 g in sodium chloride 0.9 % 100 mL IVPB        1 g 200 mL/hr over 30 Minutes Intravenous  Once 05/13/20 1907 05/13/20 2011     Scheduled Meds: . aspirin  81 mg Per Tube Daily  . chlorhexidine gluconate (MEDLINE KIT)  15 mL Mouth Rinse BID  . Chlorhexidine Gluconate Cloth  6 each Topical Q0600  . docusate  100 mg Per Tube BID  . enoxaparin (LOVENOX) injection  30 mg Subcutaneous Q24H  . finasteride  5 mg Oral QHS  . gabapentin  300 mg Per Tube Daily  . [START ON 05/15/2020] levothyroxine  175 mcg Per Tube QAC breakfast  . mouth rinse  15 mL Mouth Rinse 10 times per day  . pantoprazole (PROTONIX) IV  40 mg Intravenous Q24H  . polyethylene glycol  17 g Per Tube Daily  . simvastatin  40 mg Per Tube Daily  . tamsulosin  0.4 mg Oral QPC breakfast   Continuous Infusions: . sodium chloride 50 mL/hr at 05/14/20 1000  . sodium chloride Stopped (05/14/20 0135)  .  cefTRIAXone (ROCEPHIN)  IV Stopped (05/14/20 0640)  . lactated ringers    . norepinephrine (LEVOPHED) Adult infusion 3 mcg/min (05/14/20 1000)  . phenylephrine (NEO-SYNEPHRINE) Adult infusion Stopped (05/14/20 0952)  . propofol (DIPRIVAN) infusion 35 mcg/kg/min (05/14/20 1000)   PRN Meds:.acetaminophen **OR** acetaminophen, albuterol, fentaNYL (SUBLIMAZE) injection, fentaNYL (SUBLIMAZE) injection, hydrALAZINE, ondansetron **OR** ondansetron (ZOFRAN) IV   Microbiology: Results for orders placed or performed during the hospital encounter of  05/13/20  Blood culture (routine x 2)     Status: None (Preliminary result)   Collection Time: 05/13/20  7:08 PM   Specimen: BLOOD RIGHT WRIST  Result Value Ref Range Status   Specimen Description BLOOD RIGHT WRIST  Final   Special Requests   Final    BOTTLES DRAWN AEROBIC AND ANAEROBIC Blood Culture results may not be optimal due to an excessive volume of blood received in culture bottles   Culture  Setup Time   Final    Organism ID to follow GRAM NEGATIVE RODS IN BOTH AEROBIC AND ANAEROBIC BOTTLES CRITICAL RESULT CALLED TO, READ BACK BY AND VERIFIED WITH: LISA KLUTZ ON 05/14/20 AT 0421 QSD Performed at [facility], [address] [place], [place] [address]    Culture GRAM NEGATIVE RODS  Final   Report Status PENDING  Incomplete  Blood Culture ID Panel (Reflexed)     Status: Abnormal   Collection Time: 05/13/20  7:08 PM  Result Value Ref Range Status   Enterococcus faecalis NOT DETECTED NOT DETECTED Final   Enterococcus Faecium NOT DETECTED NOT DETECTED Final   Listeria monocytogenes NOT DETECTED NOT DETECTED Final   Staphylococcus species NOT DETECTED NOT DETECTED Final   Staphylococcus aureus (BCID) NOT DETECTED NOT DETECTED Final   Staphylococcus epidermidis NOT DETECTED NOT DETECTED Final   Staphylococcus lugdunensis NOT DETECTED NOT DETECTED Final   Streptococcus species NOT DETECTED NOT DETECTED Final   Streptococcus agalactiae NOT DETECTED NOT DETECTED Final   Streptococcus pneumoniae NOT DETECTED NOT DETECTED Final   Streptococcus pyogenes NOT DETECTED NOT DETECTED Final   A.calcoaceticus-baumannii NOT DETECTED NOT DETECTED Final   Bacteroides fragilis NOT DETECTED NOT DETECTED Final   Enterobacterales DETECTED (A) NOT DETECTED Final    Comment: Enterobacterales represent a large order of gram negative bacteria, not a single organism. CRITICAL RESULT CALLED TO, READ BACK BY AND VERIFIED WITH: LISA KLUTZ ON 05/14/20 AT 0421 QSD    Enterobacter cloacae complex  NOT DETECTED NOT DETECTED Final   Escherichia coli NOT DETECTED NOT DETECTED Final   Klebsiella aerogenes NOT DETECTED NOT DETECTED Final   Klebsiella oxytoca DETECTED (A) NOT DETECTED Final    Comment: CRITICAL RESULT CALLED TO, READ BACK BY AND VERIFIED WITH: LISA KLUTZ ON 05/14/20 AT 0421 QSD    Klebsiella pneumoniae NOT DETECTED NOT DETECTED Final   Proteus species NOT DETECTED NOT DETECTED Final   Salmonella species NOT DETECTED NOT DETECTED Final   Serratia marcescens NOT DETECTED NOT DETECTED Final   Haemophilus influenzae NOT DETECTED NOT DETECTED Final   Neisseria meningitidis NOT DETECTED NOT DETECTED Final   Pseudomonas aeruginosa NOT DETECTED NOT DETECTED Final   Stenotrophomonas maltophilia NOT DETECTED NOT DETECTED Final   Candida albicans NOT DETECTED NOT DETECTED Final   Candida auris NOT DETECTED NOT DETECTED Final   Candida glabrata NOT DETECTED NOT DETECTED Final   Candida krusei NOT DETECTED NOT DETECTED Final   Candida parapsilosis NOT DETECTED NOT DETECTED Final   Candida tropicalis NOT DETECTED NOT DETECTED Final   Cryptococcus neoformans/gattii NOT DETECTED  NOT DETECTED Final   CTX-M ESBL NOT DETECTED NOT DETECTED Final   Carbapenem resistance IMP NOT DETECTED NOT DETECTED Final   Carbapenem resistance KPC NOT DETECTED NOT DETECTED Final   Carbapenem resistance NDM NOT DETECTED NOT DETECTED Final   Carbapenem resist OXA 48 LIKE NOT DETECTED NOT DETECTED Final   Carbapenem resistance VIM NOT DETECTED NOT DETECTED Final    Comment: Performed at [facility], [address] [place], [place] [address]  Resp Panel by RT-PCR (Flu A&B, Covid) Nasopharyngeal Swab     Status: None   Collection Time: 05/13/20  7:12 PM   Specimen: Nasopharyngeal Swab; Nasopharyngeal(NP) swabs in vial transport medium  Result Value Ref Range Status   SARS Coronavirus 2 by RT PCR NEGATIVE NEGATIVE Final    Comment: (NOTE) SARS-CoV-2 target nucleic acids are NOT  DETECTED.  The SARS-CoV-2 RNA is generally detectable in upper respiratory specimens during the acute phase of infection. The lowest concentration of SARS-CoV-2 viral copies this assay can detect is 138 copies/mL. A negative result does not preclude SARS-Cov-2 infection and should not be used as the sole basis for treatment or other patient management decisions. A negative result may occur with  improper specimen collection/handling, submission of specimen other than nasopharyngeal swab, presence of viral mutation(s) within the areas targeted by this assay, and inadequate number of viral copies(<138 copies/mL). A negative result must be combined with clinical observations, patient history, and epidemiological information. The expected result is Negative.  Fact Sheet for Patients:  EntrepreneurPulse.com.au  Fact Sheet for Healthcare Providers:  IncredibleEmployment.be  This test is no t yet approved or cleared by the [place] FDA and  has been authorized for detection and/or diagnosis of SARS-CoV-2 by FDA under an Emergency Use Authorization (EUA). This EUA will remain  in effect (meaning this test can be used) for the duration of the COVID-19 declaration under Section 564(b)(1) of the Act, 21 U.S.C.section 360bbb-3(b)(1), unless the authorization is terminated  or revoked sooner.       Influenza A by PCR NEGATIVE NEGATIVE Final   Influenza B by PCR NEGATIVE NEGATIVE Final    Comment: (NOTE) The Xpert Xpress SARS-CoV-2/FLU/RSV plus assay is intended as an aid in the diagnosis of influenza from Nasopharyngeal swab specimens and should not be used as a sole basis for treatment. Nasal washings and aspirates are unacceptable for Xpert Xpress SARS-CoV-2/FLU/RSV testing.  Fact Sheet for Patients: EntrepreneurPulse.com.au  Fact Sheet for Healthcare Providers: IncredibleEmployment.be  This test is not yet  approved or cleared by the [place] FDA and has been authorized for detection and/or diagnosis of SARS-CoV-2 by FDA under an Emergency Use Authorization (EUA). This EUA will remain in effect (meaning this test can be used) for the duration of the COVID-19 declaration under Section 564(b)(1) of the Act, 21 U.S.C. section 360bbb-3(b)(1), unless the authorization is terminated or revoked.  Performed at [facility], [address] [place], [place] [address]   Blood culture (routine x 2)     Status: None (Preliminary result)   Collection Time: 05/13/20  7:13 PM   Specimen: Left Antecubital; Blood  Result Value Ref Range Status   Specimen Description LEFT ANTECUBITAL  Final   Special Requests   Final    BOTTLES DRAWN AEROBIC AND ANAEROBIC Blood Culture results may not be optimal due to an inadequate volume of blood received in culture bottles   Culture  Setup Time   Final    GRAM NEGATIVE RODS IN BOTH AEROBIC AND  ANAEROBIC BOTTLES CRITICAL VALUE NOTED.  VALUE IS CONSISTENT WITH PREVIOUSLY REPORTED AND CALLED VALUE. Performed at [facility], [address] [place], [place] [address]    Culture GRAM NEGATIVE RODS  Final   Report Status PENDING  Incomplete  MRSA PCR Screening     Status: None   Collection Time: 05/14/20  3:40 AM   Specimen: Nasopharyngeal  Result Value Ref Range Status   MRSA by PCR NEGATIVE NEGATIVE Final    Comment:        The GeneXpert MRSA Assay (FDA approved for NASAL specimens only), is one component of a comprehensive MRSA colonization surveillance program. It is not intended to diagnose MRSA infection nor to guide or monitor treatment for MRSA infections. Performed at [facility], [address] [place], [place] [address]    Results for orders placed or performed during the hospital encounter of 05/13/20 (from the past 24 hour(s))  Urinalysis, Complete w Microscopic     Status: Abnormal   Collection Time: 05/13/20   3:32 PM  Result Value Ref Range   Color, Urine YELLOW (A) YELLOW   APPearance CLOUDY (A) CLEAR   Specific Gravity, Urine 1.018 1.005 - 1.030   pH 5.0 5.0 - 8.0   Glucose, UA NEGATIVE NEGATIVE mg/dL   Hgb urine dipstick LARGE (A) NEGATIVE   Bilirubin Urine NEGATIVE NEGATIVE   Ketones, ur NEGATIVE NEGATIVE mg/dL   Protein, ur 30 (A) NEGATIVE mg/dL   Nitrite NEGATIVE NEGATIVE   Leukocytes,Ua LARGE (A) NEGATIVE   RBC / HPF 21-50 0 - 5 RBC/hpf   WBC, UA >50 (H) 0 - 5 WBC/hpf   Bacteria, UA MANY (A) NONE SEEN   Squamous Epithelial / LPF 0-5 0 - 5   WBC Clumps PRESENT    Mucus PRESENT   Lipase, blood     Status: None   Collection Time: 05/13/20  3:36 PM  Result Value Ref Range   Lipase 33 11 - 51 U/L  Comprehensive metabolic panel     Status: Abnormal   Collection Time: 05/13/20  3:36 PM  Result Value Ref Range   Sodium 136 135 - 145 mmol/L   Potassium 3.6 3.5 - 5.1 mmol/L   Chloride 101 98 - 111 mmol/L   CO2 22 22 - 32 mmol/L   Glucose, Bld 124 (H) 70 - 99 mg/dL   BUN 35 (H) 8 - 23 mg/dL   Creatinine, Ser 2.22 (H) 0.61 - 1.24 mg/dL   Calcium 9.8 8.9 - 10.3 mg/dL   Total Protein 7.8 6.5 - 8.1 g/dL   Albumin 3.8 3.5 - 5.0 g/dL   AST 56 (H) 15 - 41 U/L   ALT 37 0 - 44 U/L   Alkaline Phosphatase 73 38 - 126 U/L   Total Bilirubin 1.4 (H) 0.3 - 1.2 mg/dL   GFR, Estimated 29 (L) >60 mL/min   Anion gap 13 5 - 15  CBC     Status: Abnormal   Collection Time: 05/13/20  3:36 PM  Result Value Ref Range   WBC 14.9 (H) 4.0 - 10.5 K/uL   RBC 4.49 4.22 - 5.81 MIL/uL   Hemoglobin 14.6 13.0 - 17.0 g/dL   HCT 43.6 39.0 - 52.0 %   MCV 97.1 80.0 - 100.0 fL   MCH 32.5 26.0 - 34.0 pg   MCHC 33.5 30.0 - 36.0 g/dL   RDW 14.7 11.5 - 15.5 %   Platelets 144 (L) 150 - 400 K/uL   nRBC 0.0 0.0 -  0.2 %  Blood culture (routine x 2)     Status: None (Preliminary result)   Collection Time: 05/13/20  7:08 PM   Specimen: BLOOD RIGHT WRIST  Result Value Ref Range   Specimen Description BLOOD RIGHT  WRIST    Special Requests      BOTTLES DRAWN AEROBIC AND ANAEROBIC Blood Culture results may not be optimal due to an excessive volume of blood received in culture bottles   Culture  Setup Time      Organism ID to follow GRAM NEGATIVE RODS IN BOTH AEROBIC AND ANAEROBIC BOTTLES CRITICAL RESULT CALLED TO, READ BACK BY AND VERIFIED WITH: [place] ON 05/14/20 AT 0421 QSD Performed at [facility], [address], [place], [place] [address]    Culture GRAM NEGATIVE RODS    Report Status PENDING   Blood Culture ID Panel (Reflexed)     Status: Abnormal   Collection Time: 05/13/20  7:08 PM  Result Value Ref Range   Enterococcus faecalis NOT DETECTED NOT DETECTED   Enterococcus Faecium NOT DETECTED NOT DETECTED   Listeria monocytogenes NOT DETECTED NOT DETECTED   Staphylococcus species NOT DETECTED NOT DETECTED   Staphylococcus aureus (BCID) NOT DETECTED NOT DETECTED   Staphylococcus epidermidis NOT DETECTED NOT DETECTED   Staphylococcus lugdunensis NOT DETECTED NOT DETECTED   Streptococcus species NOT DETECTED NOT DETECTED   Streptococcus agalactiae NOT DETECTED NOT DETECTED   Streptococcus pneumoniae NOT DETECTED NOT DETECTED   Streptococcus pyogenes NOT DETECTED NOT DETECTED   A.calcoaceticus-baumannii NOT DETECTED NOT DETECTED   Bacteroides fragilis NOT DETECTED NOT DETECTED   Enterobacterales DETECTED (A) NOT DETECTED   Enterobacter cloacae complex NOT DETECTED NOT DETECTED   Escherichia coli NOT DETECTED NOT DETECTED   Klebsiella aerogenes NOT DETECTED NOT DETECTED   Klebsiella oxytoca DETECTED (A) NOT DETECTED   Klebsiella pneumoniae NOT DETECTED NOT DETECTED   Proteus species NOT DETECTED NOT DETECTED   Salmonella species NOT DETECTED NOT DETECTED   Serratia marcescens NOT DETECTED NOT DETECTED   Haemophilus influenzae NOT DETECTED NOT DETECTED   Neisseria meningitidis NOT DETECTED NOT DETECTED   Pseudomonas aeruginosa NOT DETECTED NOT DETECTED   Stenotrophomonas  maltophilia NOT DETECTED NOT DETECTED   Candida albicans NOT DETECTED NOT DETECTED   Candida auris NOT DETECTED NOT DETECTED   Candida glabrata NOT DETECTED NOT DETECTED   Candida krusei NOT DETECTED NOT DETECTED   Candida parapsilosis NOT DETECTED NOT DETECTED   Candida tropicalis NOT DETECTED NOT DETECTED   Cryptococcus neoformans/gattii NOT DETECTED NOT DETECTED   CTX-M ESBL NOT DETECTED NOT DETECTED   Carbapenem resistance IMP NOT DETECTED NOT DETECTED   Carbapenem resistance KPC NOT DETECTED NOT DETECTED   Carbapenem resistance NDM NOT DETECTED NOT DETECTED   Carbapenem resist OXA 48 LIKE NOT DETECTED NOT DETECTED   Carbapenem resistance VIM NOT DETECTED NOT DETECTED  Resp Panel by RT-PCR (Flu A&B, Covid) Nasopharyngeal Swab     Status: None   Collection Time: 05/13/20  7:12 PM   Specimen: Nasopharyngeal Swab; Nasopharyngeal(NP) swabs in vial transport medium  Result Value Ref Range   SARS Coronavirus 2 by RT PCR NEGATIVE NEGATIVE   Influenza A by PCR NEGATIVE NEGATIVE   Influenza B by PCR NEGATIVE NEGATIVE  Blood culture (routine x 2)     Status: None (Preliminary result)   Collection Time: 05/13/20  7:13 PM   Specimen: Left Antecubital; Blood  Result Value Ref Range   Specimen Description LEFT ANTECUBITAL    Special Requests  BOTTLES DRAWN AEROBIC AND ANAEROBIC Blood Culture results may not be optimal due to an inadequate volume of blood received in culture bottles   Culture  Setup Time      GRAM NEGATIVE RODS IN BOTH AEROBIC AND ANAEROBIC BOTTLES CRITICAL VALUE NOTED.  VALUE IS CONSISTENT WITH PREVIOUSLY REPORTED AND CALLED VALUE. Performed at [facility], [address] [place], [place] [address]    Culture GRAM NEGATIVE RODS    Report Status PENDING   Lactic acid, plasma     Status: Abnormal   Collection Time: 05/13/20  7:57 PM  Result Value Ref Range   Lactic Acid, Venous 4.0 (HH) 0.5 - 1.9 mmol/L  Blood gas, arterial     Status: Abnormal  (Preliminary result)   Collection Time: 05/13/20 11:59 PM  Result Value Ref Range   FIO2 0.50    Delivery systems VENTILATOR    Mode PRESSURE REGULATED VOLUME CONTROL    VT 500 mL   Peep/cpap 5.0 cm H20   Inspiratory PAP PENDING    Expiratory PAP PENDING    pH, Arterial 7.35 7.350 - 7.450   pCO2 arterial 38 32.0 - 48.0 mmHg   pO2, Arterial 50 (L) 83.0 - 108.0 mmHg   Bicarbonate 21.0 20.0 - 28.0 mmol/L   Acid-base deficit 4.2 (H) 0.0 - 2.0 mmol/L   O2 Saturation 82.9 %   Patient temperature 37.0    Oxygen index PENDING    Collection site RIGHT RADIAL    Sample type ARTERIAL DRAW    Allens test (pass/fail) PASS PASS   Mechanical Rate 18   Glucose, capillary     Status: None   Collection Time: 05/14/20 12:58 AM  Result Value Ref Range   Glucose-Capillary 93 70 - 99 mg/dL   Comment 1 Document in Chart   MRSA PCR Screening     Status: None   Collection Time: 05/14/20  3:40 AM   Specimen: Nasopharyngeal  Result Value Ref Range   MRSA by PCR NEGATIVE NEGATIVE  Protime-INR     Status: Abnormal   Collection Time: 05/14/20  6:30 AM  Result Value Ref Range   Prothrombin Time 15.2 11.4 - 15.2 seconds   INR 1.3 (H) 0.8 - 1.2  Cortisol-am, blood     Status: Abnormal   Collection Time: 05/14/20  6:30 AM  Result Value Ref Range   Cortisol - AM 36.9 (H) 6.7 - 22.6 ug/dL  Procalcitonin     Status: None   Collection Time: 05/14/20  6:30 AM  Result Value Ref Range   Procalcitonin 92.90 ng/mL  Comprehensive metabolic panel     Status: Abnormal   Collection Time: 05/14/20  6:30 AM  Result Value Ref Range   Sodium 137 135 - 145 mmol/L   Potassium 4.2 3.5 - 5.1 mmol/L   Chloride 105 98 - 111 mmol/L   CO2 21 (L) 22 - 32 mmol/L   Glucose, Bld 156 (H) 70 - 99 mg/dL   BUN 37 (H) 8 - 23 mg/dL   Creatinine, Ser 1.69 (H) 0.61 - 1.24 mg/dL   Calcium 8.7 (L) 8.9 - 10.3 mg/dL   Total Protein 6.7 6.5 - 8.1 g/dL   Albumin 2.9 (L) 3.5 - 5.0 g/dL   AST 67 (H) 15 - 41 U/L   ALT 35 0 - 44 U/L    Alkaline Phosphatase 63 38 - 126 U/L   Total Bilirubin 0.9 0.3 - 1.2 mg/dL   GFR, Estimated 40 (L) >60 mL/min   Anion  gap 11 5 - 15  CBC     Status: Abnormal   Collection Time: 05/14/20  6:30 AM  Result Value Ref Range   WBC 32.3 (H) 4.0 - 10.5 K/uL   RBC 4.21 (L) 4.22 - 5.81 MIL/uL   Hemoglobin 13.9 13.0 - 17.0 g/dL   HCT 42.2 39.0 - 52.0 %   MCV 100.2 (H) 80.0 - 100.0 fL   MCH 33.0 26.0 - 34.0 pg   MCHC 32.9 30.0 - 36.0 g/dL   RDW 15.3 11.5 - 15.5 %   Platelets 118 (L) 150 - 400 K/uL   nRBC 0.0 0.0 - 0.2 %  Triglycerides     Status: Abnormal   Collection Time: 05/14/20  6:30 AM  Result Value Ref Range   Triglycerides 229 (H) <150 mg/dL  TSH     Status: Abnormal   Collection Time: 05/14/20  6:30 AM  Result Value Ref Range   TSH 0.140 (L) 0.350 - 4.500 uIU/mL  Lactic acid, plasma     Status: Abnormal   Collection Time: 05/14/20  7:10 AM  Result Value Ref Range   Lactic Acid, Venous 4.0 (HH) 0.5 - 1.9 mmol/L  Glucose, capillary     Status: Abnormal   Collection Time: 05/14/20 11:47 AM  Result Value Ref Range   Glucose-Capillary 175 (H) 70 - 99 mg/dL     Best Practice/Protocols:  Diet: NPO Pain/Anxiety/Delirium protocol (if indicated): Fentanyl IVP & propofol drip VAP protocol (if indicated): Initiated DVT prophylaxis: Enoxaparin sq GI prophylaxis: Protonix IV Glucose control: Monitor every 4, Target range 140-180 Mobility: Bedrest, mobilize as allowed by critical illness Last date of multidisciplinary goals of care discussion: 05/13/20 Family and staff present:Wife and son updated by urology- Dr. Diamantina Providence Summary of discussion: Leave patient intubated with ventilator support status post OR procedure, treating sepsis with ceftriaxone and continue vasopressor support for septic shock Follow up goals of care discussion due: 05/14/20 Code Status: Full Disposition: ICU  Events: 05/13/20 emergently taken to the OR for right ureteral stent placement, postop admitted to the  ICU intubated requiring mechanical ventilation and vasopressors 05/14/20 remains on the ventilator, FiO2 requirements down, vasopressor requirements decreased, plan SAT SBT today  Studies: CT ABDOMEN PELVIS WO CONTRAST  Result Date: 05/13/2020 CLINICAL DATA:  Acute nonlocalized abdominal pain. Patient reports lower abdominal pain onset last night. EXAM: CT ABDOMEN AND PELVIS WITHOUT CONTRAST TECHNIQUE: Multidetector CT imaging of the abdomen and pelvis was performed following the standard protocol without IV contrast. COMPARISON:  Remote abdominal CT 03/10/2008 FINDINGS: Lower chest: Normal heart size with pacemaker wires partially included. No acute airspace disease or pleural effusion. Hepatobiliary: Borderline hepatic steatosis. No evidence of focal hepatic lesion on noncontrast exam. Small calcified gallstones within physiologically distended gallbladder. No pericholecystic inflammation. No common bile duct dilatation. Pancreas: No ductal dilatation or inflammation. Spleen: Normal in size without focal abnormality. Adrenals/Urinary Tract: Mild left adrenal thickening. No dominant nodule of either adrenal gland. Obstructing 5 x 7 mm stone at the right ureterovesicular junction with moderate hydronephrosis and prominent hydroureter. Moderate right perinephric edema. Additional nonobstructing stones in the lower right kidney, largest measures 6 mm. There is no left hydronephrosis. Small nonobstructing stones in the left kidney largest in the lower pole measuring 4 mm. There is no left ureteral calculus. Urinary bladder is partially distended. Stomach/Bowel: Small hiatal hernia. Small amount of fluid in the distal esophagus. The stomach is physiologically distended. There are small duodenal diverticulum without acute inflammation. Normal positioning of the duodenum and  ligament of Treitz. Diverticulum at the junction of the duodenum and jejunum without surrounding fat stranding. Fluid-filled small bowel that  are nondilated or inflamed. Diminutive normal appendix. Multifocal colonic diverticulosis. Moderate volume of colonic stool. Sigmoid colon is tortuous and courses into the upper abdomen. No diverticulitis or colonic inflammation. Vascular/Lymphatic: Aorto bi-iliac atherosclerosis and tortuosity. No aortic aneurysm. There is no bulky abdominopelvic adenopathy. Reproductive: Enlarged prostate gland spans 5.4 cm and causes mass effect on the bladder base. Other: Fat in both inguinal canals. Minimal free fluid in the right pericolic gutter which is likely reactive. No free air. Tiny fat containing umbilical hernia Musculoskeletal: Multilevel degenerative change in the spine with grade 1 anterolisthesis of L3 on L4 and L4 on L5, likely facet mediated. Degenerative change of both hips. There are no acute or suspicious osseous abnormalities. IMPRESSION: 1. Obstructing 5 x 7 mm stone at the right ureterovesicular junction with moderate hydronephrosis and prominent hydroureter. Moderate right perinephric edema. 2. Additional nonobstructing stones in both kidneys. 3. Colonic diverticulosis without diverticulitis. 4. Cholelithiasis without gallbladder inflammation. 5. Enlarged prostate gland causing mass effect on the bladder base. Aortic Atherosclerosis (ICD10-I70.0). Electronically Signed   By: Keith Rake M.D.   On: 05/13/2020 18:50   DG Chest 1 View  Result Date: 05/13/2020 CLINICAL DATA:  Chronic cough peer EXAM: CHEST  1 VIEW COMPARISON:  Radiograph 04/20/2015. Included lung bases from abdominal CT earlier today. 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 Improved cardiomegaly from 2016. Unchanged mediastinal contours with aortic atherosclerosis. Chronic interstitial coarsening. No confluent consolidation. No pleural fluid or pneumothorax. No acute osseous abnormalities are seen. Degenerative change in the left shoulder. IMPRESSION: 1. No acute abnormality. 2. Chronic interstitial coarsening.  Electronically Signed   By: Keith Rake M.D.   On: 05/13/2020 18:54   DG Abd 1 View  Result Date: 05/14/2020 CLINICAL DATA:  OG tube placement, history of DM, hypertension. EXAM: ABDOMEN - 1 VIEW COMPARISON:  05/13/2020 and prior. FINDINGS: Non weighted enteric tube tip and side hole overlie the gastric body. Gas-filled upper abdominal bowel loops. Partially imaged right ureteral stent. IMPRESSION: Enteric tube tip and side hole overlie the gastric body. Electronically Signed   By: Primitivo Gauze M.D.   On: 05/14/2020 06:32   DG Chest Port 1 View  Result Date: 05/14/2020 CLINICAL DATA:  Encounter for intubation EXAM: PORTABLE CHEST 1 VIEW COMPARISON:  Radiograph 05/13/2020 FINDINGS: Endotracheal tube 4.2 cm from carina. NG tube in stomach. Mild central venous congestion. No pneumothorax. Potential airspace disease in the LEFT lower lobe. IMPRESSION: 1. Endotracheal tube in good position. 2. NG tube in stomach. 3. Potential LEFT lower lobe pneumonia. Electronically Signed   By: Suzy Bouchard M.D.   On: 05/14/2020 07:14   DG OR UROLOGY CYSTO IMAGE ([facility] ONLY)  Result Date: 05/13/2020 There is no interpretation for this exam.  This order is for images obtained during a surgical procedure.  Please See "Surgeries" Tab for more information regarding the procedure.    Consults: Treatment Team:  Billey Co, MD   Subjective:    Overnight Issues: Sedated on the ventilator, FiO2 and pressor requirements down.  Moving extremities spontaneously.  Plan SAT/SBT today.  Objective:  Vital signs for last 24 hours: Temp:  [97.9 F (36.6 C)-101 F (38.3 C)] 97.9 F (36.6 C) (12/12 0800) Pulse Rate:  [65-96] 66 (12/12 1000) Resp:  [15-26] 18 (12/12 1000) BP: (73-173)/(47-81) 95/60 (12/12 1000) SpO2:  [92 %-100 %] 100 % (12/12 1000) FiO2 (%):  [  50 %] 50 % (12/12 0808) Weight:  [72.6 kg] 72.6 kg (12/11 1530)  Hemodynamic parameters for last 24 hours:    Intake/Output from  previous day: 12/11 0701 - 12/12 0700 In: 780.7 [I.V.:680.7; IV Piggyback:100] Out: 1025 [Urine:1025]  Intake/Output this shift: Total I/O In: 490.2 [I.V.:390.2; NG/GT:100] Out: 330 [Urine:330]  Vent settings for last 24 hours: Vent Mode: PRVC FiO2 (%):  [50 %] 50 % Set Rate:  [18 bmp] 18 bmp Vt Set:  [500 mL] 500 mL PEEP:  [5 cmH20] 5 cmH20 Plateau Pressure:  [20 cmH20] 20 cmH20  Physical Exam:  General: Adult male, critically ill, lying in bed intubated & sedated requiring mechanical ventilation.  Moves all 4 spontaneously HEENT: MM pink/moist, anicteric, atraumatic, neck supple, no diaphoresis, orotracheally intubated Neuro: sedated, unable to follow commands, PERRL +3 CV: s1s2 paced rhythm on monitor, no r/m/g Pulm: Regular, synchronous on PRVC 30 %/ PEEP 5, breath sounds coarse throughout  GI: soft, nondistended, bs x 4 GU: foley in place - no clots noted Skin: no rashes/lesions noted Extremities: warm/dry, pulses + 2 R/P, no edema noted  Assessment/Plan:  Severe sepsis with septic shock secondary to obstructing right ureteral stone Status post right ureteral cystoscopy, laser lithotripsy and right ureteral stent placement.  Aggressively IVF resuscitated in the ED- 3 L LR bolus.  Lactic was 4 on arrival.  Patient requiring vasopressor support in PACU. -Titrate FiO2 for SpO2 > 90% - Cultures show Klebsiella oxytoca - Lactic acid remains at 4.0 - Daily CBC - monitor WBC/ fever curve - IV antibiotics: Ceftriaxone - Continue vasopressors: Levophed MAP< 65 - Strict I/O's: alert provider if UOP < 0.5 mL/kg/hr - Continue volume resuscitation - Check 2D echo given persistent lactic acidosis  Acute postoperative hypoxic respiratory failure  Patient unable to be weaned off the ventilator in PACU status post stent placement.  Patient reportedly tachypneic, with respiration rate in the 40s and diaphoretic - Ventilator settings: PRVC  8 mL/kg, 30 % FiO2, 5 PEEP, continue  ventilator support & lung protective strategies - Wean PEEP & FiO2 as tolerated, maintain SpO2 > 90% - Head of bed elevated 30 degrees, VAP protocol in place - Plateau pressures less than 30 cm H20  - Intermittent chest x-ray & ABG PRN - Daily WUA with SBT as tolerated, proceed with this today - Ensure adequate pulmonary hygiene  - cultures show Klebsiella as above - PAD protocol in place: continue Fentanyl IVP & Propofol drip  Acute kidney injury secondary to obstructing right ureteral stone Kidney stones BPH CT abdomen shows obstructing kidney stone to the right UVJ with moderate hydronephrosis and hydroureter status post lithotripsy and right ureter stent placement- 12/11.  Baseline creatinine: 0.9, current creatinine: 2.22 - Continue coud catheter, monitor hematuria for clots - Strict I/O's: alert provider if UOP < 0.5 mL/kg/hr - Daily BMP, replace electrolytes PRN - Avoid nephrotoxic agents as able, ensure adequate renal perfusion - urology following, appreciate input - Continue ceftriaxone as above - Continue Flomax and Proscar home regimen for BPH  Hypertension -Home regimen on hold in the setting of hypotension due to septic shock  Hypothyroidism -Continue Synthroid daily  Type 2 diabetes mellitus -Every 4 CBG monitoring -Patient NPO currently -Initiate SSI as needed -Follow ICU hypo/hyperglycemia protocol      LOS: 1 day   Additional comments: Care coordination was performed with bedside nurse.  Critical Care Total Time*: 40 Minutes  C. Derrill Kay, MD [facility] 05/14/2020  *Care during the described time interval  was provided by me and/or other providers on the critical care team.  I have reviewed this patient's available data, including medical history, events of note, physical examination and test results as part of my evaluation.  **This note was dictated using voice recognition software/Dragon.  Despite best efforts to proofread, errors can  occur which can change the meaning.  Any change was purely unintentional.

## 2020-05-14 NOTE — Progress Notes (Signed)
[facility] Progress Note Patient Name: Todd Ortega DOB: [birth year] MRN: [id number]   Date of Service  05/14/2020  HPI/Events of Note  Patient admitted through the emergency department with septic shock of urinary tract origin secondary to right distal ureteral stone and obstructive uropathy. Patient is s/p laser therapy followed by right ureteral stent placement. Patient has post-operative respiratory failure, is on the ventilator, and is also on pressors.  eICU Interventions  New Patient Evaluation completed.        Kerry Kass Cynara Tatham 05/14/2020, 5:35 AM

## 2020-05-14 NOTE — Progress Notes (Signed)
PHARMACY - PHYSICIAN COMMUNICATION CRITICAL VALUE ALERT - BLOOD CULTURE IDENTIFICATION (BCID)  4 of 4 bottles positive for Klebsiella oxytoca.  Name of physician (or Provider) Contacted: Jonny Ruiz, NP  Changes to prescribed antibiotics required: Okay to Increase Ceftriaxone to 2 grams daily per Abx algorithm.  Renda Rolls, PharmD, [facility] 05/14/2020 4:55 AM

## 2020-05-15 ENCOUNTER — Inpatient Hospital Stay: Admit: 2020-05-15 | Payer: Medicare HMO

## 2020-05-15 ENCOUNTER — Encounter: Payer: Self-pay | Admitting: Urology

## 2020-05-15 ENCOUNTER — Inpatient Hospital Stay
Admit: 2020-05-15 | Discharge: 2020-05-15 | Disposition: A | Discharge status: Home / Self Care | Payer: Medicare HMO | Attending: Pulmonary Disease | Admitting: Pulmonary Disease

## 2020-05-15 DIAGNOSIS — N390 Urinary tract infection, site not specified: Secondary | ICD-10-CM

## 2020-05-15 DIAGNOSIS — R7881 Bacteremia: Secondary | ICD-10-CM

## 2020-05-15 DIAGNOSIS — N138 Other obstructive and reflux uropathy: Secondary | ICD-10-CM

## 2020-05-15 DIAGNOSIS — I442 Atrioventricular block, complete: Secondary | ICD-10-CM

## 2020-05-15 DIAGNOSIS — N201 Calculus of ureter: Secondary | ICD-10-CM

## 2020-05-15 DIAGNOSIS — N401 Enlarged prostate with lower urinary tract symptoms: Secondary | ICD-10-CM

## 2020-05-15 LAB — ECHOCARDIOGRAM COMPLETE
AR max vel: 1.63 cm2
AV Area VTI: 1.56 cm2
AV Area mean vel: 1.54 cm2
AV Mean grad: 3 mmHg
AV Peak grad: 6.7 mmHg
Ao pk vel: 1.29 m/s
Area-P 1/2: 4.59 cm2
Height: 66 in
S' Lateral: 2.37 cm
Weight: 2560 oz

## 2020-05-15 LAB — BASIC METABOLIC PANEL
Anion gap: 8 (ref 5–15)
BUN: 34 mg/dL — ABNORMAL HIGH (ref 8–23)
CO2: 25 mmol/L (ref 22–32)
Calcium: 8.6 mg/dL — ABNORMAL LOW (ref 8.9–10.3)
Chloride: 108 mmol/L (ref 98–111)
Creatinine, Ser: 1.09 mg/dL (ref 0.61–1.24)
GFR, Estimated: >60 mL/min (ref >60)
Glucose, Bld: 123 mg/dL — ABNORMAL HIGH (ref 70–99)
Potassium: 4.1 mmol/L (ref 3.5–5.1)
Sodium: 141 mmol/L (ref 135–145)

## 2020-05-15 LAB — MAGNESIUM: Magnesium: 2 mg/dL (ref 1.7–2.4)

## 2020-05-15 LAB — CBC
HCT: 39.2 % (ref 39.0–52.0)
Hemoglobin: 13.2 g/dL (ref 13.0–17.0)
MCH: 32.7 pg (ref 26.0–34.0)
MCHC: 33.7 g/dL (ref 30.0–36.0)
MCV: 97 fL (ref 80.0–100.0)
Platelets: 92 10*3/uL — ABNORMAL LOW (ref 150–400)
RBC: 4.04 MIL/uL — ABNORMAL LOW (ref 4.22–5.81)
RDW: 15.1 % (ref 11.5–15.5)
WBC: 27.6 10*3/uL — ABNORMAL HIGH (ref 4.0–10.5)
nRBC: 0 % (ref 0.0–0.2)

## 2020-05-15 LAB — PROCALCITONIN: Procalcitonin: 69.16 ng/mL

## 2020-05-15 LAB — GLUCOSE, CAPILLARY
Glucose-Capillary: 84 mg/dL (ref 70–99)
Glucose-Capillary: 120 mg/dL — ABNORMAL HIGH (ref 70–99)
Glucose-Capillary: 98 mg/dL (ref 70–99)

## 2020-05-15 LAB — LACTIC ACID, PLASMA: Lactic Acid, Venous: 2.6 mmol/L (ref 0.5–1.9)

## 2020-05-15 LAB — PHOSPHORUS: Phosphorus: 1.6 mg/dL — ABNORMAL LOW (ref 2.5–4.6)

## 2020-05-15 MED ORDER — ACETAMINOPHEN 325 MG PO TABS: 650.0000 mg | ORAL_TABLET | Freq: Four times a day (QID) | ORAL | Status: DC | PRN

## 2020-05-15 MED ORDER — ACETAMINOPHEN 650 MG RE SUPP: 650.0000 mg | Freq: Four times a day (QID) | RECTAL | Status: DC | PRN

## 2020-05-15 MED ORDER — ENOXAPARIN SODIUM 40 MG/0.4ML ~~LOC~~ SOLN
40.0000 mg | SUBCUTANEOUS | Status: DC
  Administered 2020-05-15 – 2020-05-16 (×2): 40 mg via SUBCUTANEOUS
  Filled 2020-05-15 (×2): qty 0.4

## 2020-05-15 MED ORDER — PANTOPRAZOLE SODIUM 40 MG PO TBEC
40.0000 mg | DELAYED_RELEASE_TABLET | Freq: Every day | ORAL | Status: DC
  Administered 2020-05-15 – 2020-05-17 (×3): 40 mg via ORAL
  Filled 2020-05-15 (×3): qty 1

## 2020-05-15 MED ORDER — LEVOTHYROXINE SODIUM 50 MCG PO TABS
175.0000 ug | ORAL_TABLET | Freq: Every day | ORAL | Status: DC
  Administered 2020-05-16: 05:00:00 175 ug via ORAL
  Filled 2020-05-15: qty 1

## 2020-05-15 MED ORDER — POTASSIUM & SODIUM PHOSPHATES 280-160-250 MG PO PACK
2.0000 | PACK | ORAL | Status: AC
  Administered 2020-05-15 (×2): 2 via ORAL
  Filled 2020-05-15 (×2): qty 2

## 2020-05-15 MED ORDER — DOCUSATE SODIUM 50 MG/5ML PO LIQD
100.0000 mg | Freq: Two times a day (BID) | ORAL | Status: DC
  Administered 2020-05-15 – 2020-05-16 (×2): 100 mg via ORAL
  Filled 2020-05-15 (×5): qty 10

## 2020-05-15 MED ORDER — ONDANSETRON HCL 4 MG PO TABS: 4.0000 mg | ORAL_TABLET | Freq: Four times a day (QID) | ORAL | Status: DC | PRN

## 2020-05-15 MED ORDER — ONDANSETRON HCL 4 MG/2ML IJ SOLN: 4.0000 mg | Freq: Four times a day (QID) | INTRAMUSCULAR | Status: DC | PRN

## 2020-05-15 MED ORDER — SIMVASTATIN 40 MG PO TABS
40.0000 mg | ORAL_TABLET | Freq: Every day | ORAL | Status: DC
  Administered 2020-05-15 – 2020-05-17 (×3): 40 mg via ORAL
  Filled 2020-05-15 (×2): qty 1

## 2020-05-15 MED ORDER — ASPIRIN 81 MG PO CHEW
81.0000 mg | CHEWABLE_TABLET | Freq: Every day | ORAL | Status: DC
  Administered 2020-05-15 – 2020-05-17 (×3): 81 mg via ORAL
  Filled 2020-05-15 (×2): qty 1

## 2020-05-15 MED ORDER — GABAPENTIN 300 MG PO CAPS
300.0000 mg | ORAL_CAPSULE | Freq: Every day | ORAL | Status: DC
  Administered 2020-05-15 – 2020-05-17 (×3): 300 mg via ORAL
  Filled 2020-05-15 (×3): qty 1

## 2020-05-15 MED ORDER — PREDNISOLONE ACETATE 1 % OP SUSP
1.0000 [drp] | Freq: Every day | OPHTHALMIC | Status: DC
  Administered 2020-05-15 – 2020-05-16 (×2): 1 [drp] via OPHTHALMIC
  Filled 2020-05-15 (×2): qty 1

## 2020-05-15 MED ORDER — POLYETHYLENE GLYCOL 3350 17 G PO PACK
17.0000 g | PACK | Freq: Every day | ORAL | Status: DC
  Filled 2020-05-15 (×2): qty 1

## 2020-05-15 MED ORDER — PREDNISOLONE ACETATE 1 % OP SUSP
1.0000 [drp] | Freq: Every day | OPHTHALMIC | Status: DC
  Filled 2020-05-15 (×3): qty 1

## 2020-05-15 NOTE — Progress Notes (Signed)
Report called to [facility] on [place].

## 2020-05-15 NOTE — Progress Notes (Signed)
*  PRELIMINARY RESULTS* Echocardiogram 2D Echocardiogram has been performed.  Todd Ortega 05/15/2020, 11:48 AM

## 2020-05-15 NOTE — Progress Notes (Signed)
1600 Transferred to 213 via bed. Wife with patient.

## 2020-05-15 NOTE — Progress Notes (Signed)
Follow up - Critical Care Medicine Note  Patient Details:    Todd Ortega is an 85 y.o. male  presenting with septic shock of urinary tract origin secondary to right distal ureteral stone and obstructive uropathy status post stent placement with postoperative respiratory failure requiring mechanical ventilation and vasopressors.  Lines, Airways, Drains: Airway 7 mm (Active)  Secured at (cm) 24 cm 05/14/20 0808  Measured From Lips 05/14/20 0808  Secured Location Left 05/14/20 0808  Secured By [employer] 05/14/20 0808  Tube Holder Repositioned Yes 05/14/20 0808  Prone position No 05/14/20 0808  Cuff Pressure (cm H2O) 28 cm H2O 05/14/20 0808  Site Condition Dry 05/14/20 0800     NG/OG Tube Orogastric Left mouth Xray Documented cm marking at nare/ corner of mouth 58 cm (Active)  Cm Marking at Nare/Corner of Mouth (if applicable) 69 cm 14/97/02 0800  Site Assessment Clean;Dry;Intact 05/14/20 0800  Ongoing Placement Verification No change in cm markings or external length of tube from initial placement;No change in respiratory status;No acute changes, not attributed to clinical condition 05/14/20 0800  Status Clamped 05/14/20 0800  Intake (mL) 100 mL 05/14/20 0800     Urethral Catheter Dr. Diamantina Providence Coude 20 Fr. (Active)  Indication for Insertion or Continuance of Catheter Unstable critically ill patients first 24-48 hours (See Criteria) 05/14/20 0800  Site Assessment Clean;Intact 05/14/20 0800  Catheter Maintenance Catheter secured;Drainage bag/tubing not touching floor;Insertion date on drainage bag;No dependent loops 05/14/20 0800  Collection Container Standard drainage bag 05/14/20 0800  Securement Method Securing device (Describe) 05/14/20 0800  Urinary Catheter Interventions (if applicable) Unclamped 63/78/58 0800  Output (mL) 330 mL 05/14/20 0800     Ureteral Drain/Stent Right ureter 6 Fr. (Active)  Dressing Status None 05/14/20 0800    Anti-infectives:   Anti-infectives (From admission, onward)   Start     Dose/Rate Route Frequency Ordered Stop   05/14/20 0700  cefTRIAXone (ROCEPHIN) 2 g in sodium chloride 0.9 % 100 mL IVPB        2 g 200 mL/hr over 30 Minutes Intravenous Every 24 hours 05/14/20 0451     05/13/20 2015  cefTRIAXone (ROCEPHIN) 1 g in sodium chloride 0.9 % 100 mL IVPB  Status:  Discontinued        1 g 200 mL/hr over 30 Minutes Intravenous Every 24 hours 05/13/20 2010 05/14/20 0450   05/13/20 1915  cefTRIAXone (ROCEPHIN) 1 g in sodium chloride 0.9 % 100 mL IVPB        1 g 200 mL/hr over 30 Minutes Intravenous  Once 05/13/20 1907 05/13/20 2011     Scheduled Meds: . aspirin  81 mg Per Tube Daily  . Chlorhexidine Gluconate Cloth  6 each Topical Q0600  . docusate  100 mg Per Tube BID  . enoxaparin (LOVENOX) injection  30 mg Subcutaneous Q24H  . finasteride  5 mg Oral QHS  . gabapentin  300 mg Per Tube Daily  . levothyroxine  175 mcg Per Tube QAC breakfast  . mouth rinse  15 mL Mouth Rinse BID  . pantoprazole (PROTONIX) IV  40 mg Intravenous Q24H  . polyethylene glycol  17 g Per Tube Daily  . simvastatin  40 mg Per Tube Daily  . tamsulosin  0.4 mg Oral QPC breakfast   Continuous Infusions: . sodium chloride Stopped (05/15/20 0610)  . sodium chloride Stopped (05/14/20 0135)  . cefTRIAXone (ROCEPHIN)  IV 200 mL/hr at 05/15/20 8502  . lactated ringers    . norepinephrine (LEVOPHED) Adult  infusion Stopped (05/15/20 0437)   PRN Meds:.acetaminophen **OR** acetaminophen, albuterol, fentaNYL (SUBLIMAZE) injection, fentaNYL (SUBLIMAZE) injection, hydrALAZINE, ondansetron **OR** ondansetron (ZOFRAN) IV   Microbiology: Results for orders placed or performed during the hospital encounter of 05/13/20  Blood culture (routine x 2)     Status: None (Preliminary result)   Collection Time: 05/13/20  7:08 PM   Specimen: BLOOD RIGHT WRIST  Result Value Ref Range Status   Specimen Description BLOOD RIGHT WRIST  Final   Special  Requests   Final    BOTTLES DRAWN AEROBIC AND ANAEROBIC Blood Culture results may not be optimal due to an excessive volume of blood received in culture bottles   Culture  Setup Time   Final    Organism ID to follow GRAM NEGATIVE RODS IN BOTH AEROBIC AND ANAEROBIC BOTTLES CRITICAL RESULT CALLED TO, READ BACK BY AND VERIFIED WITH: LISA KLUTZ ON 05/14/20 AT 0421 QSD Performed at [facility], [address] [place], [place] [address]    Culture GRAM NEGATIVE RODS  Final   Report Status PENDING  Incomplete  Blood Culture ID Panel (Reflexed)     Status: Abnormal   Collection Time: 05/13/20  7:08 PM  Result Value Ref Range Status   Enterococcus faecalis NOT DETECTED NOT DETECTED Final   Enterococcus Faecium NOT DETECTED NOT DETECTED Final   Listeria monocytogenes NOT DETECTED NOT DETECTED Final   Staphylococcus species NOT DETECTED NOT DETECTED Final   Staphylococcus aureus (BCID) NOT DETECTED NOT DETECTED Final   Staphylococcus epidermidis NOT DETECTED NOT DETECTED Final   Staphylococcus lugdunensis NOT DETECTED NOT DETECTED Final   Streptococcus species NOT DETECTED NOT DETECTED Final   Streptococcus agalactiae NOT DETECTED NOT DETECTED Final   Streptococcus pneumoniae NOT DETECTED NOT DETECTED Final   Streptococcus pyogenes NOT DETECTED NOT DETECTED Final   A.calcoaceticus-baumannii NOT DETECTED NOT DETECTED Final   Bacteroides fragilis NOT DETECTED NOT DETECTED Final   Enterobacterales DETECTED (A) NOT DETECTED Final    Comment: Enterobacterales represent a large order of gram negative bacteria, not a single organism. CRITICAL RESULT CALLED TO, READ BACK BY AND VERIFIED WITH: LISA KLUTZ ON 05/14/20 AT 0421 QSD    Enterobacter cloacae complex NOT DETECTED NOT DETECTED Final   Escherichia coli NOT DETECTED NOT DETECTED Final   Klebsiella aerogenes NOT DETECTED NOT DETECTED Final   Klebsiella oxytoca DETECTED (A) NOT DETECTED Final    Comment: CRITICAL RESULT CALLED TO,  READ BACK BY AND VERIFIED WITH: LISA KLUTZ ON 05/14/20 AT 0421 QSD    Klebsiella pneumoniae NOT DETECTED NOT DETECTED Final   Proteus species NOT DETECTED NOT DETECTED Final   Salmonella species NOT DETECTED NOT DETECTED Final   Serratia marcescens NOT DETECTED NOT DETECTED Final   Haemophilus influenzae NOT DETECTED NOT DETECTED Final   Neisseria meningitidis NOT DETECTED NOT DETECTED Final   Pseudomonas aeruginosa NOT DETECTED NOT DETECTED Final   Stenotrophomonas maltophilia NOT DETECTED NOT DETECTED Final   Candida albicans NOT DETECTED NOT DETECTED Final   Candida auris NOT DETECTED NOT DETECTED Final   Candida glabrata NOT DETECTED NOT DETECTED Final   Candida krusei NOT DETECTED NOT DETECTED Final   Candida parapsilosis NOT DETECTED NOT DETECTED Final   Candida tropicalis NOT DETECTED NOT DETECTED Final   Cryptococcus neoformans/gattii NOT DETECTED NOT DETECTED Final   CTX-M ESBL NOT DETECTED NOT DETECTED Final   Carbapenem resistance IMP NOT DETECTED NOT DETECTED Final   Carbapenem resistance KPC NOT DETECTED NOT DETECTED Final   Carbapenem resistance NDM  NOT DETECTED NOT DETECTED Final   Carbapenem resist OXA 48 LIKE NOT DETECTED NOT DETECTED Final   Carbapenem resistance VIM NOT DETECTED NOT DETECTED Final    Comment: Performed at [facility], [address] [place], [place] [address]  Resp Panel by RT-PCR (Flu A&B, Covid) Nasopharyngeal Swab     Status: None   Collection Time: 05/13/20  7:12 PM   Specimen: Nasopharyngeal Swab; Nasopharyngeal(NP) swabs in vial transport medium  Result Value Ref Range Status   SARS Coronavirus 2 by RT PCR NEGATIVE NEGATIVE Final    Comment: (NOTE) SARS-CoV-2 target nucleic acids are NOT DETECTED.  The SARS-CoV-2 RNA is generally detectable in upper respiratory specimens during the acute phase of infection. The lowest concentration of SARS-CoV-2 viral copies this assay can detect is 138 copies/mL. A negative result does not  preclude SARS-Cov-2 infection and should not be used as the sole basis for treatment or other patient management decisions. A negative result may occur with  improper specimen collection/handling, submission of specimen other than nasopharyngeal swab, presence of viral mutation(s) within the areas targeted by this assay, and inadequate number of viral copies(<138 copies/mL). A negative result must be combined with clinical observations, patient history, and epidemiological information. The expected result is Negative.  Fact Sheet for Patients:  EntrepreneurPulse.com.au  Fact Sheet for Healthcare Providers:  IncredibleEmployment.be  This test is no t yet approved or cleared by the [place] FDA and  has been authorized for detection and/or diagnosis of SARS-CoV-2 by FDA under an Emergency Use Authorization (EUA). This EUA will remain  in effect (meaning this test can be used) for the duration of the COVID-19 declaration under Section 564(b)(1) of the Act, 21 U.S.C.section 360bbb-3(b)(1), unless the authorization is terminated  or revoked sooner.       Influenza A by PCR NEGATIVE NEGATIVE Final   Influenza B by PCR NEGATIVE NEGATIVE Final    Comment: (NOTE) The Xpert Xpress SARS-CoV-2/FLU/RSV plus assay is intended as an aid in the diagnosis of influenza from Nasopharyngeal swab specimens and should not be used as a sole basis for treatment. Nasal washings and aspirates are unacceptable for Xpert Xpress SARS-CoV-2/FLU/RSV testing.  Fact Sheet for Patients: EntrepreneurPulse.com.au  Fact Sheet for Healthcare Providers: IncredibleEmployment.be  This test is not yet approved or cleared by the [place] FDA and has been authorized for detection and/or diagnosis of SARS-CoV-2 by FDA under an Emergency Use Authorization (EUA). This EUA will remain in effect (meaning this test can be used) for the  duration of the COVID-19 declaration under Section 564(b)(1) of the Act, 21 U.S.C. section 360bbb-3(b)(1), unless the authorization is terminated or revoked.  Performed at [facility], [address] [place], [place] [address]   Blood culture (routine x 2)     Status: None (Preliminary result)   Collection Time: 05/13/20  7:13 PM   Specimen: Left Antecubital; Blood  Result Value Ref Range Status   Specimen Description LEFT ANTECUBITAL  Final   Special Requests   Final    BOTTLES DRAWN AEROBIC AND ANAEROBIC Blood Culture results may not be optimal due to an inadequate volume of blood received in culture bottles   Culture  Setup Time   Final    GRAM NEGATIVE RODS IN BOTH AEROBIC AND ANAEROBIC BOTTLES CRITICAL VALUE NOTED.  VALUE IS CONSISTENT WITH PREVIOUSLY REPORTED AND CALLED VALUE. Performed at [facility], [address] [place], [place] [address]    [place]  Final  Report Status PENDING  Incomplete  MRSA PCR Screening     Status: None   Collection Time: 05/14/20  3:40 AM   Specimen: Nasopharyngeal  Result Value Ref Range Status   MRSA by PCR NEGATIVE NEGATIVE Final    Comment:        The GeneXpert MRSA Assay (FDA approved for NASAL specimens only), is one component of a comprehensive MRSA colonization surveillance program. It is not intended to diagnose MRSA infection nor to guide or monitor treatment for MRSA infections. Performed at [facility], [address] [place], [place] [address]    Results for orders placed or performed during the hospital encounter of 05/13/20 (from the past 24 hour(s))  Glucose, capillary     Status: Abnormal   Collection Time: 05/14/20 11:47 AM  Result Value Ref Range   Glucose-Capillary 175 (H) 70 - 99 mg/dL  Glucose, capillary     Status: Abnormal   Collection Time: 05/14/20  5:00 PM  Result Value Ref Range   Glucose-Capillary 153 (H) 70 - 99 mg/dL  Glucose, capillary      Status: Abnormal   Collection Time: 05/14/20  7:56 PM  Result Value Ref Range   Glucose-Capillary 131 (H) 70 - 99 mg/dL   Comment 1 Document in Chart   Glucose, capillary     Status: Abnormal   Collection Time: 05/14/20 11:14 PM  Result Value Ref Range   Glucose-Capillary 130 (H) 70 - 99 mg/dL   Comment 1 Document in Chart   Basic metabolic panel     Status: Abnormal   Collection Time: 05/15/20  5:08 AM  Result Value Ref Range   Sodium 141 135 - 145 mmol/L   Potassium 4.1 3.5 - 5.1 mmol/L   Chloride 108 98 - 111 mmol/L   CO2 25 22 - 32 mmol/L   Glucose, Bld 123 (H) 70 - 99 mg/dL   BUN 34 (H) 8 - 23 mg/dL   Creatinine, Ser 1.09 0.61 - 1.24 mg/dL   Calcium 8.6 (L) 8.9 - 10.3 mg/dL   GFR, Estimated >60 >60 mL/min   Anion gap 8 5 - 15  Magnesium     Status: None   Collection Time: 05/15/20  5:08 AM  Result Value Ref Range   Magnesium 2.0 1.7 - 2.4 mg/dL  Phosphorus     Status: Abnormal   Collection Time: 05/15/20  5:08 AM  Result Value Ref Range   Phosphorus 1.6 (L) 2.5 - 4.6 mg/dL  CBC     Status: Abnormal   Collection Time: 05/15/20  5:08 AM  Result Value Ref Range   WBC 27.6 (H) 4.0 - 10.5 K/uL   RBC 4.04 (L) 4.22 - 5.81 MIL/uL   Hemoglobin 13.2 13.0 - 17.0 g/dL   HCT 39.2 39.0 - 52.0 %   MCV 97.0 80.0 - 100.0 fL   MCH 32.7 26.0 - 34.0 pg   MCHC 33.7 30.0 - 36.0 g/dL   RDW 15.1 11.5 - 15.5 %   Platelets 92 (L) 150 - 400 K/uL   nRBC 0.0 0.0 - 0.2 %  Lactic acid, plasma     Status: Abnormal   Collection Time: 05/15/20  5:08 AM  Result Value Ref Range   Lactic Acid, Venous 2.6 (HH) 0.5 - 1.9 mmol/L  Procalcitonin     Status: None   Collection Time: 05/15/20  5:08 AM  Result Value Ref Range   Procalcitonin 69.16 ng/mL    CC follow up septic shock and resp  failure  HPI Extubated successfully Alert and awake following commands  Best Practice/Protocols:  Diet: NPO Pain/Anxiety/Delirium protocol (if indicated): Fentanyl IVP & propofol drip VAP protocol (if  indicated): Initiated DVT prophylaxis: Enoxaparin sq GI prophylaxis: Protonix IV Glucose control: Monitor every 4, Target range 140-180 Mobility: Bedrest, mobilize as allowed by critical illness Last date of multidisciplinary goals of care discussion: 05/13/20 Family and staff present:Wife and son updated by urology- Dr. Diamantina Providence Summary of discussion: Leave patient intubated with ventilator support status post OR procedure, treating sepsis with ceftriaxone and continue vasopressor support for septic shock Follow up goals of care discussion due: 05/14/20 Code Status: Full Disposition: ICU  Events: 05/13/20 emergently taken to the OR for right ureteral stent placement, postop admitted to the ICU intubated requiring mechanical ventilation and vasopressors 05/14/20 remains on the ventilator, FiO2 requirements down, vasopressor requirements decreased, plan SAT SBT today, extubated 12/13 off pressors alert and awake  Studies: CT ABDOMEN PELVIS WO CONTRAST  Result Date: 05/13/2020 CLINICAL DATA:  Acute nonlocalized abdominal pain. Patient reports lower abdominal pain onset last night. EXAM: CT ABDOMEN AND PELVIS WITHOUT CONTRAST TECHNIQUE: Multidetector CT imaging of the abdomen and pelvis was performed following the standard protocol without IV contrast. COMPARISON:  Remote abdominal CT 03/10/2008 FINDINGS: Lower chest: Normal heart size with pacemaker wires partially included. No acute airspace disease or pleural effusion. Hepatobiliary: Borderline hepatic steatosis. No evidence of focal hepatic lesion on noncontrast exam. Small calcified gallstones within physiologically distended gallbladder. No pericholecystic inflammation. No common bile duct dilatation. Pancreas: No ductal dilatation or inflammation. Spleen: Normal in size without focal abnormality. Adrenals/Urinary Tract: Mild left adrenal thickening. No dominant nodule of either adrenal gland. Obstructing 5 x 7 mm stone at the right  ureterovesicular junction with moderate hydronephrosis and prominent hydroureter. Moderate right perinephric edema. Additional nonobstructing stones in the lower right kidney, largest measures 6 mm. There is no left hydronephrosis. Small nonobstructing stones in the left kidney largest in the lower pole measuring 4 mm. There is no left ureteral calculus. Urinary bladder is partially distended. Stomach/Bowel: Small hiatal hernia. Small amount of fluid in the distal esophagus. The stomach is physiologically distended. There are small duodenal diverticulum without acute inflammation. Normal positioning of the duodenum and ligament of Treitz. Diverticulum at the junction of the duodenum and jejunum without surrounding fat stranding. Fluid-filled small bowel that are nondilated or inflamed. Diminutive normal appendix. Multifocal colonic diverticulosis. Moderate volume of colonic stool. Sigmoid colon is tortuous and courses into the upper abdomen. No diverticulitis or colonic inflammation. Vascular/Lymphatic: Aorto bi-iliac atherosclerosis and tortuosity. No aortic aneurysm. There is no bulky abdominopelvic adenopathy. Reproductive: Enlarged prostate gland spans 5.4 cm and causes mass effect on the bladder base. Other: Fat in both inguinal canals. Minimal free fluid in the right pericolic gutter which is likely reactive. No free air. Tiny fat containing umbilical hernia Musculoskeletal: Multilevel degenerative change in the spine with grade 1 anterolisthesis of L3 on L4 and L4 on L5, likely facet mediated. Degenerative change of both hips. There are no acute or suspicious osseous abnormalities. IMPRESSION: 1. Obstructing 5 x 7 mm stone at the right ureterovesicular junction with moderate hydronephrosis and prominent hydroureter. Moderate right perinephric edema. 2. Additional nonobstructing stones in both kidneys. 3. Colonic diverticulosis without diverticulitis. 4. Cholelithiasis without gallbladder inflammation. 5.  Enlarged prostate gland causing mass effect on the bladder base. Aortic Atherosclerosis (ICD10-I70.0). Electronically Signed   By: Keith Rake M.D.   On: 05/13/2020 18:50   DG Chest 1 View  Result Date: 05/13/2020 CLINICAL DATA:  Chronic cough peer EXAM: CHEST  1 VIEW COMPARISON:  Radiograph 04/20/2015. Included lung bases from abdominal CT earlier today. 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 Improved cardiomegaly from 2016. Unchanged mediastinal contours with aortic atherosclerosis. Chronic interstitial coarsening. No confluent consolidation. No pleural fluid or pneumothorax. No acute osseous abnormalities are seen. Degenerative change in the left shoulder. IMPRESSION: 1. No acute abnormality. 2. Chronic interstitial coarsening. Electronically Signed   By: Keith Rake M.D.   On: 05/13/2020 18:54   DG Abd 1 View  Result Date: 05/14/2020 CLINICAL DATA:  OG tube placement, history of DM, hypertension. EXAM: ABDOMEN - 1 VIEW COMPARISON:  05/13/2020 and prior. FINDINGS: Non weighted enteric tube tip and side hole overlie the gastric body. Gas-filled upper abdominal bowel loops. Partially imaged right ureteral stent. IMPRESSION: Enteric tube tip and side hole overlie the gastric body. Electronically Signed   By: Primitivo Gauze M.D.   On: 05/14/2020 06:32   DG Chest Port 1 View  Result Date: 05/14/2020 CLINICAL DATA:  Encounter for intubation EXAM: PORTABLE CHEST 1 VIEW COMPARISON:  Radiograph 05/13/2020 FINDINGS: Endotracheal tube 4.2 cm from carina. NG tube in stomach. Mild central venous congestion. No pneumothorax. Potential airspace disease in the LEFT lower lobe. IMPRESSION: 1. Endotracheal tube in good position. 2. NG tube in stomach. 3. Potential LEFT lower lobe pneumonia. Electronically Signed   By: Suzy Bouchard M.D.   On: 05/14/2020 07:14   DG OR UROLOGY CYSTO IMAGE ([facility] ONLY)  Result Date: 05/13/2020 There is no interpretation for this exam.  This order is  for images obtained during a surgical procedure.  Please See "Surgeries" Tab for more information regarding the procedure.    Consults: Treatment Team:  Billey Co, MD   Subjective:    Overnight Issues: Sedated on the ventilator, FiO2 and pressor requirements down.  Moving extremities spontaneously.  Plan SAT/SBT today.  Objective:  Vital signs for last 24 hours: Temp:  [97.7 F (36.5 C)-97.9 F (36.6 C)] 97.8 F (36.6 C) (12/12 2000) Pulse Rate:  [65-81] 80 (12/12 2200) Resp:  [15-29] 23 (12/12 2300) BP: (87-118)/(57-83) 96/57 (12/12 2300) SpO2:  [95 %-100 %] 96 % (12/12 2200) FiO2 (%):  [21 %-50 %] 21 % (12/12 1305)  Hemodynamic parameters for last 24 hours:    Intake/Output from previous day: 12/12 0701 - 12/13 0700 In: 2363 [I.V.:1346.1; NG/GT:100; IV Piggyback:916.9] Out: 8144 [Urine:1455]  Intake/Output this shift: No intake/output data recorded.  Vent settings for last 24 hours: Vent Mode: PSV FiO2 (%):  [21 %-50 %] 21 % Set Rate:  [18 bmp] 18 bmp Vt Set:  [500 mL] 500 mL PEEP:  [5 cmH20] 5 cmH20 Pressure Support:  [8 cmH20] 8 cmH20 Plateau Pressure:  [20 cmH20] 20 cmH20   Review of Systems:  Gen:  Denies  fever, sweats, chills weight loss  HEENT: Denies blurred vision, double vision, ear pain, eye pain, hearing loss, nose bleeds, sore throat Cardiac:  No dizziness, chest pain or heaviness, chest tightness,edema, No JVD Resp:   No cough, -sputum production, -shortness of breath,-wheezing, -hemoptysis,  Other:  All other systems negative     Physical Examination:   General Appearance: No distress  Neuro:without focal findings,  speech normal,  HEENT: PERRLA, EOM intact.   Pulmonary: normal breath sounds, No wheezing.  CardiovascularNormal S1,S2.  No m/r/g.   Abdomen: Benign, Soft, non-tender. Renal:  No costovertebral tenderness  GU:  Not performed at this time. Endoc: No evident  thyromegaly Skin:   warm, no rashes, no ecchymosis   Extremities: normal, no cyanosis, clubbing. PSYCHIATRIC: Mood, affect within normal limits.   ALL OTHER ROS ARE NEGATIVE   Assessment/Plan:  Severe sepsis with septic shock secondary to obstructing right ureteral stone -Resolving  Status post right ureteral cystoscopy, laser lithotripsy and right ureteral stent placement.  Aggressively IVF resuscitated in the ED- 3 L LR bolus.  Lactic was 4 on arrival.  off pressors - Cultures show Klebsiella oxytoca - IV antibiotics: Ceftriaxone - Strict I/O's: alert provider if UOP < 0.5 mL/kg/hr  Acute postoperative hypoxic respiratory failure  Resolved  Acute kidney injury secondary to obstructing right ureteral stone Kidney stones BPH -continue Foley Catheter-assess need -Avoid nephrotoxic agents -Follow urine output, BMP -Ensure adequate renal perfusion, optimize oxygenation -Renal dose medications   Type 2 diabetes mellitus -Every 4 CBG monitoring -Patient NPO currently -Initiate SSI as needed -Follow ICU hypo/hyperglycemia protocol  Transfer to gen  Med floor  [facility] to be contacted  Corrin Parker, M.D.  [facility]  Medical Director [place] Director [place] Department

## 2020-05-15 NOTE — Progress Notes (Signed)
PHARMACY CONSULT NOTE - FOLLOW UP  Pharmacy Consult for Electrolyte Monitoring and Replacement   Recent Labs: Potassium (mmol/L)  Date Value  05/15/2020 4.1   Magnesium (mg/dL)  Date Value  05/15/2020 2.0   Calcium (mg/dL)  Date Value  05/15/2020 8.6 (L)   Albumin (g/dL)  Date Value  05/14/2020 2.9 (L)  02/24/2020 3.9   Phosphorus (mg/dL)  Date Value  05/15/2020 1.6 (L)   Sodium (mmol/L)  Date Value  05/15/2020 141  02/24/2020 138     Assessment: 85 year old male with K.oxytoca bacteremia. Patient is POD 2 right ureteral stent placement for management of a distal right ureteral stone. Patient is on ceftriaxone pending susceptibilities. Pharmacy to replace electrolytes.  Goal of Therapy:  Electrolytes WNL  Plan:  Phosphorus low today. Replace with PHOS NAK 2 packets x 2 doses. Expect phos to improve as diet improves. Will follow up with morning labs.  Tawnya Crook ,PharmD Clinical Pharmacist 05/15/2020 9:37 AM

## 2020-05-15 NOTE — Consult Note (Addendum)
NAME: Todd Ortega  DOB: 1934/12/31  MRN: [id number]  Date/Time: 05/15/2020 1:22 PM  REQUESTING PROVIDER: Dr. Mortimer Fries Subjective:  REASON FOR CONSULT: Bacteremia ? Todd Ortega is a 85 y.o. with a history of diabetes mellitus, coronary artery disease, hyperlipidemia, hypertension, nephrolithiasis with history of laser lithotripsy and basket extraction of bilateral ureteric stones in 2019 and as well as a right stone, CHB, s/p pacemaker, Presented to the ER on 05/13/2020 with lower abdominal pain.  He also had body aches and chills for the past few days.  In the ED was found to have a temperature of 101, heart rate of 90, respiratory rate of 18 blood pressure of 173/64 oxygen saturation 100% on room air. Labs included sodium of 136, potassium 3.6, BUN 35, creatinine 2.22, AST 56, ALT 37 total bili of 1.4, WBC 14.9, lactate of 4, hemoglobin 14.6 and platelet 144.  UA showed large leukocytes, nitrites negative. CT abdomen showed an obstructing 5 x 7 mm stone at the right ureterovesicular junction with moderate hydronephrosis and prominent hydroureter.  There was moderate right perinephric edema.  Enlarged prostate gland causing mass-effect on the bladder base was also seen.  Blood cultures were sent and he was started on IV ceftriaxone.  The same day he underwent cystoscopy, laser lithotripsy of the right distal ureteral stone and right ureteral stent placement by urologist. Blood culture and urine culture came back positive for Klebsiella oxytoca.  I am seeing the patient for the same. Past Medical History:  Diagnosis Date  . Arthritis   . BPH (benign prostatic hypertrophy)   . Chronic kidney disease   . Complete heart block ([place])   . Coronary artery disease   . Diabetes mellitus without complication ([place])   . Foot drop, right foot   . Hyperlipidemia   . Hypertension   . Hyperthyroidism   . Nephrolithiasis   . Neuropathy   . Shortness of breath dyspnea   . Sleep apnea     Past Surgical History:   Procedure Laterality Date  . CARDIAC CATHETERIZATION    . CYSTOSCOPY WITH STENT PLACEMENT Right 05/13/2020   Procedure: CYSTOSCOPY RETROGRADE LASER  WITH STENT PLACEMENT RIGHT;  Surgeon: Billey Co, MD;  Location: [facility] ORS;  Service: Urology;  Laterality: Right;  . HERNIA REPAIR Bilateral    Inguinal Hernia Repair  . MASS EXCISION     removed from left hand  . PACEMAKER INSERTION N/A 04/20/2015   Procedure: INSERTION PACEMAKER;  Surgeon: Isaias Cowman, MD;  Location: [facility] ORS;  Service: Cardiovascular;  Laterality: N/A;    Social History   Socioeconomic History  . Marital status: Married    Spouse name: Not on file  . Number of children: 2  . Years of education: Not on file  . Highest education level: Some college, no degree  Occupational History  . Occupation: retired  . Occupation: Tax inspector    Comment: part time  Tobacco Use  . Smoking status: Never Smoker  . Smokeless tobacco: Never Used  Vaping Use  . Vaping Use: Never used  Substance and Sexual Activity  . Alcohol use: No    Alcohol/week: 0.0 standard drinks  . Drug use: No  . Sexual activity: Not on file  Other Topics Concern  . Not on file  Social History Narrative  . Not on file   Social Determinants of Health   Financial Resource Strain: Low Risk   . Difficulty of Paying Living Expenses: Not hard at all  Food Insecurity: No Food  Insecurity  . Worried About Charity fundraiser in the Last Year: Never true  . Ran Out of Food in the Last Year: Never true  Transportation Needs: No Transportation Needs  . Lack of Transportation (Medical): No  . Lack of Transportation (Non-Medical): No  Physical Activity: Inactive  . Days of Exercise per Week: 0 days  . Minutes of Exercise per Session: 0 min  Stress: No Stress Concern Present  . Feeling of Stress : Not at all  Social Connections: Socially Integrated  . Frequency of Communication with Friends and Family: More than three times a week  .  Frequency of Social Gatherings with Friends and Family: More than three times a week  . Attends Religious Services: More than 4 times per year  . Active Member of Clubs or Organizations: Yes  . Attends Archivist Meetings: More than 4 times per year  . Marital Status: Married  Human resources officer Violence: Not At Risk  . Fear of Current or Ex-Partner: No  . Emotionally Abused: No  . Physically Abused: No  . Sexually Abused: No    Family History  Problem Relation Age of Onset  . Heart attack Mother   . Gout Mother   . Diabetes Mother   . Hypertension Mother   . Hyperlipidemia Mother   . Lung cancer Father   . Drug abuse Brother    Allergies  Allergen Reactions  . Venlafaxine     Other reaction(s): Other (See Comments) Sweating, stomach upset, constipation    ? Current Facility-Administered Medications  Medication Dose Route Frequency Provider Last Rate Last Admin  . 0.9 %  sodium chloride infusion   Intravenous Continuous Rust-Chester, Huel Cote, NP   Stopped at 05/15/20 0610  . 0.9 %  sodium chloride infusion  250 mL Intravenous Continuous Rust-Chester, Huel Cote, NP   Held at 05/14/20 0135  . acetaminophen (TYLENOL) tablet 650 mg  650 mg Oral Q6H PRN Rosine Door, MD       Or  . acetaminophen (TYLENOL) suppository 650 mg  650 mg Rectal Q6H PRN Rosine Door, MD      . albuterol (PROVENTIL) (2.5 MG/3ML) 0.083% nebulizer solution 2.5 mg  2.5 mg Nebulization Q4H PRN Rosine Door, MD      . aspirin chewable tablet 81 mg  81 mg Oral Daily Rosine Door, MD   81 mg at 05/15/20 0947  . cefTRIAXone (ROCEPHIN) 2 g in sodium chloride 0.9 % 100 mL IVPB  2 g Intravenous Q24H Lang Snow, NP 200 mL/hr at 05/15/20 4944 Infusion Verify at 05/15/20 9675  . docusate (COLACE) 50 MG/5ML liquid 100 mg  100 mg Oral BID Rosine Door, MD      . enoxaparin (LOVENOX) injection 40 mg  40 mg Subcutaneous Q24H Rosine Door, MD      . finasteride (PROSCAR) tablet 5 mg  5 mg  Oral QHS Shawna Clamp, MD   5 mg at 05/14/20 2115  . gabapentin (NEURONTIN) capsule 300 mg  300 mg Oral Daily Rosine Door, MD   300 mg at 05/15/20 0954  . hydrALAZINE (APRESOLINE) injection 5 mg  5 mg Intravenous Q8H PRN Shawna Clamp, MD      . Derrill Memo ON 05/16/2020] levothyroxine (SYNTHROID) tablet 175 mcg  175 mcg Oral QAC breakfast Rosine Door, MD      . MEDLINE mouth rinse  15 mL Mouth Rinse BID Tyler Pita, MD   15 mL at 05/15/20 0947  . ondansetron (ZOFRAN) tablet 4  mg  4 mg Oral Q6H PRN Rosine Door, MD       Or  . ondansetron [facility]) injection 4 mg  4 mg Intravenous Q6H PRN Rosine Door, MD      . pantoprazole (PROTONIX) EC tablet 40 mg  40 mg Oral Daily Flora Lipps, MD   40 mg at 05/15/20 0948  . polyethylene glycol (MIRALAX / GLYCOLAX) packet 17 g  17 g Oral Daily Rosine Door, MD      . potassium & sodium phosphates (PHOS-NAK) 280-160-250 MG packet 2 packet  2 packet Oral Q4H Rosine Door, MD      . simvastatin (ZOCOR) tablet 40 mg  40 mg Oral Daily Rosine Door, MD   40 mg at 05/15/20 0949  . tamsulosin (FLOMAX) capsule 0.4 mg  0.4 mg Oral QPC breakfast Shawna Clamp, MD   0.4 mg at 05/15/20 9242     Abtx:  Anti-infectives (From admission, onward)   Start     Dose/Rate Route Frequency Ordered Stop   05/14/20 0700  cefTRIAXone (ROCEPHIN) 2 g in sodium chloride 0.9 % 100 mL IVPB        2 g 200 mL/hr over 30 Minutes Intravenous Every 24 hours 05/14/20 0451     05/13/20 2015  cefTRIAXone (ROCEPHIN) 1 g in sodium chloride 0.9 % 100 mL IVPB  Status:  Discontinued        1 g 200 mL/hr over 30 Minutes Intravenous Every 24 hours 05/13/20 2010 05/14/20 0450   05/13/20 1915  cefTRIAXone (ROCEPHIN) 1 g in sodium chloride 0.9 % 100 mL IVPB        1 g 200 mL/hr over 30 Minutes Intravenous  Once 05/13/20 1907 05/13/20 2011      REVIEW OF SYSTEMS:  Const: fever,  chills, negative weight loss Eyes: negative diplopia or visual changes, negative eye pain ENT:  negative coryza, negative sore throat Resp: negative cough, hemoptysis, dyspnea Cards: negative for chest pain, palpitations, lower extremity edema GU: negative for frequency, dysuria and hematuria GI: abdominal pain, flank pain Skin: negative for rash and pruritus Heme: negative for easy bruising and gum/nose bleeding MS: negative for myalgias, arthralgias, back pain and muscle weakness Neurolo:negative for headaches, dizziness, vertigo, memory problems  Psych: negative for feelings of anxiety, depression  Endocrine: negative for thyroid, diabetes Allergy/Immunology-as above Objective:  VITALS:  BP 108/72   Pulse 87   Temp 97.8 F (36.6 C) (Oral)   Resp (!) 25   Ht 5\' 6"  (1.676 m)   Wt 72.6 kg   SpO2 93%   BMI 25.82 kg/m  PHYSICAL EXAM:  General: Alert, cooperative, no distress, appears stated age.  Head: Normocephalic, without obvious abnormality, atraumatic. Eyes: Conjunctivae clear, anicteric sclerae. Pupils are equal ENT Nares normal. No drainage or sinus tenderness. Lips, mucosa, and tongue normal. No Thrush Neck: Supple, symmetrical, no adenopathy, thyroid: non tender no carotid bruit and no JVD. Back: No CVA tenderness. Lungs: Clear to auscultation bilaterally. No Wheezing or Rhonchi. No rales. Heart: Regular rate and rhythm, no murmur, rub or gallop. Pacemaker site fine Abdomen: Soft, non-tender,not distended. Bowel sounds normal. No masses, foley present Extremities: atraumatic, no cyanosis. No edema. No clubbing Skin: No rashes or lesions. Or bruising Lymph: Cervical, supraclavicular normal. Neurologic: Grossly non-focal Pertinent Labs Lab Results CBC    Component Value Date/Time   WBC 27.6 (H) 05/15/2020 0508   RBC 4.04 (L) 05/15/2020 0508   HGB 13.2 05/15/2020 0508   HGB 14.1 02/24/2020 0956   HCT 39.2  05/15/2020 0508   HCT 42.8 02/24/2020 0956   PLT 92 (L) 05/15/2020 0508   PLT 211 02/24/2020 0956   MCV 97.0 05/15/2020 0508   MCV 93 02/24/2020 0956    MCH 32.7 05/15/2020 0508   MCHC 33.7 05/15/2020 0508   RDW 15.1 05/15/2020 0508   RDW 12.9 02/24/2020 0956   LYMPHSABS 2.3 02/24/2020 0956   MONOABS 0.6 04/19/2015 1129   EOSABS 0.1 02/24/2020 0956   BASOSABS 0.0 02/24/2020 0956    CMP Latest Ref Rng & Units 05/15/2020 05/14/2020 05/13/2020  Glucose 70 - 99 mg/dL 123(H) 156(H) 124(H)  BUN 8 - 23 mg/dL 34(H) 37(H) 35(H)  Creatinine 0.61 - 1.24 mg/dL 1.09 1.69(H) 2.22(H)  Sodium 135 - 145 mmol/L 141 137 136  Potassium 3.5 - 5.1 mmol/L 4.1 4.2 3.6  Chloride 98 - 111 mmol/L 108 105 101  CO2 22 - 32 mmol/L 25 21(L) 22  Calcium 8.9 - 10.3 mg/dL 8.6(L) 8.7(L) 9.8  Total Protein 6.5 - 8.1 g/dL - 6.7 7.8  Total Bilirubin 0.3 - 1.2 mg/dL - 0.9 1.4(H)  Alkaline Phos 38 - 126 U/L - 63 73  AST 15 - 41 U/L - 67(H) 56(H)  ALT 0 - 44 U/L - 35 37      Microbiology: Recent Results (from the past 240 hour(s))  Urine Culture     Status: Abnormal (Preliminary result)   Collection Time: 05/13/20  7:06 PM   Specimen: Urine, Clean Catch  Result Value Ref Range Status   Specimen Description   Final    URINE, CLEAN CATCH Performed at [facility], [address], [place], [place] [address]    Special Requests   Final    NONE Performed at [facility], [address], [place], [place] [address]    Culture (A)  Final    >=100,000 COLONIES/mL Lonell Grandchild NEGATIVE RODS SUSCEPTIBILITIES TO FOLLOW Performed at [facility], [place] [address] [place], [place] [address]    Report Status PENDING  Incomplete  Blood culture (routine x 2)     Status: Abnormal (Preliminary result)   Collection Time: 05/13/20  7:08 PM   Specimen: BLOOD RIGHT WRIST  Result Value Ref Range Status   Specimen Description   Final    BLOOD RIGHT WRIST Performed at [facility], [address] [place] [address], [place], [place] [address]    Special Requests   Final    BOTTLES DRAWN AEROBIC AND ANAEROBIC Blood Culture results may not be optimal due to an  excessive volume of blood received in culture bottles Performed at [facility], [address] [place], [place] [address]    Culture  Setup Time   Final    Organism ID to follow [place] AND ANAEROBIC BOTTLES CRITICAL RESULT CALLED TO, READ BACK BY AND VERIFIED WITH: LISA KLUTZ ON 05/14/20 AT 0421 QSD Performed at [facility], [address], [place], [place] [address]    Culture (A)  Final    KLEBSIELLA OXYTOCA SUSCEPTIBILITIES TO FOLLOW Performed at [facility], [place] [address], [place], [place] [address]    Report Status PENDING  Incomplete  Blood Culture ID Panel (Reflexed)     Status: Abnormal   Collection Time: 05/13/20  7:08 PM  Result Value Ref Range Status   Enterococcus faecalis NOT DETECTED NOT DETECTED Final   Enterococcus Faecium NOT DETECTED NOT DETECTED Final   Listeria monocytogenes NOT DETECTED NOT DETECTED Final   Staphylococcus species  NOT DETECTED NOT DETECTED Final   Staphylococcus aureus (BCID) NOT DETECTED NOT DETECTED Final   Staphylococcus epidermidis NOT DETECTED NOT DETECTED Final   Staphylococcus lugdunensis NOT DETECTED NOT DETECTED Final   Streptococcus species NOT DETECTED NOT DETECTED Final   Streptococcus agalactiae NOT DETECTED NOT DETECTED Final   Streptococcus pneumoniae NOT DETECTED NOT DETECTED Final   Streptococcus pyogenes NOT DETECTED NOT DETECTED Final   A.calcoaceticus-baumannii NOT DETECTED NOT DETECTED Final   Bacteroides fragilis NOT DETECTED NOT DETECTED Final   Enterobacterales DETECTED (A) NOT DETECTED Final    Comment: Enterobacterales represent a large order of gram negative bacteria, not a single organism. CRITICAL RESULT CALLED TO, READ BACK BY AND VERIFIED WITH: LISA KLUTZ ON 05/14/20 AT 0421 QSD    Enterobacter cloacae complex NOT DETECTED NOT DETECTED Final   Escherichia coli NOT DETECTED NOT DETECTED Final   Klebsiella aerogenes NOT DETECTED NOT DETECTED Final    Klebsiella oxytoca DETECTED (A) NOT DETECTED Final    Comment: CRITICAL RESULT CALLED TO, READ BACK BY AND VERIFIED WITH: LISA KLUTZ ON 05/14/20 AT 0421 QSD    Klebsiella pneumoniae NOT DETECTED NOT DETECTED Final   Proteus species NOT DETECTED NOT DETECTED Final   Salmonella species NOT DETECTED NOT DETECTED Final   Serratia marcescens NOT DETECTED NOT DETECTED Final   Haemophilus influenzae NOT DETECTED NOT DETECTED Final   Neisseria meningitidis NOT DETECTED NOT DETECTED Final   Pseudomonas aeruginosa NOT DETECTED NOT DETECTED Final   Stenotrophomonas maltophilia NOT DETECTED NOT DETECTED Final   Candida albicans NOT DETECTED NOT DETECTED Final   Candida auris NOT DETECTED NOT DETECTED Final   Candida glabrata NOT DETECTED NOT DETECTED Final   Candida krusei NOT DETECTED NOT DETECTED Final   Candida parapsilosis NOT DETECTED NOT DETECTED Final   Candida tropicalis NOT DETECTED NOT DETECTED Final   Cryptococcus neoformans/gattii NOT DETECTED NOT DETECTED Final   CTX-M ESBL NOT DETECTED NOT DETECTED Final   Carbapenem resistance IMP NOT DETECTED NOT DETECTED Final   Carbapenem resistance KPC NOT DETECTED NOT DETECTED Final   Carbapenem resistance NDM NOT DETECTED NOT DETECTED Final   Carbapenem resist OXA 48 LIKE NOT DETECTED NOT DETECTED Final   Carbapenem resistance VIM NOT DETECTED NOT DETECTED Final    Comment: Performed at [facility], [address] [place], [place] [address]  Resp Panel by RT-PCR (Flu A&B, Covid) Nasopharyngeal Swab     Status: None   Collection Time: 05/13/20  7:12 PM   Specimen: Nasopharyngeal Swab; Nasopharyngeal(NP) swabs in vial transport medium  Result Value Ref Range Status   SARS Coronavirus 2 by RT PCR NEGATIVE NEGATIVE Final    Comment: (NOTE) SARS-CoV-2 target nucleic acids are NOT DETECTED.  The SARS-CoV-2 RNA is generally detectable in upper respiratory specimens during the acute phase of infection. The lowest concentration of  SARS-CoV-2 viral copies this assay can detect is 138 copies/mL. A negative result does not preclude SARS-Cov-2 infection and should not be used as the sole basis for treatment or other patient management decisions. A negative result may occur with  improper specimen collection/handling, submission of specimen other than nasopharyngeal swab, presence of viral mutation(s) within the areas targeted by this assay, and inadequate number of viral copies(<138 copies/mL). A negative result must be combined with clinical observations, patient history, and epidemiological information. The expected result is Negative.  Fact Sheet for Patients:  EntrepreneurPulse.com.au  Fact Sheet for Healthcare Providers:  IncredibleEmployment.be  This test is no t yet approved or cleared  by the [place] and  has been authorized for detection and/or diagnosis of SARS-CoV-2 by FDA under an Emergency Use Authorization (EUA). This EUA will remain  in effect (meaning this test can be used) for the duration of the COVID-19 declaration under Section 564(b)(1) of the Act, 21 U.S.C.section 360bbb-3(b)(1), unless the authorization is terminated  or revoked sooner.       Influenza A by PCR NEGATIVE NEGATIVE Final   Influenza B by PCR NEGATIVE NEGATIVE Final    Comment: (NOTE) The Xpert Xpress SARS-CoV-2/FLU/RSV plus assay is intended as an aid in the diagnosis of influenza from Nasopharyngeal swab specimens and should not be used as a sole basis for treatment. Nasal washings and aspirates are unacceptable for Xpert Xpress SARS-CoV-2/FLU/RSV testing.  Fact Sheet for Patients: EntrepreneurPulse.com.au  Fact Sheet for Healthcare Providers: IncredibleEmployment.be  This test is not yet approved or cleared by the [place] FDA and has been authorized for detection and/or diagnosis of SARS-CoV-2 by FDA under an Emergency Use  Authorization (EUA). This EUA will remain in effect (meaning this test can be used) for the duration of the COVID-19 declaration under Section 564(b)(1) of the Act, 21 U.S.C. section 360bbb-3(b)(1), unless the authorization is terminated or revoked.  Performed at [facility], [address] [place], [place] [address]   Blood culture (routine x 2)     Status: None (Preliminary result)   Collection Time: 05/13/20  7:13 PM   Specimen: Left Antecubital; Blood  Result Value Ref Range Status   Specimen Description LEFT ANTECUBITAL  Final   Special Requests   Final    BOTTLES DRAWN AEROBIC AND ANAEROBIC Blood Culture results may not be optimal due to an inadequate volume of blood received in culture bottles   Culture  Setup Time   Final    GRAM NEGATIVE RODS IN BOTH AEROBIC AND ANAEROBIC BOTTLES CRITICAL VALUE NOTED.  VALUE IS CONSISTENT WITH PREVIOUSLY REPORTED AND CALLED VALUE. Performed at [facility], [address] [place], [place] [address]    Culture GRAM NEGATIVE RODS  Final   Report Status PENDING  Incomplete  MRSA PCR Screening     Status: None   Collection Time: 05/14/20  3:40 AM   Specimen: Nasopharyngeal  Result Value Ref Range Status   MRSA by PCR NEGATIVE NEGATIVE Final    Comment:        The GeneXpert MRSA Assay (FDA approved for NASAL specimens only), is one component of a comprehensive MRSA colonization surveillance program. It is not intended to diagnose MRSA infection nor to guide or monitor treatment for MRSA infections. Performed at [facility], [address] [place],  [address]     IMAGING RESULTS:  I have personally reviewed the films ? Impression/Recommendation ? ?Klebsiella oxytoca bacteremia secondary to complicated urinary tract infection with right obstructive ureteric stone causing hydronephrosis and hydroureter.  Underwent cystoscopy, laser lithotripsy and stone removal and stent placement on  07/15/2019.  Currently on ceftriaxone and will need a total for 14 days.  Susceptibility still pending.  AKI secondary to infection and complicated UTI has improved. Leukocytosis still persisting slight decline We will need to do a renal ultrasound to see whether the hydronephrosis has resolved if leucocytosis does not resolve tomorrow. Thrombocytopenia secondary to infection.  CHB- has pacemaker  BPH on finasteride and tamsulosin.  ?PT has foley and Dr.Sninsky to decide on removal as it was placed by urology during the procedure ___________________________________________________ Discussed with patient,and his wife and  care team  Note:  This document was prepared using Dragon voice recognition software and may include unintentional dictation errors.

## 2020-05-15 NOTE — Progress Notes (Signed)
Urology Inpatient Progress Note  Subjective: No acute events overnight. He has been extubated, no longer requiring pressor support. He is afebrile, BP uptrending with intermittent hypotension. Lactate down today, 2.6. WBC count down today, 27.6. Creatinine down today, 1.09. Urine culture pending. Blood culture pending with Klebsiella oxytoca. On antibiotics as below. Foley catheter in place draining clear, yellow urine. Patient reports feeling well today. He is tolerating p.o. well. He denies flank or bladder pain.  Anti-infectives: Anti-infectives (From admission, onward)   Start     Dose/Rate Route Frequency Ordered Stop   05/14/20 0700  cefTRIAXone (ROCEPHIN) 2 g in sodium chloride 0.9 % 100 mL IVPB        2 g 200 mL/hr over 30 Minutes Intravenous Every 24 hours 05/14/20 0451     05/13/20 2015  cefTRIAXone (ROCEPHIN) 1 g in sodium chloride 0.9 % 100 mL IVPB  Status:  Discontinued        1 g 200 mL/hr over 30 Minutes Intravenous Every 24 hours 05/13/20 2010 05/14/20 0450   05/13/20 1915  cefTRIAXone (ROCEPHIN) 1 g in sodium chloride 0.9 % 100 mL IVPB        1 g 200 mL/hr over 30 Minutes Intravenous  Once 05/13/20 1907 05/13/20 2011      Current Facility-Administered Medications  Medication Dose Route Frequency Provider Last Rate Last Admin  . 0.9 %  sodium chloride infusion   Intravenous Continuous Rust-Chester, Huel Cote, NP   Stopped at 05/15/20 0610  . 0.9 %  sodium chloride infusion  250 mL Intravenous Continuous Rust-Chester, Huel Cote, NP   Held at 05/14/20 0135  . acetaminophen (TYLENOL) tablet 650 mg  650 mg Per Tube Q6H PRN Rosine Door, MD       Or  . acetaminophen (TYLENOL) suppository 650 mg  650 mg Rectal Q6H PRN Rosine Door, MD      . albuterol (PROVENTIL) (2.5 MG/3ML) 0.083% nebulizer solution 2.5 mg  2.5 mg Nebulization Q4H PRN Rosine Door, MD      . aspirin chewable tablet 81 mg  81 mg Per Tube Daily Rosine Door, MD      . cefTRIAXone (ROCEPHIN) 2 g in  sodium chloride 0.9 % 100 mL IVPB  2 g Intravenous Q24H Lang Snow, NP 200 mL/hr at 05/15/20 7654 Infusion Verify at 05/15/20 6503  . Chlorhexidine Gluconate Cloth 2 % PADS 6 each  6 each Topical Q0600 Shawna Clamp, MD   6 each at 05/15/20 0622  . docusate (COLACE) 50 MG/5ML liquid 100 mg  100 mg Per Tube BID Rust-Chester, Britton L, NP   100 mg at 05/14/20 2114  . enoxaparin (LOVENOX) injection 30 mg  30 mg Subcutaneous Q24H Rowland Lathe, RPH   30 mg at 05/14/20 2115  . fentaNYL (SUBLIMAZE) injection 25 mcg  25 mcg Intravenous Q15 min PRN Rust-Chester, Britton L, NP   25 mcg at 05/13/20 2344  . fentaNYL (SUBLIMAZE) injection 25-100 mcg  25-100 mcg Intravenous Q30 min PRN Rust-Chester, Britton L, NP   100 mcg at 05/14/20 0254  . finasteride (PROSCAR) tablet 5 mg  5 mg Oral QHS Shawna Clamp, MD   5 mg at 05/14/20 2115  . gabapentin (NEURONTIN) 250 MG/5ML solution 300 mg  300 mg Per Tube Daily Rosine Door, MD   300 mg at 05/14/20 0809  . hydrALAZINE (APRESOLINE) injection 5 mg  5 mg Intravenous Q8H PRN Shawna Clamp, MD      . lactated ringers bolus 250 mL  250  mL Intravenous Once Shawna Clamp, MD      . levothyroxine (SYNTHROID) tablet 175 mcg  175 mcg Per Tube QAC breakfast Rosine Door, MD   175 mcg at 05/15/20 0604  . MEDLINE mouth rinse  15 mL Mouth Rinse BID Tyler Pita, MD   15 mL at 05/14/20 2115  . norepinephrine (LEVOPHED) 4mg  in 277mL premix infusion  0-10 mcg/min Intravenous Titrated Rust-Chester, Huel Cote, NP   Stopped at 05/15/20 0437  . ondansetron (ZOFRAN) tablet 4 mg  4 mg Per Tube Q6H PRN Rosine Door, MD       Or  . ondansetron [facility]) injection 4 mg  4 mg Intravenous Q6H PRN Rosine Door, MD      . pantoprazole (PROTONIX) injection 40 mg  40 mg Intravenous Q24H Rust-Chester, Britton L, NP   40 mg at 05/14/20 1236  . polyethylene glycol (MIRALAX / GLYCOLAX) packet 17 g  17 g Per Tube Daily Rust-Chester, Britton L, NP   17 g at 05/14/20 0804   . simvastatin (ZOCOR) tablet 40 mg  40 mg Per Tube Daily Rosine Door, MD   40 mg at 05/14/20 0804  . tamsulosin (FLOMAX) capsule 0.4 mg  0.4 mg Oral QPC breakfast Shawna Clamp, MD   0.4 mg at 05/14/20 0801   Objective: Vital signs in last 24 hours: Temp:  [97.7 F (36.5 C)-97.8 F (36.6 C)] 97.8 F (36.6 C) (12/13 0800) Pulse Rate:  [65-87] 87 (12/13 0800) Resp:  [15-29] 25 (12/13 0800) BP: (87-114)/(57-83) 108/72 (12/13 0800) SpO2:  [93 %-100 %] 93 % (12/13 0800) FiO2 (%):  [21 %-30 %] 21 % (12/12 1305)  Intake/Output from previous day: 12/12 0701 - 12/13 0700 In: 2363 [I.V.:1346.1; NG/GT:100; IV Piggyback:916.9] Out: 2836 [Urine:1455] Intake/Output this shift: No intake/output data recorded.  Physical Exam Vitals and nursing note reviewed.  Constitutional:      General: He is not in acute distress.    Appearance: He is not ill-appearing, toxic-appearing or diaphoretic.  HENT:     Head: Normocephalic and atraumatic.  Pulmonary:     Effort: Pulmonary effort is normal. No respiratory distress.  Skin:    General: Skin is warm and dry.  Neurological:     Mental Status: He is alert and oriented to person, place, and time.  Psychiatric:        Mood and Affect: Mood normal.        Behavior: Behavior normal.    Lab Results:  Recent Labs    05/14/20 0630 05/15/20 0508  WBC 32.3* 27.6*  HGB 13.9 13.2  HCT 42.2 39.2  PLT 118* 92*   BMET Recent Labs    05/14/20 0630 05/15/20 0508  NA 137 141  K 4.2 4.1  CL 105 108  CO2 21* 25  GLUCOSE 156* 123*  BUN 37* 34*  CREATININE 1.69* 1.09  CALCIUM 8.7* 8.6*   PT/INR Recent Labs    05/14/20 0630  LABPROT 15.2  INR 1.3*   ABG Recent Labs    05/13/20 2359  PHART 7.35  HCO3 21.0   Assessment & Plan: 85 year old male with a history of CAD and pacemaker now POD 2 from right ureteral stent placement for management of a 7 mm distal right ureteral stone with urosepsis. Patient clinically improving today, no  longer requiring intubation or pressor support. He is tolerating stent and Foley well with no irritative symptoms.  We discussed his treatment plan moving forward. I explained that he will require outpatient ureteroscopy with laser  lithotripsy and stent exchange in 3 to 4 weeks after completing outpatient antibiotics. Patient is in agreement with this plan.  Recommendations: -Okay to discontinue Foley today. -Continue broad-spectrum antibiotics and narrow per culture results. He'll require at least 14 days of culture appropriate antibiotics. -He'll require outpatient ureteroscopy with laser lithotripsy and stent exchange in 3 to 4 weeks.  Debroah Loop, PA-C 05/15/2020

## 2020-05-16 ENCOUNTER — Inpatient Hospital Stay: Payer: Medicare HMO

## 2020-05-16 DIAGNOSIS — N132 Hydronephrosis with renal and ureteral calculous obstruction: Secondary | ICD-10-CM

## 2020-05-16 DIAGNOSIS — R6521 Severe sepsis with septic shock: Secondary | ICD-10-CM

## 2020-05-16 DIAGNOSIS — A4159 Other Gram-negative sepsis: Secondary | ICD-10-CM

## 2020-05-16 DIAGNOSIS — D696 Thrombocytopenia, unspecified: Secondary | ICD-10-CM

## 2020-05-16 DIAGNOSIS — E785 Hyperlipidemia, unspecified: Secondary | ICD-10-CM

## 2020-05-16 LAB — CBC
HCT: 39 % (ref 39.0–52.0)
Hemoglobin: 12.9 g/dL — ABNORMAL LOW (ref 13.0–17.0)
MCH: 31.5 pg (ref 26.0–34.0)
MCHC: 33.1 g/dL (ref 30.0–36.0)
MCV: 95.1 fL (ref 80.0–100.0)
Platelets: 103 10*3/uL — ABNORMAL LOW (ref 150–400)
RBC: 4.1 MIL/uL — ABNORMAL LOW (ref 4.22–5.81)
RDW: 14.5 % (ref 11.5–15.5)
WBC: 18.9 10*3/uL — ABNORMAL HIGH (ref 4.0–10.5)
nRBC: 0 % (ref 0.0–0.2)

## 2020-05-16 LAB — CULTURE, BLOOD (ROUTINE X 2)

## 2020-05-16 LAB — GLUCOSE, CAPILLARY
Glucose-Capillary: 116 mg/dL — ABNORMAL HIGH (ref 70–99)
Glucose-Capillary: 129 mg/dL — ABNORMAL HIGH (ref 70–99)
Glucose-Capillary: 146 mg/dL — ABNORMAL HIGH (ref 70–99)
Glucose-Capillary: 164 mg/dL — ABNORMAL HIGH (ref 70–99)
Glucose-Capillary: 171 mg/dL — ABNORMAL HIGH (ref 70–99)
Glucose-Capillary: 98 mg/dL (ref 70–99)

## 2020-05-16 LAB — PHOSPHORUS: Phosphorus: 2.1 mg/dL — ABNORMAL LOW (ref 2.5–4.6)

## 2020-05-16 LAB — BASIC METABOLIC PANEL
Anion gap: 8 (ref 5–15)
BUN: 28 mg/dL — ABNORMAL HIGH (ref 8–23)
CO2: 25 mmol/L (ref 22–32)
Calcium: 8.7 mg/dL — ABNORMAL LOW (ref 8.9–10.3)
Chloride: 105 mmol/L (ref 98–111)
Creatinine, Ser: 0.96 mg/dL (ref 0.61–1.24)
GFR, Estimated: >60 mL/min (ref >60)
Glucose, Bld: 123 mg/dL — ABNORMAL HIGH (ref 70–99)
Potassium: 3.6 mmol/L (ref 3.5–5.1)
Sodium: 138 mmol/L (ref 135–145)

## 2020-05-16 LAB — PROCALCITONIN: Procalcitonin: 31.96 ng/mL

## 2020-05-16 LAB — URINE CULTURE: Culture: >=100000 — AB

## 2020-05-16 MED ORDER — POTASSIUM & SODIUM PHOSPHATES 280-160-250 MG PO PACK
2.0000 | PACK | ORAL | Status: AC
  Administered 2020-05-16 (×2): 2 via ORAL
  Filled 2020-05-16 (×2): qty 2

## 2020-05-16 NOTE — Progress Notes (Signed)
Patient ID: Todd Ortega, male   DOB: 11-18-1934, 85 y.o.   MRN: [id number] [facility] PROGRESS NOTE  Todd Ortega OMB:[id number] DOB: [birth year] DOA: 05/13/2020 PCP: [name]., MD  HPI/Subjective: Patient feeling better today.  No back pain or abdominal pain.  No nausea or vomiting.  Tolerating diet.  Initially admitted 05/13/2020 with severe sepsis UTI and found to have a kidney stone with hydronephrosis.  Was on the critical care specialist service up until today.  Objective: Vitals:   05/16/20 1125 05/16/20 1606  BP: 123/68 124/65  Pulse: 77 72  Resp: (!) 32 16  Temp: 98.1 F (36.7 C) 98.1 F (36.7 C)  SpO2: 100% 96%    Intake/Output Summary (Last 24 hours) at 05/16/2020 1607 Last data filed at 05/16/2020 1402 Gross per 24 hour  Intake 480 ml  Output 1925 ml  Net -1445 ml   Filed Weights   05/13/20 1530  Weight: 72.6 kg    ROS: Review of Systems  Respiratory: Negative for shortness of breath.   Cardiovascular: Negative for chest pain.  Gastrointestinal: Negative for abdominal pain, nausea and vomiting.   Exam: Physical Exam HENT:     Head: Normocephalic.     Mouth/Throat:     Pharynx: No oropharyngeal exudate.  Eyes:     General: Lids are normal.     Conjunctiva/sclera: Conjunctivae normal.     Pupils: Pupils are equal, round, and reactive to light.  Cardiovascular:     Rate and Rhythm: Normal rate and regular rhythm.     Heart sounds: Normal heart sounds, S1 normal and S2 normal.  Pulmonary:     Breath sounds: No decreased breath sounds, wheezing, rhonchi or rales.  Abdominal:     Palpations: Abdomen is soft.     Tenderness: There is no abdominal tenderness.  Musculoskeletal:     Right ankle: No swelling.     Left ankle: No swelling.  Skin:    General: Skin is warm.     Findings: No rash.  Neurological:     Mental Status: He is alert and oriented to person, place, and time.       Data Reviewed: Basic Metabolic Panel: Recent  Labs  Lab 05/13/20 1536 05/14/20 0630 05/15/20 0508 05/16/20 0719  NA 136 137 141 138  K 3.6 4.2 4.1 3.6  CL 101 105 108 105  CO2 22 21* 25 25  GLUCOSE 124* 156* 123* 123*  BUN 35* 37* 34* 28*  CREATININE 2.22* 1.69* 1.09 0.96  CALCIUM 9.8 8.7* 8.6* 8.7*  MG  --   --  2.0  --   PHOS  --   --  1.6* 2.1*   Liver Function Tests: Recent Labs  Lab 05/13/20 1536 05/14/20 0630  AST 56* 67*  ALT 37 35  ALKPHOS 73 63  BILITOT 1.4* 0.9  PROT 7.8 6.7  ALBUMIN 3.8 2.9*   Recent Labs  Lab 05/13/20 1536  LIPASE 33   CBC: Recent Labs  Lab 05/13/20 1536 05/14/20 0630 05/15/20 0508 05/16/20 1100  WBC 14.9* 32.3* 27.6* 18.9*  HGB 14.6 13.9 13.2 12.9*  HCT 43.6 42.2 39.2 39.0  MCV 97.1 100.2* 97.0 95.1  PLT 144* 118* 92* 103*   CBG: Recent Labs  Lab 05/15/20 2035 05/16/20 0045 05/16/20 0349 05/16/20 0823 05/16/20 1121  GLUCAP 98 146* 129* 116* 171*    Recent Results (from the past 240 hour(s))  Urine Culture     Status: Abnormal   Collection Time: 05/13/20  7:06 PM   Specimen: Urine, Clean Catch  Result Value Ref Range Status   Specimen Description   Final    URINE, CLEAN CATCH Performed at [facility], [address] [place], [place] [address]    Special Requests   Final    NONE Performed at [facility], [address] [place], [place] [address]    Culture >=100,000 COLONIES/mL KLEBSIELLA OXYTOCA (A)  Final   Report Status 05/16/2020 FINAL  Final   Organism ID, Bacteria KLEBSIELLA OXYTOCA (A)  Final      Susceptibility   Klebsiella oxytoca - MIC*    AMPICILLIN >=32 RESISTANT Resistant     CEFAZOLIN <=4 SENSITIVE Sensitive     CEFEPIME <=0.12 SENSITIVE Sensitive     CEFTRIAXONE <=0.25 SENSITIVE Sensitive     CIPROFLOXACIN <=0.25 SENSITIVE Sensitive     GENTAMICIN <=1 SENSITIVE Sensitive     IMIPENEM <=0.25 SENSITIVE Sensitive     NITROFURANTOIN 32 SENSITIVE Sensitive     TRIMETH/SULFA <=20 SENSITIVE Sensitive      AMPICILLIN/SULBACTAM 4 SENSITIVE Sensitive     PIP/TAZO <=4 SENSITIVE Sensitive     * >=100,000 COLONIES/mL KLEBSIELLA OXYTOCA  Blood culture (routine x 2)     Status: Abnormal   Collection Time: 05/13/20  7:08 PM   Specimen: BLOOD RIGHT WRIST  Result Value Ref Range Status   Specimen Description   Final    BLOOD RIGHT WRIST Performed at [facility], [address], [place], [place] [address]    Special Requests   Final    BOTTLES DRAWN AEROBIC AND ANAEROBIC Blood Culture results may not be optimal due to an excessive volume of blood received in culture bottles Performed at [facility], [address] [place], [place] [address]    Culture  Setup Time   Final    Organism ID to follow [place] BOTTLES CRITICAL RESULT CALLED TO, READ BACK BY AND VERIFIED WITH: LISA KLUTZ ON 05/14/20 AT 0421 QSD Performed at [facility], [address] [place], [place] [address]    Culture KLEBSIELLA OXYTOCA (A)  Final   Report Status 05/16/2020 FINAL  Final   Organism ID, Bacteria KLEBSIELLA OXYTOCA  Final      Susceptibility   Klebsiella oxytoca - MIC*    AMPICILLIN >=32 RESISTANT Resistant     CEFAZOLIN <=4 SENSITIVE Sensitive     CEFEPIME <=0.12 SENSITIVE Sensitive     CEFTAZIDIME <=1 SENSITIVE Sensitive     CEFTRIAXONE <=0.25 SENSITIVE Sensitive     CIPROFLOXACIN <=0.25 SENSITIVE Sensitive     GENTAMICIN <=1 SENSITIVE Sensitive     IMIPENEM <=0.25 SENSITIVE Sensitive     TRIMETH/SULFA <=20 SENSITIVE Sensitive     AMPICILLIN/SULBACTAM 4 SENSITIVE Sensitive     PIP/TAZO <=4 SENSITIVE Sensitive     * KLEBSIELLA OXYTOCA  Blood Culture ID Panel (Reflexed)     Status: Abnormal   Collection Time: 05/13/20  7:08 PM  Result Value Ref Range Status   Enterococcus faecalis NOT DETECTED NOT DETECTED Final   Enterococcus Faecium NOT DETECTED NOT DETECTED Final   Listeria monocytogenes NOT DETECTED NOT DETECTED Final    Staphylococcus species NOT DETECTED NOT DETECTED Final   Staphylococcus aureus (BCID) NOT DETECTED NOT DETECTED Final   Staphylococcus epidermidis NOT DETECTED NOT DETECTED Final   Staphylococcus lugdunensis NOT DETECTED NOT DETECTED Final   Streptococcus species NOT DETECTED NOT DETECTED Final   Streptococcus agalactiae NOT DETECTED NOT DETECTED Final  Streptococcus pneumoniae NOT DETECTED NOT DETECTED Final   Streptococcus pyogenes NOT DETECTED NOT DETECTED Final   A.calcoaceticus-baumannii NOT DETECTED NOT DETECTED Final   Bacteroides fragilis NOT DETECTED NOT DETECTED Final   Enterobacterales DETECTED (A) NOT DETECTED Final    Comment: Enterobacterales represent a large order of gram negative bacteria, not a single organism. CRITICAL RESULT CALLED TO, READ BACK BY AND VERIFIED WITH: LISA KLUTZ ON 05/14/20 AT 0421 QSD    Enterobacter cloacae complex NOT DETECTED NOT DETECTED Final   Escherichia coli NOT DETECTED NOT DETECTED Final   Klebsiella aerogenes NOT DETECTED NOT DETECTED Final   Klebsiella oxytoca DETECTED (A) NOT DETECTED Final    Comment: CRITICAL RESULT CALLED TO, READ BACK BY AND VERIFIED WITH: LISA KLUTZ ON 05/14/20 AT 0421 QSD    Klebsiella pneumoniae NOT DETECTED NOT DETECTED Final   Proteus species NOT DETECTED NOT DETECTED Final   Salmonella species NOT DETECTED NOT DETECTED Final   Serratia marcescens NOT DETECTED NOT DETECTED Final   Haemophilus influenzae NOT DETECTED NOT DETECTED Final   Neisseria meningitidis NOT DETECTED NOT DETECTED Final   Pseudomonas aeruginosa NOT DETECTED NOT DETECTED Final   Stenotrophomonas maltophilia NOT DETECTED NOT DETECTED Final   Candida albicans NOT DETECTED NOT DETECTED Final   Candida auris NOT DETECTED NOT DETECTED Final   Candida glabrata NOT DETECTED NOT DETECTED Final   Candida krusei NOT DETECTED NOT DETECTED Final   Candida parapsilosis NOT DETECTED NOT DETECTED Final   Candida tropicalis NOT DETECTED NOT DETECTED  Final   Cryptococcus neoformans/gattii NOT DETECTED NOT DETECTED Final   CTX-M ESBL NOT DETECTED NOT DETECTED Final   Carbapenem resistance IMP NOT DETECTED NOT DETECTED Final   Carbapenem resistance KPC NOT DETECTED NOT DETECTED Final   Carbapenem resistance NDM NOT DETECTED NOT DETECTED Final   Carbapenem resist OXA 48 LIKE NOT DETECTED NOT DETECTED Final   Carbapenem resistance VIM NOT DETECTED NOT DETECTED Final    Comment: Performed at [facility], [address] [place],  [address]  Resp Panel by RT-PCR (Flu A&B, Covid) Nasopharyngeal Swab     Status: None   Collection Time: 05/13/20  7:12 PM   Specimen: Nasopharyngeal Swab; Nasopharyngeal(NP) swabs in vial transport medium  Result Value Ref Range Status   SARS Coronavirus 2 by RT PCR NEGATIVE NEGATIVE Final    Comment: (NOTE) SARS-CoV-2 target nucleic acids are NOT DETECTED.  The SARS-CoV-2 RNA is generally detectable in upper respiratory specimens during the acute phase of infection. The lowest concentration of SARS-CoV-2 viral copies this assay can detect is 138 copies/mL. A negative result does not preclude SARS-Cov-2 infection and should not be used as the sole basis for treatment or other patient management decisions. A negative result may occur with  improper specimen collection/handling, submission of specimen other than nasopharyngeal swab, presence of viral mutation(s) within the areas targeted by this assay, and inadequate number of viral copies(<138 copies/mL). A negative result must be combined with clinical observations, patient history, and epidemiological information. The expected result is Negative.  Fact Sheet for Patients:  EntrepreneurPulse.com.au  Fact Sheet for Healthcare Providers:  IncredibleEmployment.be  This test is no t yet approved or cleared by the [place] FDA and  has been authorized for detection and/or diagnosis of SARS-CoV-2  by FDA under an Emergency Use Authorization (EUA). This EUA will remain  in effect (meaning this test can be used) for the duration of the COVID-19 declaration under Section 564(b)(1) of the Act, 21 U.S.C.section 360bbb-3(b)(1),  unless the authorization is terminated  or revoked sooner.       Influenza A by PCR NEGATIVE NEGATIVE Final   Influenza B by PCR NEGATIVE NEGATIVE Final    Comment: (NOTE) The Xpert Xpress SARS-CoV-2/FLU/RSV plus assay is intended as an aid in the diagnosis of influenza from Nasopharyngeal swab specimens and should not be used as a sole basis for treatment. Nasal washings and aspirates are unacceptable for Xpert Xpress SARS-CoV-2/FLU/RSV testing.  Fact Sheet for Patients: EntrepreneurPulse.com.au  Fact Sheet for Healthcare Providers: IncredibleEmployment.be  This test is not yet approved or cleared by the [place] FDA and has been authorized for detection and/or diagnosis of SARS-CoV-2 by FDA under an Emergency Use Authorization (EUA). This EUA will remain in effect (meaning this test can be used) for the duration of the COVID-19 declaration under Section 564(b)(1) of the Act, 21 U.S.C. section 360bbb-3(b)(1), unless the authorization is terminated or revoked.  Performed at [facility], [address] [place], [place] [address]   Blood culture (routine x 2)     Status: Abnormal   Collection Time: 05/13/20  7:13 PM   Specimen: Left Antecubital; Blood  Result Value Ref Range Status   Specimen Description   Final    LEFT ANTECUBITAL Performed at [facility], [address] [place], [place] [address]    Special Requests   Final    BOTTLES DRAWN AEROBIC AND ANAEROBIC Blood Culture results may not be optimal due to an inadequate volume of blood received in culture bottles Performed at [facility], [address], [place], [place] [address]    Culture  Setup Time   Final     GRAM NEGATIVE RODS IN BOTH AEROBIC AND ANAEROBIC BOTTLES CRITICAL VALUE NOTED.  VALUE IS CONSISTENT WITH PREVIOUSLY REPORTED AND CALLED VALUE. Performed at [facility], [address] [place], [place] [address]    Culture (A)  Final    KLEBSIELLA OXYTOCA SUSCEPTIBILITIES PERFORMED ON PREVIOUS CULTURE WITHIN THE LAST 5 DAYS. Performed at [facility], [place] [address], [place], [place] [address]    Report Status 05/16/2020 FINAL  Final  MRSA PCR Screening     Status: None   Collection Time: 05/14/20  3:40 AM   Specimen: Nasopharyngeal  Result Value Ref Range Status   MRSA by PCR NEGATIVE NEGATIVE Final    Comment:        The GeneXpert MRSA Assay (FDA approved for NASAL specimens only), is one component of a comprehensive MRSA colonization surveillance program. It is not intended to diagnose MRSA infection nor to guide or monitor treatment for MRSA infections. Performed at [facility], [address] [place], [place] [address]      Studies: US RENAL  Result Date: 05/16/2020 CLINICAL DATA:  Hydronephrosis. EXAM: RENAL / URINARY TRACT ULTRASOUND COMPLETE COMPARISON:  Abdomen 05/14/2020. CT 05/13/2020. Ultrasound 02/26/2008. FINDINGS: Right Kidney: Renal measurements: 11.5 x 5.5 x 6.0 cm = volume: 201.1 mL. Echogenicity within normal limits. Mild right hydronephrosis. 7 mm calyceal stone. Visualized. Left Kidney: Renal measurements: 9.2 x 6.2 x 5.9 cm = volume: 175.4 mL. Echogenicity within normal limits. No mass or hydronephrosis visualized. 5 mm calyceal stone. Bladder: Bladder is nondistended. Right ureteral stent noted. 1.2 cm distal right ureteral stone again identified as noted previously on CT of 05/13/2020. Other: Prostate enlargement is again noted. IMPRESSION: 1. Mild right hydronephrosis again noted. Right ureteral stent noted. 1.2 cm stone again noted in the distal right ureter. 2. 7  mm calyceal stone right kidney. 5 mm calyceal stone left kidney.  No left hydronephrosis. 3. Bladder is nondistended. Prostate enlargement is again noted. Electronically Signed   By: Marcello Moores  Register   On: 05/16/2020 15:41   ECHOCARDIOGRAM COMPLETE  Result Date: 05/15/2020    ECHOCARDIOGRAM REPORT   Patient Name:   Todd Ortega Date of Exam: 05/15/2020 Medical Rec #:  [id number]   Height:       66.0 in Accession #:    [id number]  Weight:       160.0 lb Date of Birth:  [birth year]   BSA:          1.819 m Patient Age:    26 years    BP:           108/62 mmHg Patient Gender: M           HR:           89 bpm. Exam Location:  [facility] Procedure: 2D Echo, Color Doppler and Cardiac Doppler Indications:     I42.9 Cardiomyopathy-unspecified  History:         Patient has no prior history of Echocardiogram examinations.                  CAD, CKD, Signs/Symptoms:Shortness of Breath; Risk                  Factors:Sleep Apnea, Hypertension, Dyslipidemia and Diabetes.  Sonographer:     Charmayne Sheer RDCS (AE) Referring Phys:  [id number] CARMEN Veda Canning Diagnosing Phys: [name] MD IMPRESSIONS  1. Left ventricular ejection fraction, by estimation, is 65 to 70%. The left ventricle has normal function. The left ventricle has no regional wall motion abnormalities. Left ventricular diastolic parameters were normal.  2. Right ventricular systolic function is normal. The right ventricular size is normal.  3. The mitral valve is normal in structure. No evidence of mitral valve regurgitation.  4. The aortic valve is normal in structure. Aortic valve regurgitation is not visualized. FINDINGS  Left Ventricle: Left ventricular ejection fraction, by estimation, is 65 to 70%. The left ventricle has normal function. The left ventricle has no regional wall motion abnormalities. The left ventricular internal cavity size was normal in size. There is  no left ventricular hypertrophy. Left ventricular diastolic parameters were normal. Right Ventricle: The right ventricular size is normal. No increase in right  ventricular wall thickness. Right ventricular systolic function is normal. Left Atrium: Left atrial size was normal in size. Right Atrium: Right atrial size was normal in size. Pericardium: There is no evidence of pericardial effusion. Mitral Valve: The mitral valve is normal in structure. No evidence of mitral valve regurgitation. MV peak gradient, 6.6 mmHg. The mean mitral valve gradient is 3.0 mmHg. Tricuspid Valve: The tricuspid valve is normal in structure. Tricuspid valve regurgitation is trivial. Aortic Valve: The aortic valve is normal in structure. Aortic valve regurgitation is not visualized. Aortic valve mean gradient measures 3.0 mmHg. Aortic valve peak gradient measures 6.7 mmHg. Aortic valve area, by VTI measures 1.56 cm. Pulmonic Valve: The pulmonic valve was grossly normal. Pulmonic valve regurgitation is not visualized. Aorta: The ascending aorta was not well visualized. IAS/Shunts: No atrial level shunt detected by color flow Doppler. Additional Comments: A pacer wire is visualized.  LEFT VENTRICLE PLAX 2D LVIDd:         4.49 cm  Diastology LVIDs:         2.37 cm  LV e' medial:  7.07 cm/s LV PW:         0.79 cm  LV E/e' medial:  17.5 LV IVS:        0.85 cm  LV e' lateral:   7.94 cm/s LVOT diam:     2.00 cm  LV E/e' lateral: 15.6 LV SV:         37 LV SV Index:   21 LVOT Area:     3.14 cm  RIGHT VENTRICLE RV Basal diam:  2.77 cm LEFT ATRIUM             Index       RIGHT ATRIUM           Index LA diam:        3.20 cm 1.76 cm/m  RA Area:     10.10 cm LA Vol (A2C):   80.1 ml 44.04 ml/m RA Volume:   19.50 ml  10.72 ml/m LA Vol (A4C):   57.4 ml 31.56 ml/m LA Biplane Vol: 69.9 ml 38.43 ml/m  AORTIC VALVE                   PULMONIC VALVE AV Area (Vmax):    1.63 cm    PV Vmax:       1.00 m/s AV Area (Vmean):   1.54 cm    PV Vmean:      65.000 cm/s AV Area (VTI):     1.56 cm    PV VTI:        0.173 m AV Vmax:           129.00 cm/s PV Peak grad:  4.0 mmHg AV Vmean:          87.200 cm/s PV Mean  grad:  2.0 mmHg AV VTI:            0.239 m AV Peak Grad:      6.7 mmHg AV Mean Grad:      3.0 mmHg LVOT Vmax:         67.10 cm/s LVOT Vmean:        42.700 cm/s LVOT VTI:          0.119 m LVOT/AV VTI ratio: 0.50  AORTA Ao Root diam: 3.00 cm MITRAL VALVE MV Area (PHT): 4.59 cm     SHUNTS MV Peak grad:  6.6 mmHg     Systemic VTI:  0.12 m MV Mean grad:  3.0 mmHg     Systemic Diam: 2.00 cm MV Vmax:       1.28 m/s MV Vmean:      79.2 cm/s MV Decel Time: 165 msec MV E velocity: 124.00 cm/s [name] MD Electronically signed by [name] MD Signature Date/Time: 05/15/2020/6:18:15 PM    Final     Scheduled Meds: . aspirin  81 mg Oral Daily  . docusate  100 mg Oral BID  . enoxaparin (LOVENOX) injection  40 mg Subcutaneous Q24H  . finasteride  5 mg Oral QHS  . gabapentin  300 mg Oral Daily  . mouth rinse  15 mL Mouth Rinse BID  . pantoprazole  40 mg Oral Daily  . polyethylene glycol  17 g Oral Daily  . potassium & sodium phosphates  2 packet Oral Q4H  . prednisoLONE acetate  1 drop Right Eye Q2000  . simvastatin  40 mg Oral Daily  . tamsulosin  0.4 mg Oral QPC breakfast   Continuous Infusions: . sodium chloride 250 mL (05/16/20 1030)  . cefTRIAXone (ROCEPHIN)  IV 2 g (05/16/20 1227)    Assessment/Plan:  1. Septic shock, present on admission with Klebsiella oxytoca in both blood and urine cultures.  Secondary to obstructing kidney stone.  Patient on IV Rocephin.  Off pressors.  White blood cell count still elevated today at 18.9.  Infectious disease would like to recheck tomorrow. 2. Obstructive kidney stone and hydronephrosis taken to the OR for right ureteral stent placement by Dr. Diamantina Providence on 05/13/2020.  Will need outpatient definitive treatment for kidney stones.  Renal ultrasound today does show mild right hydronephrosis, 1.2 cm stone distal right ureter.  On Flomax 3. Acute kidney injury secondary to obstructive uropathy.  Creatinine 2.22 on presentation and improved to  0.96. 4. Elevated liver enzymes secondary to sepsis 5. Essential hypertension blood pressure medications on hold with blood pressure being normal now 6. Hyperlipidemia unspecified on simvastatin        Code Status:     Code Status Orders  (From admission, onward)         Start     Ordered   05/13/20 2009  Full code  Continuous        05/13/20 2010        Code Status History    Date Active Date Inactive Code Status Order ID Comments User Context   04/20/2015 1717 04/21/2015 1658 Full Code [id number]  Isaias Cowman, MD Inpatient   Advance Care Planning Activity     Family Communication: Patient put me on the phone with his family while is in the room. Disposition Plan: Status is: Inpatient  Dispo: The patient is from: Home              Anticipated d/c is to: Home              Anticipated d/c date is: Potential 05/17/2020 versus 05/18/2020              Patient currently with high white blood cell count and being treated for septic shock.  Infectious disease would like to give at least another day of IV antibiotics.  Consultants:  Infectious disease  Urology  Procedures:  Right ureteral stent  Antibiotics:  Rocephin  Time spent: 28 minutes  [place]

## 2020-05-16 NOTE — Care Management Important Message (Signed)
Important Message  Patient Details  Name: Todd Ortega MRN: [id number] Date of Birth: December 25, 1934   Medicare Important Message Given:  N/A - LOS <3 / Initial given by admissions  Initial Medicare IM reviewed with patient via room phone by Meredith Mody, Patient Access Associate on 05/15/2020 at 10:41am.   Dannette Barbara 05/16/2020, 9:11 AM

## 2020-05-16 NOTE — Progress Notes (Signed)
[place] for Electrolyte Monitoring and Replacement   Recent Labs: Potassium (mmol/L)  Date Value  05/16/2020 3.6   Magnesium (mg/dL)  Date Value  05/15/2020 2.0   Calcium (mg/dL)  Date Value  05/16/2020 8.7 (L)   Albumin (g/dL)  Date Value  05/14/2020 2.9 (L)  02/24/2020 3.9   Phosphorus (mg/dL)  Date Value  05/16/2020 2.1 (L)   Sodium (mmol/L)  Date Value  05/16/2020 138  02/24/2020 138   Corrected Ca: 9.6 mg/dL  Assessment: 85 year old male with K.oxytoca bacteremia. Patient is POD 3 right ureteral stent placement for management of a distal right ureteral stone. Patient is on ceftriaxone pending susceptibilities. Pharmacy to replace electrolytes. AKI now resolved  Goal of Therapy:  Electrolytes WNL  Plan:   Phosphorus remains low despite replacement:   Replace with PHOS NAK 2 packets x 2 doses  Will follow up with morning labs.  Dallie Piles ,PharmD Clinical Pharmacist 05/16/2020 8:04 AM

## 2020-05-16 NOTE — Progress Notes (Addendum)
PT Cancellation Note  Patient Details Name: Todd Ortega MRN: [id number] DOB: 08-04-1934   Cancelled Treatment:    Reason Eval/Treat Not Completed: Patient at procedure or test/unavailable (Consult received and chart reviewed. Patient currently off unit for diagnostic imaging.  Will re-attempt at later time/date as medically appropriate and available.)  Of note, per CNA, patient mobilizing indep in room without difficulty (taking self to/from bathroom).   Daniela Hernan H. Owens Shark, PT, DPT, NCS 05/16/20, 2:18 PM [phone]

## 2020-05-16 NOTE — Progress Notes (Signed)
ID Pt doing better No fever No pain abdomen Since removing Foley is passing urine okay  Objective On examination awake and alert Looks young for his age Patient Vitals for the past 24 hrs:  BP Temp Temp src Pulse Resp SpO2  05/16/20 2109 140/73 98.8 F (37.1 C) Oral 79 18 97 %  05/16/20 1606 124/65 98.1 F (36.7 C) -- 72 16 96 %  05/16/20 1125 123/68 98.1 F (36.7 C) -- 77 (!) 32 100 %  05/16/20 0348 114/61 98.2 F (36.8 C) -- 63 20 94 %   Chest clear to auscultation Heart sounds S1-S2 Abdomen soft CNS nonfocal  Labs CBC Latest Ref Rng & Units 05/16/2020 05/15/2020 05/14/2020  WBC 4.0 - 10.5 K/uL 18.9(H) 27.6(H) 32.3(H)  Hemoglobin 13.0 - 17.0 g/dL 12.9(L) 13.2 13.9  Hematocrit 39.0 - 52.0 % 39.0 39.2 42.2  Platelets 150 - 400 K/uL 103(L) 92(L) 118(L)   CMP Latest Ref Rng & Units 05/16/2020 05/15/2020 05/14/2020  Glucose 70 - 99 mg/dL 123(H) 123(H) 156(H)  BUN 8 - 23 mg/dL 28(H) 34(H) 37(H)  Creatinine 0.61 - 1.24 mg/dL 0.96 1.09 1.69(H)  Sodium 135 - 145 mmol/L 138 141 137  Potassium 3.5 - 5.1 mmol/L 3.6 4.1 4.2  Chloride 98 - 111 mmol/L 105 108 105  CO2 22 - 32 mmol/L 25 25 21(L)  Calcium 8.9 - 10.3 mg/dL 8.7(L) 8.6(L) 8.7(L)  Total Protein 6.5 - 8.1 g/dL - - 6.7  Total Bilirubin 0.3 - 1.2 mg/dL - - 0.9  Alkaline Phos 38 - 126 U/L - - 63  AST 15 - 41 U/L - - 67(H)  ALT 0 - 44 U/L - - 35   Micro 05/13/2020 blood culture is Klebsiella oxytoca pansensitive except for ampicillin  Urine culture Klebsiella oxytoca  Impression/recommendation  Klebsiella oxytoca bacteremia secondary to complicated urinary tract infection with right obstructive ureteric stone causing hydronephrosis and hydroureter.  Underwent cystoscopy, laser lithotripsy and stent placement.  Stone could not be removed.  Currently on ceftriaxone day 4.  He will need a total of 14 days of antibiotic.  On discharge can be switched to p.o. ciprofloxacin or Levaquin for another 9 days. But need to get  pharmacy to check drug interactions and also discussed with patient's the side effects of quinolone.  The other alternative is trimethoprim sulfamethoxazole.  AKI secondary to infection and complicated UTIs resolved.  Leukocytosis significantly declined today from 27-18.  Repeat ultrasound was done today which shows mild hydronephrosis persisting in the right side. Urology has no plans for any further intervention currently.  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 ID will follow him peripherally tomorrow.  Call [phone] if needed

## 2020-05-17 LAB — CBC
HCT: 39.1 % (ref 39.0–52.0)
Hemoglobin: 13.1 g/dL (ref 13.0–17.0)
MCH: 31.6 pg (ref 26.0–34.0)
MCHC: 33.5 g/dL (ref 30.0–36.0)
MCV: 94.2 fL (ref 80.0–100.0)
Platelets: 111 10*3/uL — ABNORMAL LOW (ref 150–400)
RBC: 4.15 MIL/uL — ABNORMAL LOW (ref 4.22–5.81)
RDW: 14.3 % (ref 11.5–15.5)
WBC: 11.6 10*3/uL — ABNORMAL HIGH (ref 4.0–10.5)
nRBC: 0 % (ref 0.0–0.2)

## 2020-05-17 LAB — BASIC METABOLIC PANEL
Anion gap: 9 (ref 5–15)
BUN: 20 mg/dL (ref 8–23)
CO2: 27 mmol/L (ref 22–32)
Calcium: 8.9 mg/dL (ref 8.9–10.3)
Chloride: 108 mmol/L (ref 98–111)
Creatinine, Ser: 0.71 mg/dL (ref 0.61–1.24)
GFR, Estimated: >60 mL/min (ref >60)
Glucose, Bld: 138 mg/dL — ABNORMAL HIGH (ref 70–99)
Potassium: 3.5 mmol/L (ref 3.5–5.1)
Sodium: 144 mmol/L (ref 135–145)

## 2020-05-17 LAB — MAGNESIUM: Magnesium: 1.8 mg/dL (ref 1.7–2.4)

## 2020-05-17 LAB — GLUCOSE, CAPILLARY
Glucose-Capillary: 121 mg/dL — ABNORMAL HIGH (ref 70–99)
Glucose-Capillary: 128 mg/dL — ABNORMAL HIGH (ref 70–99)
Glucose-Capillary: 163 mg/dL — ABNORMAL HIGH (ref 70–99)

## 2020-05-17 LAB — PROCALCITONIN: Procalcitonin: 19.04 ng/mL

## 2020-05-17 LAB — PHOSPHORUS: Phosphorus: 3.5 mg/dL (ref 2.5–4.6)

## 2020-05-17 MED ORDER — LEVOFLOXACIN 500 MG PO TABS: 500.0000 mg | ORAL_TABLET | Freq: Every day | ORAL | Status: DC

## 2020-05-17 MED ORDER — POTASSIUM CHLORIDE CRYS ER 20 MEQ PO TBCR
40.0000 meq | EXTENDED_RELEASE_TABLET | Freq: Once | ORAL | Status: AC
  Administered 2020-05-17: 09:00:00 40 meq via ORAL
  Filled 2020-05-17: qty 2

## 2020-05-17 MED ORDER — MAGNESIUM SULFATE 2 GM/50ML IV SOLN
2.0000 g | Freq: Once | INTRAVENOUS | Status: AC
  Administered 2020-05-17: 10:00:00 2 g via INTRAVENOUS
  Filled 2020-05-17: qty 50

## 2020-05-17 MED ORDER — LEVOFLOXACIN 500 MG PO TABS: 500.0000 mg | ORAL_TABLET | Freq: Every day | ORAL | 0 refills | Status: AC

## 2020-05-17 NOTE — Progress Notes (Signed)
Dc instructions given pt verbalizes understanding IV dc'd pt taken out via wc in apparent stable condition

## 2020-05-17 NOTE — Evaluation (Signed)
Physical Therapy Evaluation Patient Details Name: Todd Ortega MRN: [id number] DOB: 11-06-1934 Today's Date: 05/17/2020   History of Present Illness  Pt is an 85 yo male Initially admitted 05/13/2020 with severe sepsis UTI and found to have a kidney stone with hydronephrosis. Underwent cystoscopy and stent placement, experienced acute post operative respiratory failure on ventilator. Workup during hospital revealed acute kidney injury and severe sepsis with septic shock. PMH of BPH, complete heart block, CAD, CKD, HTN, neuropathy.    Clinical Impression  Patient alert agreeable to PT, reported at baseline he is independent, cooks, cleans drives. Did state he'll use a SPC for community ambulation intermittently, no falls in the last 6 months.  The patient demonstrated bed mobility modI, as well as good sitting balance. Sit <> stand from EOB with RW and supervision, cued for hand placement. He was able to ambulate ~235ft with RW and CGA/supervision. He did exhibit decreased gait velocity and stride length bilaterally, and tended to be flexed at the trunk despite cueing. No LOB noted. Overall the patient demonstrated near return to baseline functioning but would benefit from further skilled PT intervention to return to independent mobility and maximize function.     Follow Up Recommendations Home health PT;Outpatient PT    Equipment Recommendations  None recommended by PT    Recommendations for Other Services       Precautions / Restrictions Precautions Precautions: Fall Restrictions Weight Bearing Restrictions: No      Mobility  Bed Mobility Overal bed mobility: Modified Independent                  Transfers Overall transfer level: Needs assistance Equipment used: Rolling walker (2 wheeled) Transfers: Sit to/from Stand Sit to Stand: Supervision         General transfer comment: cued for hand placement  Ambulation/Gait Ambulation/Gait assistance: Min  guard;Supervision Gait Distance (Feet): 200 Feet Assistive device: Rolling walker (2 wheeled)   Gait velocity: decreased Gait velocity interpretation: 1.31 - 2.62 ft/sec, indicative of limited community ambulator General Gait Details: decreased stride length, decreased gait velocity, flexed trunk noted. Cued for RW placement to improve posture.  Stairs            Wheelchair Mobility    Modified Rankin (Stroke Patients Only)       Balance Overall balance assessment: Needs assistance Sitting-balance support: Feet supported Sitting balance-Leahy Scale: Normal       Standing balance-Leahy Scale: Good Standing balance comment: pt with improved safety and balance with UE support for dynamic activities                             Pertinent Vitals/Pain Pain Assessment: No/denies pain    Home Living Family/patient expects to be discharged to:: Private residence Living Arrangements: Spouse/significant other Available Help at Discharge: Family Type of Home: House Home Access: Stairs to enter Entrance Stairs-Rails: None Entrance Stairs-Number of Steps: 2 Home Layout: Able to live on main level with bedroom/bathroom;Two level Home Equipment: Walker - 2 wheels;Grab bars - tub/shower;Grab bars - toilet;Shower seat - built in;Cane - single point      Prior Function Level of Independence: Independent         Comments: will intermittently use SPC for community ambulation. cooks, cleans, drives     Hand Dominance   Dominant Hand: Right    Extremity/Trunk Assessment   Upper Extremity Assessment Upper Extremity Assessment: Overall WFL for tasks assessed    Lower  Extremity Assessment Lower Extremity Assessment: Overall WFL for tasks assessed    Cervical / Trunk Assessment Cervical / Trunk Assessment: Kyphotic  Communication   Communication: No difficulties  Cognition Arousal/Alertness: Awake/alert Behavior During Therapy: WFL for tasks  assessed/performed Overall Cognitive Status: Within Functional Limits for tasks assessed                                        General Comments      Exercises     Assessment/Plan    PT Assessment Patient needs continued PT services  PT Problem List Decreased activity tolerance;Decreased strength;Decreased knowledge of use of DME;Decreased mobility       PT Treatment Interventions DME instruction;Therapeutic exercise;Gait training;Balance training;Stair training;Functional mobility training;Therapeutic activities;Patient/family education;Neuromuscular re-education    PT Goals (Current goals can be found in the Care Plan section)  Acute Rehab PT Goals Patient Stated Goal: to go home PT Goal Formulation: With patient Time For Goal Achievement: 05/31/20 Potential to Achieve Goals: Good    Frequency Min 2X/week   Barriers to discharge        Co-evaluation               AM-PAC PT "6 Clicks" Mobility  Outcome Measure Help needed turning from your back to your side while in a flat bed without using bedrails?: None Help needed moving from lying on your back to sitting on the side of a flat bed without using bedrails?: None Help needed moving to and from a bed to a chair (including a wheelchair)?: None Help needed standing up from a chair using your arms (e.g., wheelchair or bedside chair)?: None Help needed to walk in hospital room?: A Little Help needed climbing 3-5 steps with a railing? : A Little 6 Click Score: 22    End of Session Equipment Utilized During Treatment: Gait belt Activity Tolerance: Patient tolerated treatment well Patient left: in chair;with call bell/phone within reach Nurse Communication: Mobility status PT Visit Diagnosis: Other abnormalities of gait and mobility (R26.89);Muscle weakness (generalized) (M62.81);Difficulty in walking, not elsewhere classified (R26.2)    Time: 5631-4970 PT Time Calculation (min) (ACUTE ONLY): 18  min   Charges:   PT Evaluation $PT Eval Low Complexity: 1 Low         Lieutenant Diego PT, DPT 11:50 AM,05/17/20

## 2020-05-17 NOTE — Progress Notes (Signed)
PT Cancellation Note  Patient Details Name: Todd Ortega MRN: [id number] DOB: [birth year]   Cancelled Treatment:    Reason Eval/Treat Not Completed: Other (comment). Pt eating breakfast at this time, requested to finish eating prior to working with PT, PT to re-attempt as able.  Lieutenant Diego PT, DPT 9:12 AM,05/17/20

## 2020-05-17 NOTE — Discharge Summary (Addendum)
[facility] NAME: Todd Ortega    MR#:  [id number]  DATE OF BIRTH:  [birth year]  DATE OF ADMISSION:  05/13/2020 ADMITTING PHYSICIAN: Shawna Clamp, MD  DATE OF DISCHARGE: 05/17/2020  PRIMARY CARE PHYSICIAN: [name]., MD    ADMISSION DIAGNOSIS:  Kidney stone [N20.0] AKI (acute kidney injury) ([place]) [N17.9] Severe sepsis ([place]) [A41.9, R65.20] Sepsis, due to unspecified organism, unspecified whether acute organ dysfunction present ([place]) [A41.9]  DISCHARGE DIAGNOSIS:  Klebsiella Sepsis Klebsiella UTI S/p Right distal ureteral stent placement  SECONDARY DIAGNOSIS:   Past Medical History:  Diagnosis Date  . Arthritis   . BPH (benign prostatic hypertrophy)   . Chronic kidney disease   . Complete heart block ([place])   . Coronary artery disease   . Diabetes mellitus without complication ([place])   . Foot drop, right foot   . Hyperlipidemia   . Hypertension   . Hyperthyroidism   . Nephrolithiasis   . Neuropathy   . Shortness of breath dyspnea   . Sleep apnea     HOSPITAL COURSE:   Todd Ortega is an 85 y.o. male  presenting with septic shock of urinary tract origin secondary to right distal ureteral stone and obstructive uropathy status post stent placement with postoperative respiratory failure requiring mechanical ventilation and vasopressors. Patient transferred out of ICU.  Septic shock, present on admission with Klebsiella oxytoca in both blood and urine cultures.  Secondary to obstructing kidney stone.  Patient on IV Rocephin.  Off pressors.  White blood cell count still elevated yesterday at 18.9.  Infectious disease would like to recheck and white count today is 11.6 K.. Patient is afebrile. Doing well. Change to oral Levaquin starting tomorrow for nine more days. ID recommends antibiotics for total 14 days. Sepsis resolved. Obstructive kidney stone and hydronephrosis taken to the OR for right ureteral stent  placement by Dr. Diamantina Providence on 05/13/2020.  Will need outpatient definitive treatment for kidney stones.  Renal ultrasound  does show mild right hydronephrosis, 1.2 cm stone distal right ureter.  On Flomax. Patient will follow-up with Dr. Dorette Grate as outpatient. Acute kidney injury secondary to obstructive uropathy.  Creatinine 2.22 on presentation and improved to 0.96. Patient urinating well. Elevated liver enzymes secondary to sepsis Essential hypertension-- BP meds resume. Blood pressure systolic 841-- 324  Hyperlipidemia unspecified on simvastatin Thyroid DO--pt will f/u with PCP regarding this. I am told he is not taking any synthroid or methimazole.   overall improved. Patient will go home today. Patient in agreement. Spoke with patient's son Kel Senn on the phone. He is in agreement with the plan. Discussed discharge instructions with son on the phone.  Physical therapy recommends home health PT which will be arranged. CONSULTS OBTAINED:  Treatment Team:  Billey Co, MD  DRUG ALLERGIES:   Allergies  Allergen Reactions  . Venlafaxine     Other reaction(s): Other (See Comments) Sweating, stomach upset, constipation    DISCHARGE MEDICATIONS:   Allergies as of 05/17/2020      Reactions   Venlafaxine    Other reaction(s): Other (See Comments) Sweating, stomach upset, constipation      Medication List    STOP taking these medications   levothyroxine 175 MCG tablet Commonly known as: SYNTHROID   methimazole 5 MG tablet Commonly known as: TAPAZOLE     TAKE these medications   acetaminophen 325 MG tablet Commonly known as: TYLENOL Take 650 mg by mouth every 6 (six)  hours as needed.   aspirin 81 MG tablet Take 81 mg by mouth daily.   CALCIUM 500+D3 PO Take 1 tablet by mouth daily.   cyanocobalamin 100 MCG tablet Take 100 mcg by mouth daily.   finasteride 5 MG tablet Commonly known as: PROSCAR Take 1 tablet (5 mg total) by mouth at bedtime.    gabapentin 300 MG capsule Commonly known as: NEURONTIN Take 1 capsule (300 mg total) by mouth daily.   glucose blood test strip BAYER CONTOUR TEST (In Vitro Strip)  1 (one) Strip Strip check sugars once daily, or as needed for 0 days  Quantity: 300;  Refills: 4   Ordered :31-Mar-2013  [name] ;  Started 31-Mar-2013 Active Comments: Medication taken as needed.   levofloxacin 500 MG tablet Commonly known as: LEVAQUIN Take 1 tablet (500 mg total) by mouth daily for 9 days. Start taking on: May 18, 2020   lisinopril 10 MG tablet Commonly known as: ZESTRIL Take 10 mg by mouth daily.   loratadine 10 MG tablet Commonly known as: CLARITIN Take 1 tablet (10 mg total) by mouth daily.   multivitamin capsule Take 1 capsule by mouth daily.   simvastatin 40 MG tablet Commonly known as: ZOCOR TAKE 1 TABLET EVERY DAY   tamsulosin 0.4 MG Caps capsule Commonly known as: FLOMAX TAKE 1 CAPSULE (0.4 MG TOTAL) BY MOUTH DAILY AFTER BREAKFAST.   Vitamin D3 50 MCG (2000 UT) capsule Take 2,000 Units by mouth daily.       If you experience worsening of your admission symptoms, develop shortness of breath, life threatening emergency, suicidal or homicidal thoughts you must seek medical attention immediately by calling 911 or calling your MD immediately  if symptoms less severe.  You Must read complete instructions/literature along with all the possible adverse reactions/side effects for all the Medicines you take and that have been prescribed to you. Take any new Medicines after you have completely understood and accept all the possible adverse reactions/side effects.   Please note  You were cared for by a hospitalist during your hospital stay. If you have any questions about your discharge medications or the care you received while you were in the hospital after you are discharged, you can call the unit and asked to speak with the hospitalist on call if the hospitalist that took care of  you is not available. Once you are discharged, your primary care physician will handle any further medical issues. Please note that NO REFILLS for any discharge medications will be authorized once you are discharged, as it is imperative that you return to your primary care physician (or establish a relationship with a primary care physician if you do not have one) for your aftercare needs so that they can reassess your need for medications and monitor your lab values. Today   SUBJECTIVE   No new complaints. Sitting out in the chair. Uses walker at home. Work with physical therapy. No fever. No new issues per RN.  VITAL SIGNS:  Blood pressure (!) 143/76, pulse 77, temperature 98.1 F (36.7 C), temperature source Oral, resp. rate 16, height 5\' 6"  (1.676 m), weight 72.6 kg, SpO2 97 %.  I/O:    Intake/Output Summary (Last 24 hours) at 05/17/2020 1313 Last data filed at 05/17/2020 1000 Gross per 24 hour  Intake 1418.81 ml  Output 1625 ml  Net -206.19 ml    PHYSICAL EXAMINATION:  GENERAL:  85 y.o.-year-old patient lying in the bed with no acute distress.  EYES: Pupils  equal, round, reactive to light and accommodation. No scleral icterus.  HEENT: Head atraumatic, normocephalic. Oropharynx and nasopharynx clear.  NECK:  Supple, no jugular venous distention. No thyroid enlargement, no tenderness.  LUNGS: Normal breath sounds bilaterally, no wheezing, rales,rhonchi or crepitation. No use of accessory muscles of respiration.  CARDIOVASCULAR: S1, S2 normal. No murmurs, rubs, or gallops.  ABDOMEN: Soft, non-tender, non-distended. Bowel sounds present. No organomegaly or mass.  EXTREMITIES: No pedal edema, cyanosis, or clubbing.  NEUROLOGIC: Cranial nerves II through XII are intact. Muscle strength 5/5 in all extremities. Sensation intact. Gait not checked.  PSYCHIATRIC: The patient is alert and oriented x 3.  SKIN: No obvious rash, lesion, or ulcer.   DATA REVIEW:   CBC  Recent Labs  Lab  05/17/20 0446  WBC 11.6*  HGB 13.1  HCT 39.1  PLT 111*    Chemistries  Recent Labs  Lab 05/14/20 0630 05/15/20 0508 05/17/20 0446  NA 137   < > 144  K 4.2   < > 3.5  CL 105   < > 108  CO2 21*   < > 27  GLUCOSE 156*   < > 138*  BUN 37*   < > 20  CREATININE 1.69*   < > 0.71  CALCIUM 8.7*   < > 8.9  MG  --    < > 1.8  AST 67*  --   --   ALT 35  --   --   ALKPHOS 63  --   --   BILITOT 0.9  --   --    < > = values in this interval not displayed.    Microbiology Results   Recent Results (from the past 240 hour(s))  Urine Culture     Status: Abnormal   Collection Time: 05/13/20  7:06 PM   Specimen: Urine, Clean Catch  Result Value Ref Range Status   Specimen Description   Final    URINE, CLEAN CATCH Performed at [facility], [address], [place], [place] [address]    Special Requests   Final    NONE Performed at [facility], [address] [place], [place] [address]    Culture >=100,000 COLONIES/mL KLEBSIELLA OXYTOCA (A)  Final   Report Status 05/16/2020 FINAL  Final   Organism ID, Bacteria KLEBSIELLA OXYTOCA (A)  Final      Susceptibility   Klebsiella oxytoca - MIC*    AMPICILLIN >=32 RESISTANT Resistant     CEFAZOLIN <=4 SENSITIVE Sensitive     CEFEPIME <=0.12 SENSITIVE Sensitive     CEFTRIAXONE <=0.25 SENSITIVE Sensitive     CIPROFLOXACIN <=0.25 SENSITIVE Sensitive     GENTAMICIN <=1 SENSITIVE Sensitive     IMIPENEM <=0.25 SENSITIVE Sensitive     NITROFURANTOIN 32 SENSITIVE Sensitive     TRIMETH/SULFA <=20 SENSITIVE Sensitive     AMPICILLIN/SULBACTAM 4 SENSITIVE Sensitive     PIP/TAZO <=4 SENSITIVE Sensitive     * >=100,000 COLONIES/mL KLEBSIELLA OXYTOCA  Blood culture (routine x 2)     Status: Abnormal   Collection Time: 05/13/20  7:08 PM   Specimen: BLOOD RIGHT WRIST  Result Value Ref Range Status   Specimen Description   Final    BLOOD RIGHT WRIST Performed at [facility], [address] [place], [place]  [address]    Special Requests   Final    BOTTLES DRAWN AEROBIC AND ANAEROBIC Blood Culture results may not be optimal due to an excessive volume of blood received in  culture bottles Performed at [facility], [address] [place], [place] [address]    Culture  Setup Time   Final    Organism ID to follow [place] IN BOTH AEROBIC AND ANAEROBIC BOTTLES CRITICAL RESULT CALLED TO, READ BACK BY AND VERIFIED WITH: LISA KLUTZ ON 05/14/20 AT 0421 QSD Performed at [facility], [address] [place], [place] [address]    Culture KLEBSIELLA OXYTOCA (A)  Final   Report Status 05/16/2020 FINAL  Final   Organism ID, Bacteria KLEBSIELLA OXYTOCA  Final      Susceptibility   Klebsiella oxytoca - MIC*    AMPICILLIN >=32 RESISTANT Resistant     CEFAZOLIN <=4 SENSITIVE Sensitive     CEFEPIME <=0.12 SENSITIVE Sensitive     CEFTAZIDIME <=1 SENSITIVE Sensitive     CEFTRIAXONE <=0.25 SENSITIVE Sensitive     CIPROFLOXACIN <=0.25 SENSITIVE Sensitive     GENTAMICIN <=1 SENSITIVE Sensitive     IMIPENEM <=0.25 SENSITIVE Sensitive     TRIMETH/SULFA <=20 SENSITIVE Sensitive     AMPICILLIN/SULBACTAM 4 SENSITIVE Sensitive     PIP/TAZO <=4 SENSITIVE Sensitive     * KLEBSIELLA OXYTOCA  Blood Culture ID Panel (Reflexed)     Status: Abnormal   Collection Time: 05/13/20  7:08 PM  Result Value Ref Range Status   Enterococcus faecalis NOT DETECTED NOT DETECTED Final   Enterococcus Faecium NOT DETECTED NOT DETECTED Final   Listeria monocytogenes NOT DETECTED NOT DETECTED Final   Staphylococcus species NOT DETECTED NOT DETECTED Final   Staphylococcus aureus (BCID) NOT DETECTED NOT DETECTED Final   Staphylococcus epidermidis NOT DETECTED NOT DETECTED Final   Staphylococcus lugdunensis NOT DETECTED NOT DETECTED Final   Streptococcus species NOT DETECTED NOT DETECTED Final   Streptococcus agalactiae NOT DETECTED NOT DETECTED Final   Streptococcus pneumoniae NOT DETECTED NOT DETECTED Final    Streptococcus pyogenes NOT DETECTED NOT DETECTED Final   A.calcoaceticus-baumannii NOT DETECTED NOT DETECTED Final   Bacteroides fragilis NOT DETECTED NOT DETECTED Final   Enterobacterales DETECTED (A) NOT DETECTED Final    Comment: Enterobacterales represent a large order of gram negative bacteria, not a single organism. CRITICAL RESULT CALLED TO, READ BACK BY AND VERIFIED WITH: LISA KLUTZ ON 05/14/20 AT 0421 QSD    Enterobacter cloacae complex NOT DETECTED NOT DETECTED Final   Escherichia coli NOT DETECTED NOT DETECTED Final   Klebsiella aerogenes NOT DETECTED NOT DETECTED Final   Klebsiella oxytoca DETECTED (A) NOT DETECTED Final    Comment: CRITICAL RESULT CALLED TO, READ BACK BY AND VERIFIED WITH: LISA KLUTZ ON 05/14/20 AT 0421 QSD    Klebsiella pneumoniae NOT DETECTED NOT DETECTED Final   Proteus species NOT DETECTED NOT DETECTED Final   Salmonella species NOT DETECTED NOT DETECTED Final   Serratia marcescens NOT DETECTED NOT DETECTED Final   Haemophilus influenzae NOT DETECTED NOT DETECTED Final   Neisseria meningitidis NOT DETECTED NOT DETECTED Final   Pseudomonas aeruginosa NOT DETECTED NOT DETECTED Final   Stenotrophomonas maltophilia NOT DETECTED NOT DETECTED Final   Candida albicans NOT DETECTED NOT DETECTED Final   Candida auris NOT DETECTED NOT DETECTED Final   Candida glabrata NOT DETECTED NOT DETECTED Final   Candida krusei NOT DETECTED NOT DETECTED Final   Candida parapsilosis NOT DETECTED NOT DETECTED Final   Candida tropicalis NOT DETECTED NOT DETECTED Final   Cryptococcus neoformans/gattii NOT DETECTED NOT DETECTED Final   CTX-M ESBL NOT DETECTED NOT DETECTED Final   Carbapenem resistance IMP NOT DETECTED NOT DETECTED  Final   Carbapenem resistance KPC NOT DETECTED NOT DETECTED Final   Carbapenem resistance NDM NOT DETECTED NOT DETECTED Final   Carbapenem resist OXA 48 LIKE NOT DETECTED NOT DETECTED Final   Carbapenem resistance VIM NOT DETECTED NOT DETECTED  Final    Comment: Performed at [facility], [address] [place], [place] [address]  Resp Panel by RT-PCR (Flu A&B, Covid) Nasopharyngeal Swab     Status: None   Collection Time: 05/13/20  7:12 PM   Specimen: Nasopharyngeal Swab; Nasopharyngeal(NP) swabs in vial transport medium  Result Value Ref Range Status   SARS Coronavirus 2 by RT PCR NEGATIVE NEGATIVE Final    Comment: (NOTE) SARS-CoV-2 target nucleic acids are NOT DETECTED.  The SARS-CoV-2 RNA is generally detectable in upper respiratory specimens during the acute phase of infection. The lowest concentration of SARS-CoV-2 viral copies this assay can detect is 138 copies/mL. A negative result does not preclude SARS-Cov-2 infection and should not be used as the sole basis for treatment or other patient management decisions. A negative result may occur with  improper specimen collection/handling, submission of specimen other than nasopharyngeal swab, presence of viral mutation(s) within the areas targeted by this assay, and inadequate number of viral copies(<138 copies/mL). A negative result must be combined with clinical observations, patient history, and epidemiological information. The expected result is Negative.  Fact Sheet for Patients:  EntrepreneurPulse.com.au  Fact Sheet for Healthcare Providers:  IncredibleEmployment.be  This test is no t yet approved or cleared by the [place] FDA and  has been authorized for detection and/or diagnosis of SARS-CoV-2 by FDA under an Emergency Use Authorization (EUA). This EUA will remain  in effect (meaning this test can be used) for the duration of the COVID-19 declaration under Section 564(b)(1) of the Act, 21 U.S.C.section 360bbb-3(b)(1), unless the authorization is terminated  or revoked sooner.       Influenza A by PCR NEGATIVE NEGATIVE Final   Influenza B by PCR NEGATIVE NEGATIVE Final    Comment: (NOTE) The Xpert  Xpress SARS-CoV-2/FLU/RSV plus assay is intended as an aid in the diagnosis of influenza from Nasopharyngeal swab specimens and should not be used as a sole basis for treatment. Nasal washings and aspirates are unacceptable for Xpert Xpress SARS-CoV-2/FLU/RSV testing.  Fact Sheet for Patients: EntrepreneurPulse.com.au  Fact Sheet for Healthcare Providers: IncredibleEmployment.be  This test is not yet approved or cleared by the [place] FDA and has been authorized for detection and/or diagnosis of SARS-CoV-2 by FDA under an Emergency Use Authorization (EUA). This EUA will remain in effect (meaning this test can be used) for the duration of the COVID-19 declaration under Section 564(b)(1) of the Act, 21 U.S.C. section 360bbb-3(b)(1), unless the authorization is terminated or revoked.  Performed at [facility], [address] [place], [place] [address]   Blood culture (routine x 2)     Status: Abnormal   Collection Time: 05/13/20  7:13 PM   Specimen: Left Antecubital; Blood  Result Value Ref Range Status   Specimen Description   Final    LEFT ANTECUBITAL Performed at [facility], [address] [place], [place] [address]    Special Requests   Final    BOTTLES DRAWN AEROBIC AND ANAEROBIC Blood Culture results may not be optimal due to an inadequate volume of blood received in culture bottles Performed at [facility], [address] [place], [place] [address]    Culture  Setup Time   Final  GRAM NEGATIVE RODS IN BOTH AEROBIC AND ANAEROBIC BOTTLES CRITICAL VALUE NOTED.  VALUE IS CONSISTENT WITH PREVIOUSLY REPORTED AND CALLED VALUE. Performed at [facility], [address] [place], [place] [address]    Culture (A)  Final    KLEBSIELLA OXYTOCA SUSCEPTIBILITIES PERFORMED ON PREVIOUS CULTURE WITHIN THE LAST 5 DAYS. Performed at [facility], [place] [address], [place], [place] [address]     Report Status 05/16/2020 FINAL  Final  MRSA PCR Screening     Status: None   Collection Time: 05/14/20  3:40 AM   Specimen: Nasopharyngeal  Result Value Ref Range Status   MRSA by PCR NEGATIVE NEGATIVE Final    Comment:        The GeneXpert MRSA Assay (FDA approved for NASAL specimens only), is one component of a comprehensive MRSA colonization surveillance program. It is not intended to diagnose MRSA infection nor to guide or monitor treatment for MRSA infections. Performed at [facility], [address] [place], [place] [address]     RADIOLOGY:  US RENAL  Result Date: 05/16/2020 CLINICAL DATA:  Hydronephrosis. EXAM: RENAL / URINARY TRACT ULTRASOUND COMPLETE COMPARISON:  Abdomen 05/14/2020. CT 05/13/2020. Ultrasound 02/26/2008. FINDINGS: Right Kidney: Renal measurements: 11.5 x 5.5 x 6.0 cm = volume: 201.1 mL. Echogenicity within normal limits. Mild right hydronephrosis. 7 mm calyceal stone. Visualized. Left Kidney: Renal measurements: 9.2 x 6.2 x 5.9 cm = volume: 175.4 mL. Echogenicity within normal limits. No mass or hydronephrosis visualized. 5 mm calyceal stone. Bladder: Bladder is nondistended. Right ureteral stent noted. 1.2 cm distal right ureteral stone again identified as noted previously on CT of 05/13/2020. Other: Prostate enlargement is again noted. IMPRESSION: 1. Mild right hydronephrosis again noted. Right ureteral stent noted. 1.2 cm stone again noted in the distal right ureter. 2. 7 mm calyceal stone right kidney. 5 mm calyceal stone left kidney. No left hydronephrosis. 3. Bladder is nondistended. Prostate enlargement is again noted. Electronically Signed   By: Marcello Moores  Register   On: 05/16/2020 15:41     CODE STATUS:     Code Status Orders  (From admission, onward)         Start     Ordered   05/13/20 2009  Full code  Continuous        05/13/20 2010        Code Status History    Date Active Date Inactive Code Status Order ID Comments User  Context   04/20/2015 1717 04/21/2015 1658 Full Code [id number]  Isaias Cowman, MD Inpatient   Advance Care Planning Activity       TOTAL TIME TAKING CARE OF THIS PATIENT: *35* minutes.    Fritzi Mandes M.D  [facility]    CC: Primary care physician; [name]., MD

## 2020-05-17 NOTE — Progress Notes (Signed)
[place] for Electrolyte Monitoring and Replacement   Recent Labs: Potassium (mmol/L)  Date Value  05/17/2020 3.5   Magnesium (mg/dL)  Date Value  05/17/2020 1.8   Calcium (mg/dL)  Date Value  05/17/2020 8.9   Albumin (g/dL)  Date Value  05/14/2020 2.9 (L)  02/24/2020 3.9   Phosphorus (mg/dL)  Date Value  05/17/2020 3.5   Sodium (mmol/L)  Date Value  05/17/2020 144  02/24/2020 138   Corrected Ca: 9.6 mg/dL  Assessment: 85 year old male with K.oxytoca bacteremia. Patient is POD 4 right ureteral stent placement for management of a distal right ureteral stone. Patient is on ceftriaxone pending susceptibilities. Pharmacy to replace electrolytes. AKI now resolved. Complete heart block has pacemaker  Goal of Therapy:  Electrolytes WNL  Plan:   40 mEq oral KCl  2 grams IV magnesium sulfate x 1  Will follow up with morning labs  Dallie Piles ,PharmD Clinical Pharmacist 05/17/2020 7:08 AM

## 2020-05-17 NOTE — TOC Transition Note (Signed)
Transition of Care [facility]) - CM/SW Discharge Note   Patient Details  Name: Todd Ortega MRN: [id number] Date of Birth: [birth year]  [facility] CM/SW Contact:  Candie Chroman, LCSW Phone Number: 05/17/2020, 12:26 PM   Clinical Narrative:  CSW met with patient. No supports at bedside. CSW introduced role and explained that PT recommendations would be discussed. Patient declined home health. He will reach out to his PCP if he changes his mind once he returns home. Patient has a rolling walker and cane at home. No further concerns. Patient has orders to discharge home today. His wife and son will pick him up. CSW signing off.  Final next level of care: Home/Self Care Barriers to Discharge: No Barriers Identified   Patient Goals and CMS Choice        Discharge Placement                       Discharge Plan and Services     Post Acute Care Choice: NA                               Social Determinants of Health (SDOH) Interventions     Readmission Risk Interventions No flowsheet data found.

## 2020-05-18 ENCOUNTER — Telehealth: Payer: Self-pay

## 2020-05-18 NOTE — Telephone Encounter (Signed)
HFU scheduled 05/25/20.

## 2020-05-18 NOTE — Telephone Encounter (Signed)
Transition Care Management Follow-up Telephone Call  Date of discharge and from where: [facility] on 05/17/20  How have you been since you were released from the hospital? Doing better. Pt states that his right hand is stiff. Pt's wife believes this may be due to the IV. Also pts left shoulder is bothering him. The stinging pain started this morning. Pt is curious to if this is a side effect to the abx. Current pain level is about a 2-3 and is not radiating elsewhere. Pt has not taken any OTC meds for pain except his daily Aspirin. Pt states the hospital told him he could take Tylenol as needed for pain. Advised pt to take the Tylenol with this pain. Pt states he slept good last night but was up frequently due having to urinate. Pt states he has urgency to urinate but not other urinary s/s. Declines pain elsewhere, fever, SOB, chills or n/v/d.  Any questions or concerns? Yes, see above above regarding shoulder and hand pain.    Items Reviewed:  Did the pt receive and understand the discharge instructions provided? Yes   Medications obtained and verified? No, pt declined reviewing over the phone at this time. Will review at [place].  Any new allergies since your discharge? No   Dietary orders reviewed? Yes  Do you have support at home? Yes   Other (ie: DME, Home Health, etc): N/A  Functional Questionnaire: (I = Independent and D = Dependent)  Bathing/Dressing- I   Meal Prep- I  Eating- I  Maintaining continence- I  Transferring/Ambulation- D, using a cane or walker.  Managing Meds- I   Follow up appointments reviewed:    PCP Hospital f/u appt confirmed? Yes  scheduled to see Dr Rosanna Randy on 05/25/20 @ 8:40 AM.  [facility] f/u appt confirmed? No, pt to call Dr Shawna Clamp office today to schedule a f/u.   Are transportation arrangements needed? No   If their condition worsens, is the pt aware to call  their PCP or go to the ED? Yes  Was the patient provided with contact  information for the PCP's office or ED? Yes  Was the pt encouraged to call back with questions or concerns? Yes

## 2020-05-19 LAB — BLOOD GAS, ARTERIAL
Acid-base deficit: 4.2 mmol/L — ABNORMAL HIGH (ref 0.0–2.0)
Bicarbonate: 21 mmol/L (ref 20.0–28.0)
FIO2: 0.5
MECHVT: 500 mL
Mechanical Rate: 18
O2 Saturation: 82.9 %
PEEP: 5 cmH2O
Patient temperature: 37
pCO2 arterial: 38 mmHg (ref 32.0–48.0)
pH, Arterial: 7.35 (ref 7.350–7.450)
pO2, Arterial: 50 mmHg — ABNORMAL LOW (ref 83.0–108.0)

## 2020-05-22 ENCOUNTER — Other Ambulatory Visit: Payer: Self-pay

## 2020-05-22 DIAGNOSIS — H25012 Cortical age-related cataract, left eye: Secondary | ICD-10-CM | POA: Diagnosis not present

## 2020-05-22 DIAGNOSIS — H2512 Age-related nuclear cataract, left eye: Secondary | ICD-10-CM | POA: Diagnosis not present

## 2020-05-22 NOTE — Patient Outreach (Signed)
[facility]) Care Management  05/22/2020  Todd Ortega 17-Sep-1934 [id number]   EMMI- General Discharge RED ON EMMI ALERT Day # 1 Date: 05/19/20 Red Alert Reason:  Scheduled follow up? No  Outreach attempt:Telephone call to patient for follow up. He reports he is doing good building his strength. Addressed red alert with patient.  Patient reports he already has follow up and no problems with transportation.  Discussed [facility] services and follow up. Patient declined at this time. Advised patient that he would get another call and if there is a questionable response CM would call again. He verbalized understanding.   Plan: RN CM will close case.   Jone Baseman, RN, MSN [facility] Care Management Care Management Coordinator Direct Line [phone] Toll Free: [phone]  Fax: [phone]

## 2020-05-24 NOTE — Progress Notes (Signed)
Established patient visit   Patient: Todd Ortega   DOB: 11/29/1934   85 y.o. Male  MRN: [id number] Visit Date: 05/25/2020  Today's healthcare provider: Megan Mans, MD   Chief Complaint  Patient presents with  . Hospitalization Follow-up   Subjective    HPI   Follow up Hospitalization Transition of care visit for sepsis, cute kidney injury [place] to stone and infection. He is feeling better.  Hydronephrosis and still being followed by urology. Patient was admitted to [facility] on 05/13/2020 and discharged on 05/17/2020. He was treated for; Kidney stone, AKI, Severe sepsis. Treatment for this included; see notes in chart. Telephone follow up was done on 05/18/2020 He reports good compliance with treatment. He reports this condition is improved.       Medications: Outpatient Medications Prior to Visit  Medication Sig  . acetaminophen (TYLENOL) 325 MG tablet Take 650 mg by mouth every 6 (six) hours as needed.  Marland Kitchen aspirin 81 MG tablet Take 81 mg by mouth daily.   . Calcium Carb-Cholecalciferol (CALCIUM 500+D3 PO) Take 1 tablet by mouth daily.  . Cholecalciferol (VITAMIN D3) 2000 UNITS capsule Take 2,000 Units by mouth daily.   . cyanocobalamin 100 MCG tablet Take 100 mcg by mouth daily.  . finasteride (PROSCAR) 5 MG tablet Take 1 tablet (5 mg total) by mouth at bedtime.  . gabapentin (NEURONTIN) 300 MG capsule Take 1 capsule (300 mg total) by mouth daily.  Marland Kitchen glucose blood test strip BAYER CONTOUR TEST (In Vitro Strip)  1 (one) Strip Strip check sugars once daily, or as needed for 0 days  Quantity: 300;  Refills: 4   Ordered :31-Mar-2013  Janey Greaser ;  Started 31-Mar-2013 Active Comments: Medication taken as needed.  Marland Kitchen levofloxacin (LEVAQUIN) 500 MG tablet Take 1 tablet (500 mg total) by mouth daily for 9 days.  Marland Kitchen lisinopril (PRINIVIL,ZESTRIL) 10 MG tablet Take 10 mg by mouth daily.   Marland Kitchen loratadine (CLARITIN) 10 MG tablet Take 1 tablet (10 mg total) by mouth daily.  .  Multiple Vitamin (MULTIVITAMIN) capsule Take 1 capsule by mouth daily.  . simvastatin (ZOCOR) 40 MG tablet TAKE 1 TABLET EVERY DAY  . tamsulosin (FLOMAX) 0.4 MG CAPS capsule TAKE 1 CAPSULE (0.4 MG TOTAL) BY MOUTH DAILY AFTER BREAKFAST.   No facility-administered medications prior to visit.    Review of Systems  Constitutional: Negative for appetite change, chills and fever.  Respiratory: Negative for chest tightness, shortness of breath and wheezing.   Cardiovascular: Negative for chest pain and palpitations.  Gastrointestinal: Negative for abdominal pain, nausea and vomiting.       Objective    BP 117/61   Pulse 73   Temp 98 F (36.7 C)   Resp 16   Wt 163 lb (73.9 kg)   BMI 26.31 kg/m  BP Readings from Last 3 Encounters:  05/25/20 117/61  05/17/20 (!) 143/76  02/24/20 124/64   Wt Readings from Last 3 Encounters:  05/25/20 163 lb (73.9 kg)  05/13/20 160 lb (72.6 kg)  02/24/20 168 lb 6.4 oz (76.4 kg)      Physical Exam Vitals and nursing note reviewed.  Constitutional:      Appearance: He is well-developed.  HENT:     Head: Normocephalic and atraumatic.     Right Ear: External ear normal.     Left Ear: External ear normal.     Nose: Nose normal.  Eyes:     Conjunctiva/sclera: Conjunctivae normal.  Pupils: Pupils are equal, round, and reactive to light.  Cardiovascular:     Rate and Rhythm: Normal rate and regular rhythm.     Heart sounds: Normal heart sounds.  Pulmonary:     Effort: Pulmonary effort is normal.     Breath sounds: Normal breath sounds.  Abdominal:     General: Bowel sounds are normal.     Palpations: Abdomen is soft.  Genitourinary:    Penis: Normal.      Prostate: Normal.     Rectum: Normal.  Musculoskeletal:        General: Normal range of motion.     Cervical back: Normal range of motion and neck supple.  Skin:    General: Skin is warm and dry.     Comments: Left upper back with sebaceous cyst.  Neurological:     Mental Status:  He is alert and oriented to person, place, and time. Mental status is at baseline.     Cranial Nerves: No cranial nerve deficit.     Comments: Pt has difficulty with walking. He has a right foot drop. He has decreased sensation to both feet.  Psychiatric:        Mood and Affect: Mood normal.        Behavior: Behavior normal.        Thought Content: Thought content normal.        Judgment: Judgment normal.       No results found for any visits on 05/25/20.  Assessment & Plan     1. Sepsis, due to unspecified organism, unspecified whether acute organ dysfunction present ([place]) Clinically resolving.  Hydronephrosis and kidney stone being addressed by urology.  Again hydrate aggressively. - CBC with Differential/Platelet  2. Kidney stone   3. Hyperthyroidism  - TSH  4. Essential (primary) hypertension  - Comprehensive metabolic panel  5. Hyperglycemia  - Hemoglobin A1c   No follow-ups on file.         Doshie Maggi Cranford Mon, MD  [facility] [phone] (phone) [phone] (fax)  [place]

## 2020-05-25 ENCOUNTER — Ambulatory Visit (INDEPENDENT_AMBULATORY_CARE_PROVIDER_SITE_OTHER): Payer: Medicare HMO | Admitting: Family Medicine

## 2020-05-25 ENCOUNTER — Encounter: Payer: Self-pay | Admitting: Family Medicine

## 2020-05-25 ENCOUNTER — Other Ambulatory Visit: Payer: Self-pay

## 2020-05-25 VITALS — BP 117/61 | HR 73 | Temp 98.0°F | Resp 16 | Wt 163.0 lb

## 2020-05-25 DIAGNOSIS — R739 Hyperglycemia, unspecified: Secondary | ICD-10-CM

## 2020-05-25 DIAGNOSIS — N200 Calculus of kidney: Secondary | ICD-10-CM

## 2020-05-25 DIAGNOSIS — E0590 Thyrotoxicosis, unspecified without thyrotoxic crisis or storm: Secondary | ICD-10-CM | POA: Diagnosis not present

## 2020-05-25 DIAGNOSIS — I10 Essential (primary) hypertension: Secondary | ICD-10-CM | POA: Diagnosis not present

## 2020-05-25 DIAGNOSIS — A419 Sepsis, unspecified organism: Secondary | ICD-10-CM | POA: Diagnosis not present

## 2020-05-26 ENCOUNTER — Other Ambulatory Visit: Payer: Self-pay | Admitting: Family Medicine

## 2020-05-26 LAB — COMPREHENSIVE METABOLIC PANEL
ALT: 54 IU/L — ABNORMAL HIGH (ref 0–44)
AST: 29 IU/L (ref 0–40)
Albumin/Globulin Ratio: 0.9 — ABNORMAL LOW (ref 1.2–2.2)
Albumin: 3.5 g/dL — ABNORMAL LOW (ref 3.6–4.6)
Alkaline Phosphatase: 154 IU/L — ABNORMAL HIGH (ref 44–121)
BUN/Creatinine Ratio: 20 (ref 10–24)
BUN: 17 mg/dL (ref 8–27)
Bilirubin Total: 0.5 mg/dL (ref 0.0–1.2)
CO2: 24 mmol/L (ref 20–29)
Calcium: 9.4 mg/dL (ref 8.6–10.2)
Chloride: 101 mmol/L (ref 96–106)
Creatinine, Ser: 0.86 mg/dL (ref 0.76–1.27)
GFR calc Af Amer: 93 mL/min/{1.73_m2} (ref >59)
GFR calc non Af Amer: 80 mL/min/{1.73_m2} (ref >59)
Globulin, Total: 3.9 g/dL (ref 1.5–4.5)
Glucose: 125 mg/dL — ABNORMAL HIGH (ref 65–99)
Potassium: 4.3 mmol/L (ref 3.5–5.2)
Sodium: 138 mmol/L (ref 134–144)
Total Protein: 7.4 g/dL (ref 6.0–8.5)

## 2020-05-26 LAB — CBC WITH DIFFERENTIAL/PLATELET
Basophils Absolute: 0.1 10*3/uL (ref 0.0–0.2)
Basos: 1 %
EOS (ABSOLUTE): 0.1 10*3/uL (ref 0.0–0.4)
Eos: 2 %
Hematocrit: 40.1 % (ref 37.5–51.0)
Hemoglobin: 13.2 g/dL (ref 13.0–17.7)
Immature Grans (Abs): 0 10*3/uL (ref 0.0–0.1)
Immature Granulocytes: 0 %
Lymphocytes Absolute: 2.1 10*3/uL (ref 0.7–3.1)
Lymphs: 38 %
MCH: 30.7 pg (ref 26.6–33.0)
MCHC: 32.9 g/dL (ref 31.5–35.7)
MCV: 93 fL (ref 79–97)
Monocytes Absolute: 0.4 10*3/uL (ref 0.1–0.9)
Monocytes: 7 %
Neutrophils Absolute: 2.9 10*3/uL (ref 1.4–7.0)
Neutrophils: 52 %
Platelets: 303 10*3/uL (ref 150–450)
RBC: 4.3 x10E6/uL (ref 4.14–5.80)
RDW: 12.4 % (ref 11.6–15.4)
WBC: 5.5 10*3/uL (ref 3.4–10.8)

## 2020-05-26 LAB — HEMOGLOBIN A1C
Est. average glucose Bld gHb Est-mCnc: 148 mg/dL
Hgb A1c MFr Bld: 6.8 % — ABNORMAL HIGH (ref 4.8–5.6)

## 2020-05-26 LAB — TSH: TSH: 0.571 u[IU]/mL (ref 0.450–4.500)

## 2020-06-05 ENCOUNTER — Other Ambulatory Visit: Payer: Self-pay

## 2020-06-05 ENCOUNTER — Other Ambulatory Visit: Payer: Medicare HMO

## 2020-06-05 DIAGNOSIS — N200 Calculus of kidney: Secondary | ICD-10-CM

## 2020-06-06 LAB — MICROSCOPIC EXAMINATION: Bacteria, UA: NONE SEEN

## 2020-06-06 LAB — URINALYSIS, COMPLETE
Bilirubin, UA: NEGATIVE
Glucose, UA: NEGATIVE
Ketones, UA: NEGATIVE
Nitrite, UA: NEGATIVE
Protein,UA: NEGATIVE
Specific Gravity, UA: 1.015 (ref 1.005–1.030)
Urobilinogen, Ur: 0.2 mg/dL (ref 0.2–1.0)
pH, UA: 5 (ref 5.0–7.5)

## 2020-06-08 ENCOUNTER — Telehealth: Payer: Self-pay

## 2020-06-08 DIAGNOSIS — B952 Enterococcus as the cause of diseases classified elsewhere: Secondary | ICD-10-CM

## 2020-06-08 LAB — CULTURE, URINE COMPREHENSIVE

## 2020-06-08 MED ORDER — NITROFURANTOIN MONOHYD MACRO 100 MG PO CAPS: 100.0000 mg | ORAL_CAPSULE | Freq: Two times a day (BID) | ORAL | 0 refills | Status: AC

## 2020-06-08 NOTE — Telephone Encounter (Signed)
Called pt informed him of the information below. Pt gave verbal understanding. RX sent.  

## 2020-06-08 NOTE — Telephone Encounter (Signed)
-----   Message from Sondra Come, MD sent at 06/08/2020 11:50 AM EST ----- Lets do nitrocfurantoin 100mg  BID x 3 days to stetrilize urine before his surgery tmrw, should take 2 doses today and one tmrw AM, thanks  , MD 06/08/2020

## 2020-06-12 ENCOUNTER — Other Ambulatory Visit
Admission: RE | Admit: 2020-06-12 | Discharge: 2020-06-12 | Disposition: A | Discharge status: Home / Self Care | Payer: Medicare HMO | Source: Ambulatory Visit | Attending: Urology | Admitting: Urology

## 2020-06-12 ENCOUNTER — Other Ambulatory Visit: Payer: Self-pay

## 2020-06-12 ENCOUNTER — Other Ambulatory Visit: Payer: Self-pay | Admitting: Urology

## 2020-06-12 DIAGNOSIS — N200 Calculus of kidney: Secondary | ICD-10-CM

## 2020-06-12 HISTORY — DX: Other complications of anesthesia, initial encounter: T88.59XA

## 2020-06-12 NOTE — Patient Instructions (Addendum)
Your procedure is scheduled on: Friday 06/16/20.  Report to THE FIRST FLOOR REGISTRATION DESK IN THE MEDICAL MALL ON THE MORNING OF SURGERY FIRST, THEN YOU WILL CHECK IN AT THE SURGERY INFORMATION DESK LOCATED OUTSIDE THE SAME DAY SURGERY DEPARTMENT LOCATED ON 2ND FLOOR MEDICAL MALL ENTRANCE.  To find out your arrival time please call [phone] between 1PM - 3PM on Thursday 06/15/20.   Remember: Instructions that are not followed completely may result in serious medical risk, up to and including death, or upon the discretion of your surgeon and anesthesiologist your surgery may need to be rescheduled.    __X__ 1. No food or drink after midnight the night before your procedure.  __X__2.  On the morning of surgery brush your teeth with toothpaste and water, you may rinse your mouth with mouthwash if you wish.  Do not swallow any toothpaste or mouthwash.    __X__ 3.  No Alcohol for 24 hours before or after surgery.  __X__ 4.  Do Not Smoke or use e-cigarettes For 24 Hours Prior to Your Surgery.                 Do not use any chewable tobacco products for at least 6 hours prior to                 surgery.  __X__5.  Notify your doctor if there is any change in your medical condition      (cold, fever, infections).      Do NOT wear jewelry, make-up, hairpins, clips or nail polish. Do NOT wear lotions, powders, or perfumes.  Do NOT shave 48 hours prior to surgery. Men may shave face and neck. Do NOT bring valuables to the hospital.     [facility] is not responsible for any belongings or valuables.   Contacts, dentures/partials or body piercings may not be worn into surgery. Bring a case for your contacts, glasses or hearing aids, a denture cup will be supplied.   Patients discharged the day of surgery will not be allowed to drive home.     __X__ Take these medicines the morning of surgery with A SIP OF WATER:     1. gabapentin (NEURONTIN)   2. tamsulosin (FLOMAX)     __X__   Shower before arrival to the hospital.  __X__ Stop Blood Thinners: Aspirin. Ask Dr.Gilbert when you can stop your Aspirin.  __X__ Stop Anti-inflammatories 7 days before surgery such as Advil, Ibuprofen, Motrin, BC or Goodies Powder, Naprosyn, Naproxen, Aleve, Aspirin, Meloxicam. May take Tylenol if needed for pain or discomfort.   __X__Do not start taking any new herbal supplements or vitamins prior to your procedure.  __X__ Stop the following herbal supplements or vitamins:  ascorbic acid (VITAMIN C)    Wear comfortable clothing (specific to your surgery type) to the hospital.  Plan for stool softeners for home use; pain medications have a tendency to cause constipation. You can also help prevent constipation by eating foods high in fiber such as fruits and vegetables and drinking plenty of fluids as your diet allows.  After surgery, you can prevent lung complications by doing breathing exercises.Take deep breaths and cough every 1-2 hours. Your doctor may order a device called an Incentive Spirometer to help you take deep breaths.  Please call the [place] Department at [phone] if you have any questions about these instructions.

## 2020-06-13 ENCOUNTER — Telehealth: Payer: Self-pay

## 2020-06-13 DIAGNOSIS — I442 Atrioventricular block, complete: Secondary | ICD-10-CM | POA: Diagnosis not present

## 2020-06-13 NOTE — Telephone Encounter (Signed)
Yes,5 days before and can start back 3 days after.

## 2020-06-13 NOTE — Telephone Encounter (Signed)
Mr. Davidian advised.   Thanks,   -Mickel Baas

## 2020-06-13 NOTE — Telephone Encounter (Signed)
Patient left a voicemail requesting you return his call

## 2020-06-13 NOTE — Telephone Encounter (Signed)
Copied from [place] [id number]. Topic: General - Other >> Jun 12, 2020  1:25 PM Rainey Pines A wrote: Patient stated that [facility] advised him to find out if Dr. Rosanna Randy would advise patient to stop taking his aspirin before his upcoming procedure on 1/14 to have stones removed. Please advise

## 2020-06-14 ENCOUNTER — Other Ambulatory Visit: Payer: Self-pay

## 2020-06-14 ENCOUNTER — Encounter
Admission: RE | Admit: 2020-06-14 | Discharge: 2020-06-14 | Disposition: A | Discharge status: Home / Self Care | Payer: Medicare HMO | Source: Ambulatory Visit | Attending: Urology | Admitting: Urology

## 2020-06-14 DIAGNOSIS — Z01818 Encounter for other preprocedural examination: Secondary | ICD-10-CM | POA: Diagnosis not present

## 2020-06-14 DIAGNOSIS — I10 Essential (primary) hypertension: Secondary | ICD-10-CM | POA: Insufficient documentation

## 2020-06-14 DIAGNOSIS — Z136 Encounter for screening for cardiovascular disorders: Secondary | ICD-10-CM | POA: Diagnosis not present

## 2020-06-14 DIAGNOSIS — Z20822 Contact with and (suspected) exposure to covid-19: Secondary | ICD-10-CM | POA: Insufficient documentation

## 2020-06-14 LAB — CBC
HCT: 41.3 % (ref 39.0–52.0)
Hemoglobin: 13.2 g/dL (ref 13.0–17.0)
MCH: 31 pg (ref 26.0–34.0)
MCHC: 32 g/dL (ref 30.0–36.0)
MCV: 96.9 fL (ref 80.0–100.0)
Platelets: 162 10*3/uL (ref 150–400)
RBC: 4.26 MIL/uL (ref 4.22–5.81)
RDW: 14.6 % (ref 11.5–15.5)
WBC: 5.2 10*3/uL (ref 4.0–10.5)
nRBC: 0 % (ref 0.0–0.2)

## 2020-06-14 LAB — BASIC METABOLIC PANEL
Anion gap: 9 (ref 5–15)
BUN: 14 mg/dL (ref 8–23)
CO2: 26 mmol/L (ref 22–32)
Calcium: 9 mg/dL (ref 8.9–10.3)
Chloride: 105 mmol/L (ref 98–111)
Creatinine, Ser: 0.79 mg/dL (ref 0.61–1.24)
GFR, Estimated: >60 mL/min (ref >60)
Glucose, Bld: 143 mg/dL — ABNORMAL HIGH (ref 70–99)
Potassium: 3.6 mmol/L (ref 3.5–5.1)
Sodium: 140 mmol/L (ref 135–145)

## 2020-06-14 NOTE — Progress Notes (Signed)
[facility] Perioperative Services  Pre-Admission/Anesthesia Testing Clinical Review  Date: 06/14/20  Patient Demographics:  Name: Todd Ortega DOB:   17-May-1935 MRN:   [id number]  Planned Surgical Procedure(s):    Case: [id number] Date/Time: 06/16/20 0927   Procedure: CYSTOSCOPY/URETEROSCOPY/HOLMIUM LASER/STENT EXCHANGE (Right )   Anesthesia type: Choice   Pre-op diagnosis: rIGHT URETERAL STONE   Location: [facility] OR ROOM 10 / [place] ORS FOR ANESTHESIA GROUP   Surgeons: Billey Co, MD    NOTE: Available PAT nursing documentation and vital signs have been reviewed. Clinical nursing staff has updated patient's PMH/PSHx, current medication list, and drug allergies/intolerances to ensure comprehensive history available to assist in medical decision making as it pertains to the aforementioned surgical procedure and anticipated anesthetic course.   Clinical Discussion:  Todd Ortega is a 86 y.o. male who is submitted for pre-surgical anesthesia review and clearance prior to him undergoing the above procedure. Patient has never been a smoker. Pertinent PMH includes: CAD, complete heart block (s/p PPM placement), HTN, HLD, T2DM, DOE, OSAH (does NOT use nocturnal PAP therapy), hyperthyroidism, CKD, OSA.  Patient is followed by cardiology Clayborn Bigness, MD). He was last seen in the cardiology clinic on 12/20/2019; notes reviewed.  At the time of his clinic visit, patient doing well overall from a cardiovascular perspective.  He denied any chest pain, shortness of breath, PND, orthopnea, palpitations, peripheral edema, vertiginous symptoms, or presyncope/syncope.  Patient with PMH significant for sick sinus syndrome and complete heart block; has PPM in place and was compliant with routine transmissions via [facility] patient had no defined exercise routine, however maintained a part-time job allowing him to remain physically active. Functional capacity, as defined by DASI, is documented as  being >/= 4 METS.  Last TTE performed on 04/18/2015 revealed normal left ventricular systolic function with mild valvular insufficiency and moderate apical hypertrophy; LVEF >55% (see full interpretation of cardiovascular testing below).  Patient is on GDMT for his HTN and HLD diagnoses.  Blood pressure documented at 118/64 on currently prescribed ACEi monotherapy.  Patient is on a statin for his HLD.  T2DM being controlled with diet lifestyle modifications alone; last Hgb A1c was 6.3% on 08/16/2019.  No changes were made to patient's medication regimen during this visit.  Patient to follow-up with outpatient cardiology in 6 months or sooner if needed.  Patient admitted to [facility] from 05/13/2020 through 05/17/2020 for septic shock secondary to obstructive uropathy; notes reviewed.  He presented to the ED febrile at 100.3; documented T-max in the ED 101.  He was found to be normotensive with a normal heart rate.  Initial lab work-up revealed leukocytosis; WBC 14.9 K/uL.  BUN elevated at 35 with a creatinine of 2.22 mg/dL.  UA positive for infection; reflex C&S grew out >100,000 CFU/mL Klebsiella oxytoca.  Two sets of blood cultures were also (+) for the same organism.  Lactic acid significantly elevated at 4.0 mmol/L.  CT imaging of the abdomen pelvis revealed an obstructing 5 x 7 urolithiasis at the right UVJ with moderate hydronephrosis and prominent hydroureter; there was moderate perinephric edema.  Urology was consulted and the decision was made to take patient for urgent cystoscopy and stent placement.  Patient treated in the ED with IV fluids and broad-spectrum antibiotics.  He was admitted to the hospital for ongoing management.   Patient taken to the OR for emergency cystoscopy and partial laser lithotripsy on 05/13/2020.  Ureteral stent was placed.  Patient hypotensive postoperatively with a documented blood pressure 75/49.  Patient was placed on vasopressors and remained intubated.  He was discharged  from the PACU directly to ICU.   Patient was seen in consult during his ICU admission by PCCM Patsey Berthold, MD).  TTE was ordered in the setting of persistent lactic acidosis.  TTE revealed normal left ventricular systolic function with no RWMAs; LVEF 65 to 70%   Patient continued to improve throughout his ICU course.  Vasopressors were discontinued and patient was extubated.  He was transferred to a general medical/surgical floor.   Infectious disease provider Delaine Lame, MD) was also consulted due to patient's urine and blood cultures being positive for Klebsiella oxytoca.  At the time of her consult, patient was being treated with intravenous ceftriaxone.  MD recommended 14-day course of antibiotics.   Patient ultimately discharged home on 05/17/2020 in stable condition with instructions to follow-up with outpatient urology for continuing management.  Infectious disease recommended changing patient over to oral antibiotics as an outpatient.  Flora quinolone was recommended.  Patient was discharged home on a 9-day course of levofloxacin.  Patient is scheduled to undergo repeat cystoscopy with stent exchange on 06/16/2020 with Dr. Nickolas Madrid.  Given patient's past medical history significant for cardiovascular disease, coupled with his recent hospital admission for severe urosepsis, presurgical cardiac clearance was sought by the PAT team. Per cardiology, "this patient is optimized for surgery and may proceed with the planned procedural course with a LOW risk stratification". This patient is on daily antiplatelet therapy.  He has been advised on recommendation from cardiology for holding his daily low-dose ASA for 7 days prior to his procedure with plans to resume 2 days postoperatively or when deemed appropriate to do so by his surgeon.  The patient's last dose of ASA will be on 06/08/2020.  He reports previous perioperative complications with anesthesia. He reports a (+) history of delayed  emergence. He underwent a general anesthetic course here (ASA III) on 05/13/2020 when he, in the setting of severe urosepsis, patient developed postoperative respiratory failure and hypotension requiring mechanical ventilation and vasopressors.  Vitals with BMI 06/12/2020 05/25/2020 05/17/2020  Height - - -  Weight 163 lbs 163 lbs -  BMI - 123456 -  Systolic - [id number]  Diastolic - 61 76  Pulse - 73 77    Providers/Specialists:   NOTE: Primary physician provider listed below. Patient may have been seen by APP or partner within same practice.   PROVIDER ROLE / SPECIALTY LAST Ranae Pila, MD Urology (Surgeon)  05/15/2020  [name]., MD Primary Care Provider  05/25/2020  Katrine Coho, MD Cardiology  12/20/2019  Lyn Henri, MD Infectious Disease  05/16/2020  Vernard Gambles, MD Pulmonary Medicine  05/14/2020   Allergies:  Venlafaxine  Current Home Medications:   No current facility-administered medications for this encounter.   Marland Kitchen acetaminophen (TYLENOL) 650 MG CR tablet  . ascorbic acid (VITAMIN C) 500 MG tablet  . aspirin EC 81 MG tablet  . Calcium Carb-Cholecalciferol (CALCIUM 500+D3 PO)  . finasteride (PROSCAR) 5 MG tablet  . gabapentin (NEURONTIN) 300 MG capsule  . lisinopril (PRINIVIL,ZESTRIL) 10 MG tablet  . loratadine (CLARITIN) 10 MG tablet  . simvastatin (ZOCOR) 40 MG tablet  . tamsulosin (FLOMAX) 0.4 MG CAPS capsule  . glucose blood test strip  . prednisoLONE acetate (PRED FORTE) 1 % ophthalmic suspension   History:   Past Medical History:  Diagnosis Date  . Arthritis   . BPH (benign prostatic hypertrophy)   . Chronic kidney  disease   . Complete heart block ([place])   . Complication of anesthesia    Hard to wake.  . Coronary artery disease   . Diabetes mellitus without complication ([place])   . Foot drop, right foot   . Hyperlipidemia   . Hypertension   . Hyperthyroidism   . Nephrolithiasis   . Neuropathy   . Shortness of  breath dyspnea   . Sleep apnea    Does not use CPAP   Past Surgical History:  Procedure Laterality Date  . CARDIAC CATHETERIZATION    . CYSTOSCOPY WITH STENT PLACEMENT Right 05/13/2020   Procedure: CYSTOSCOPY RETROGRADE LASER  WITH STENT PLACEMENT RIGHT;  Surgeon: Billey Co, MD;  Location: [facility] ORS;  Service: Urology;  Laterality: Right;  . EYE SURGERY    . HERNIA REPAIR Bilateral    Inguinal Hernia Repair  . MASS EXCISION     removed from left hand  . PACEMAKER INSERTION N/A 04/20/2015   Procedure: INSERTION PACEMAKER;  Surgeon: Isaias Cowman, MD;  Location: [facility] ORS;  Service: Cardiovascular;  Laterality: N/A;   Family History  Problem Relation Age of Onset  . Heart attack Mother   . Gout Mother   . Diabetes Mother   . Hypertension Mother   . Hyperlipidemia Mother   . Lung cancer Father   . Drug abuse Brother    Social History   Tobacco Use  . Smoking status: Never Smoker  . Smokeless tobacco: Never Used  Vaping Use  . Vaping Use: Never used  Substance Use Topics  . Alcohol use: No    Alcohol/week: 0.0 standard drinks  . Drug use: No    Pertinent Clinical Results:  LABS: Labs reviewed: Acceptable for surgery.  Hospital Outpatient Visit on 06/14/2020  Component Date Value Ref Range Status  . WBC 06/14/2020 5.2  4.0 - 10.5 K/uL Final  . RBC 06/14/2020 4.26  4.22 - 5.81 MIL/uL Final  . Hemoglobin 06/14/2020 13.2  13.0 - 17.0 g/dL Final  . HCT 06/14/2020 41.3  39.0 - 52.0 % Final  . MCV 06/14/2020 96.9  80.0 - 100.0 fL Final  . MCH 06/14/2020 31.0  26.0 - 34.0 pg Final  . MCHC 06/14/2020 32.0  30.0 - 36.0 g/dL Final  . RDW 06/14/2020 14.6  11.5 - 15.5 % Final  . Platelets 06/14/2020 162  150 - 400 K/uL Final  . nRBC 06/14/2020 0.0  0.0 - 0.2 % Final   Performed at [facility], [address] [place], [place] [address]  . Sodium 06/14/2020 140  135 - 145 mmol/L Final  . Potassium 06/14/2020 3.6  3.5 - 5.1 mmol/L Final  . Chloride  06/14/2020 105  98 - 111 mmol/L Final  . CO2 06/14/2020 26  22 - 32 mmol/L Final  . Glucose, Bld 06/14/2020 143* 70 - 99 mg/dL Final   Glucose reference range applies only to samples taken after fasting for at least 8 hours.  . BUN 06/14/2020 14  8 - 23 mg/dL Final  . Creatinine, Ser 06/14/2020 0.79  0.61 - 1.24 mg/dL Final  . Calcium 06/14/2020 9.0  8.9 - 10.3 mg/dL Final  . GFR, Estimated 06/14/2020 >60  >60 mL/min Final   Comment: (NOTE) Calculated using the CKD-EPI Creatinine Equation (2021)   . Anion gap 06/14/2020 9  5 - 15 Final   Performed at [facility], [address] [place], [place] [address]    ECG: Date: 06/14/2020 Time ECG obtained: 1015 AM Rate: 62  bpm Rhythm: Atrial sensed ventricular paced with prolonged AV conduction Intervals: PR 212 ms. QRS 194 ms. QTc 493 ms. ST segment and T wave changes: No evidence of acute ST segment elevation or depression Comparison: Similar to previous tracing obtained on 04/21/2015   IMAGING / PROCEDURES: RENAL ULTRASOUND performed on 05/16/2020 1. Mild right hydronephrosis again noted 2. Right ureteral stent noted 3. 1.2 cm stone again noted in the distal right ureter 4. 7 mm calyceal stone in the right kidney 5. 5 mm calyceal stone in the left kidney 6. No left hydronephrosis 7. Bladder is non distended 8. Prostate enlargement is again noted  ECHOCARDIOGRAM performed on 05/15/2020 1. Left ventricular ejection fraction, by estimation, is 65 to 70%.  2. The left ventricle has normal function.  3. The left ventricle has no regional wall motion abnormalities.  4. Left ventricular diastolic parameters were normal.  5. Right ventricular systolic function is normal.  6. The right ventricular size is normal.  7. The mitral valve is normal in structure. No evidence of mitral valve regurgitation.  8. The aortic valve is normal in structure. Aortic valve regurgitation is not visualized.   DIAGNOSTIC RADIOGRAPHS CHEST  PORTABLE 1 VIEW performed on 05/14/2020 1. Endotracheal tube is in good position 2. NG tube is within the stomach 3. Potential left lower lobe pneumonia  CT ABDOMEN PELVIS WO CONTRAST performed on 05/13/2020 1. Obstructing 5 x 7 mm stone at the right ureterovesicular junction with moderate hydronephrosis and prominent hydroureter 2. Moderate right perinephric edema 3. Additional non obstructing stones in both kidneys 4. Colonic diverticulosis without diverticulitis 5. Cholelithiasis without gallbladder inflammation 6. Enlarged prostate gland causing mass-effect on the bladder base  Impression and Plan:  Todd Ortega has been referred for pre-anesthesia review and clearance prior to him undergoing the planned anesthetic and procedural courses. Available labs, pertinent testing, and imaging results were personally reviewed by me. This patient has been appropriately cleared by cardiology at an overall LOW risk of serious perioperative cardiovascular complications.  Patient has PPM in place.  Perioperative prescription for implanted cardiac device program form completed and placed on patient's chart for review by surgical/anesthetic team.  Based on clinical review performed today (06/14/20), barring any significant acute changes in the patient's overall condition, it is anticipated that he will be able to proceed with the planned surgical intervention. Any acute changes in clinical condition may necessitate his procedure being postponed and/or cancelled. Pre-surgical instructions were reviewed with the patient during his PAT appointment and questions were fielded by PAT clinical staff.  Honor Loh, MSN, APRN, FNP-C, CEN [facility]  Peri-operative Services Nurse Practitioner Phone: [phone] 06/14/20 12:35 PM  NOTE: This note has been prepared using Dragon dictation software. Despite my best ability to proofread, there is always the potential that unintentional  transcriptional errors may still occur from this process.

## 2020-06-15 LAB — SARS CORONAVIRUS 2 (TAT 6-24 HRS): SARS Coronavirus 2: NEGATIVE

## 2020-06-16 ENCOUNTER — Encounter
Admission: RE | Disposition: A | Discharge status: Home / Self Care | Payer: Self-pay | Source: Home / Self Care | Attending: Urology

## 2020-06-16 ENCOUNTER — Other Ambulatory Visit: Payer: Self-pay

## 2020-06-16 ENCOUNTER — Ambulatory Visit: Payer: Medicare HMO | Admitting: Urgent Care

## 2020-06-16 ENCOUNTER — Ambulatory Visit: Payer: Medicare HMO

## 2020-06-16 ENCOUNTER — Ambulatory Visit
Admission: RE | Admit: 2020-06-16 | Discharge: 2020-06-16 | Disposition: A | Discharge status: Home / Self Care | Payer: Medicare HMO | Attending: Urology | Admitting: Urology

## 2020-06-16 ENCOUNTER — Encounter: Payer: Self-pay | Admitting: Urology

## 2020-06-16 DIAGNOSIS — E1122 Type 2 diabetes mellitus with diabetic chronic kidney disease: Secondary | ICD-10-CM | POA: Diagnosis not present

## 2020-06-16 DIAGNOSIS — Z8249 Family history of ischemic heart disease and other diseases of the circulatory system: Secondary | ICD-10-CM | POA: Insufficient documentation

## 2020-06-16 DIAGNOSIS — E785 Hyperlipidemia, unspecified: Secondary | ICD-10-CM | POA: Diagnosis not present

## 2020-06-16 DIAGNOSIS — Z466 Encounter for fitting and adjustment of urinary device: Secondary | ICD-10-CM | POA: Diagnosis not present

## 2020-06-16 DIAGNOSIS — Z801 Family history of malignant neoplasm of trachea, bronchus and lung: Secondary | ICD-10-CM | POA: Diagnosis not present

## 2020-06-16 DIAGNOSIS — N189 Chronic kidney disease, unspecified: Secondary | ICD-10-CM | POA: Insufficient documentation

## 2020-06-16 DIAGNOSIS — Z87442 Personal history of urinary calculi: Secondary | ICD-10-CM | POA: Insufficient documentation

## 2020-06-16 DIAGNOSIS — R0602 Shortness of breath: Secondary | ICD-10-CM | POA: Diagnosis not present

## 2020-06-16 DIAGNOSIS — G4730 Sleep apnea, unspecified: Secondary | ICD-10-CM | POA: Diagnosis not present

## 2020-06-16 DIAGNOSIS — Z8349 Family history of other endocrine, nutritional and metabolic diseases: Secondary | ICD-10-CM | POA: Diagnosis not present

## 2020-06-16 DIAGNOSIS — Z7982 Long term (current) use of aspirin: Secondary | ICD-10-CM | POA: Diagnosis not present

## 2020-06-16 DIAGNOSIS — I2510 Atherosclerotic heart disease of native coronary artery without angina pectoris: Secondary | ICD-10-CM | POA: Insufficient documentation

## 2020-06-16 DIAGNOSIS — E1140 Type 2 diabetes mellitus with diabetic neuropathy, unspecified: Secondary | ICD-10-CM | POA: Diagnosis not present

## 2020-06-16 DIAGNOSIS — Z950 Presence of cardiac pacemaker: Secondary | ICD-10-CM | POA: Diagnosis not present

## 2020-06-16 DIAGNOSIS — N400 Enlarged prostate without lower urinary tract symptoms: Secondary | ICD-10-CM | POA: Diagnosis not present

## 2020-06-16 DIAGNOSIS — Z833 Family history of diabetes mellitus: Secondary | ICD-10-CM | POA: Insufficient documentation

## 2020-06-16 DIAGNOSIS — Z79899 Other long term (current) drug therapy: Secondary | ICD-10-CM | POA: Insufficient documentation

## 2020-06-16 DIAGNOSIS — N201 Calculus of ureter: Secondary | ICD-10-CM | POA: Diagnosis not present

## 2020-06-16 DIAGNOSIS — I129 Hypertensive chronic kidney disease with stage 1 through stage 4 chronic kidney disease, or unspecified chronic kidney disease: Secondary | ICD-10-CM | POA: Diagnosis not present

## 2020-06-16 DIAGNOSIS — I442 Atrioventricular block, complete: Secondary | ICD-10-CM | POA: Insufficient documentation

## 2020-06-16 DIAGNOSIS — N132 Hydronephrosis with renal and ureteral calculous obstruction: Secondary | ICD-10-CM | POA: Insufficient documentation

## 2020-06-16 DIAGNOSIS — N200 Calculus of kidney: Secondary | ICD-10-CM

## 2020-06-16 HISTORY — PX: CYSTOSCOPY W/ RETROGRADES: SHX1426

## 2020-06-16 HISTORY — PX: CYSTOSCOPY/URETEROSCOPY/HOLMIUM LASER/STENT PLACEMENT: SHX6546

## 2020-06-16 LAB — GLUCOSE, CAPILLARY: Glucose-Capillary: 177 mg/dL — ABNORMAL HIGH (ref 70–99)

## 2020-06-16 SURGERY — CYSTOSCOPY/URETEROSCOPY/HOLMIUM LASER/STENT PLACEMENT
Anesthesia: General | Site: Ureter | Laterality: Right

## 2020-06-16 MED ORDER — NITROFURANTOIN MACROCRYSTAL 100 MG PO CAPS: 100.0000 mg | ORAL_CAPSULE | Freq: Every day | ORAL | 0 refills | Status: AC

## 2020-06-16 MED ORDER — LIDOCAINE HCL (CARDIAC) PF 100 MG/5ML IV SOSY
PREFILLED_SYRINGE | INTRAVENOUS | Status: DC | PRN
  Administered 2020-06-16: 100 mg via INTRAVENOUS

## 2020-06-16 MED ORDER — CHLORHEXIDINE GLUCONATE 0.12 % MT SOLN: 15.0000 mL | Freq: Once | OROMUCOSAL | Status: AC

## 2020-06-16 MED ORDER — FENTANYL CITRATE (PF) 100 MCG/2ML IJ SOLN
INTRAMUSCULAR | Status: AC
  Filled 2020-06-16: qty 2

## 2020-06-16 MED ORDER — VANCOMYCIN HCL IN DEXTROSE 1-5 GM/200ML-% IV SOLN
INTRAVENOUS | Status: AC
  Administered 2020-06-16: 1000 mg via INTRAVENOUS
  Filled 2020-06-16: qty 200

## 2020-06-16 MED ORDER — CEFAZOLIN SODIUM-DEXTROSE 2-4 GM/100ML-% IV SOLN
INTRAVENOUS | Status: AC
  Filled 2020-06-16: qty 100

## 2020-06-16 MED ORDER — HYDROMORPHONE HCL 1 MG/ML IJ SOLN: 0.2500 mg | INTRAMUSCULAR | Status: DC | PRN

## 2020-06-16 MED ORDER — SUGAMMADEX SODIUM 200 MG/2ML IV SOLN
INTRAVENOUS | Status: DC | PRN
  Administered 2020-06-16: 200 mg via INTRAVENOUS

## 2020-06-16 MED ORDER — ONDANSETRON HCL 4 MG/2ML IJ SOLN
INTRAMUSCULAR | Status: DC | PRN
  Administered 2020-06-16: 4 mg via INTRAVENOUS

## 2020-06-16 MED ORDER — PROPOFOL 10 MG/ML IV BOLUS
INTRAVENOUS | Status: DC | PRN
  Administered 2020-06-16: 120 mg via INTRAVENOUS

## 2020-06-16 MED ORDER — FAMOTIDINE 20 MG PO TABS
ORAL_TABLET | ORAL | Status: AC
  Administered 2020-06-16: 20 mg
  Filled 2020-06-16: qty 1

## 2020-06-16 MED ORDER — IOHEXOL 180 MG/ML  SOLN
INTRAMUSCULAR | Status: DC | PRN
  Administered 2020-06-16: 10 mL

## 2020-06-16 MED ORDER — PROMETHAZINE HCL 25 MG/ML IJ SOLN: 6.2500 mg | INTRAMUSCULAR | Status: DC | PRN

## 2020-06-16 MED ORDER — LORAZEPAM 2 MG/ML IJ SOLN: 1.0000 mg | Freq: Once | INTRAMUSCULAR | Status: DC | PRN

## 2020-06-16 MED ORDER — OXYCODONE HCL 5 MG PO TABS: 5.0000 mg | ORAL_TABLET | Freq: Once | ORAL | Status: DC | PRN

## 2020-06-16 MED ORDER — CEFAZOLIN SODIUM-DEXTROSE 2-4 GM/100ML-% IV SOLN
2.0000 g | INTRAVENOUS | Status: AC
  Administered 2020-06-16: 2 g via INTRAVENOUS

## 2020-06-16 MED ORDER — VANCOMYCIN HCL IN DEXTROSE 1-5 GM/200ML-% IV SOLN: 1000.0000 mg | Freq: Once | INTRAVENOUS | Status: AC

## 2020-06-16 MED ORDER — FENTANYL CITRATE (PF) 100 MCG/2ML IJ SOLN
INTRAMUSCULAR | Status: DC | PRN
  Administered 2020-06-16: 25 ug via INTRAVENOUS
  Administered 2020-06-16: 2 ug via INTRAVENOUS

## 2020-06-16 MED ORDER — DROPERIDOL 2.5 MG/ML IJ SOLN
0.6250 mg | Freq: Once | INTRAMUSCULAR | Status: DC | PRN
  Filled 2020-06-16: qty 2

## 2020-06-16 MED ORDER — OXYCODONE HCL 5 MG/5ML PO SOLN: 5.0000 mg | Freq: Once | ORAL | Status: DC | PRN

## 2020-06-16 MED ORDER — MEPERIDINE HCL 50 MG/ML IJ SOLN: 6.2500 mg | INTRAMUSCULAR | Status: DC | PRN

## 2020-06-16 MED ORDER — SUCCINYLCHOLINE CHLORIDE 20 MG/ML IJ SOLN
INTRAMUSCULAR | Status: DC | PRN
  Administered 2020-06-16: 100 mg via INTRAVENOUS

## 2020-06-16 MED ORDER — HYDROCODONE-ACETAMINOPHEN 5-325 MG PO TABS: 1.0000 | ORAL_TABLET | Freq: Four times a day (QID) | ORAL | 0 refills | Status: AC | PRN

## 2020-06-16 MED ORDER — ORAL CARE MOUTH RINSE: 15.0000 mL | Freq: Once | OROMUCOSAL | Status: AC

## 2020-06-16 MED ORDER — LACTATED RINGERS IV SOLN: INTRAVENOUS | Status: DC

## 2020-06-16 MED ORDER — PHENYLEPHRINE HCL (PRESSORS) 10 MG/ML IV SOLN
INTRAVENOUS | Status: DC | PRN
  Administered 2020-06-16: 100 ug via INTRAVENOUS

## 2020-06-16 MED ORDER — ROCURONIUM BROMIDE 100 MG/10ML IV SOLN
INTRAVENOUS | Status: DC | PRN
  Administered 2020-06-16: 35 mg via INTRAVENOUS
  Administered 2020-06-16: 5 mg via INTRAVENOUS

## 2020-06-16 MED ORDER — DEXAMETHASONE SODIUM PHOSPHATE 10 MG/ML IJ SOLN
INTRAMUSCULAR | Status: DC | PRN
  Administered 2020-06-16: 10 mg via INTRAVENOUS

## 2020-06-16 MED ORDER — SODIUM CHLORIDE 0.9 % IV SOLN
INTRAVENOUS | Status: DC | PRN
  Administered 2020-06-16: 50 ug/min via INTRAVENOUS

## 2020-06-16 MED ORDER — CHLORHEXIDINE GLUCONATE 0.12 % MT SOLN
OROMUCOSAL | Status: AC
  Administered 2020-06-16: 15 mL via OROMUCOSAL
  Filled 2020-06-16: qty 15

## 2020-06-16 SURGICAL SUPPLY — 34 items
BAG DRAIN CYSTO-URO LG1000N (MISCELLANEOUS) ×2 IMPLANT
BASKET ZERO TIP 1.9FR (BASKET) ×2 IMPLANT
BRUSH SCRUB EZ 1% IODOPHOR (MISCELLANEOUS) ×2 IMPLANT
BSKT STON RTRVL ZERO TP 1.9FR (BASKET) ×1
CATH URETL 5X70 OPEN END (CATHETERS) ×2 IMPLANT
CNTNR SPEC 2.5X3XGRAD LEK (MISCELLANEOUS)
CONT SPEC 4OZ STER OR WHT (MISCELLANEOUS)
CONT SPEC 4OZ STRL OR WHT (MISCELLANEOUS)
CONTAINER SPEC 2.5X3XGRAD LEK (MISCELLANEOUS) IMPLANT
DRAPE UTILITY 15X26 TOWEL STRL (DRAPES) ×2 IMPLANT
DRSG TEGADERM 2-3/8X2-3/4 SM (GAUZE/BANDAGES/DRESSINGS) ×2 IMPLANT
GLOVE BIOGEL PI IND STRL 7.5 (GLOVE) ×1 IMPLANT
GLOVE BIOGEL PI INDICATOR 7.5 (GLOVE) ×1
GOWN STRL REUS W/ TWL LRG LVL3 (GOWN DISPOSABLE) ×1 IMPLANT
GOWN STRL REUS W/ TWL XL LVL3 (GOWN DISPOSABLE) ×1 IMPLANT
GOWN STRL REUS W/TWL LRG LVL3 (GOWN DISPOSABLE) ×2
GOWN STRL REUS W/TWL XL LVL3 (GOWN DISPOSABLE) ×2
GUIDEWIRE STR DUAL SENSOR (WIRE) ×4 IMPLANT
INFUSOR MANOMETER BAG 3000ML (MISCELLANEOUS) ×2 IMPLANT
INTRODUCER DILATOR DOUBLE (INTRODUCER) IMPLANT
KIT TURNOVER CYSTO (KITS) ×2 IMPLANT
PACK CYSTO AR (MISCELLANEOUS) ×2 IMPLANT
SET CYSTO W/LG BORE CLAMP LF (SET/KITS/TRAYS/PACK) ×2 IMPLANT
SHEATH URETERAL 12FRX35CM (MISCELLANEOUS) IMPLANT
SOL .9 NS 3000ML IRR  AL (IV SOLUTION) ×1
SOL .9 NS 3000ML IRR AL (IV SOLUTION) ×1
SOL .9 NS 3000ML IRR UROMATIC (IV SOLUTION) ×1 IMPLANT
STENT URET 6FRX24 CONTOUR (STENTS) IMPLANT
STENT URET 6FRX26 CONTOUR (STENTS) ×2 IMPLANT
SURGILUBE 2OZ TUBE FLIPTOP (MISCELLANEOUS) ×2 IMPLANT
SYR 10ML LL (SYRINGE) ×2 IMPLANT
TRACTIP FLEXIVA PULSE ID 200 (Laser) IMPLANT
VALVE UROSEAL ADJ ENDO (VALVE) IMPLANT
WATER STERILE IRR 1000ML POUR (IV SOLUTION) ×2 IMPLANT

## 2020-06-16 NOTE — Discharge Instructions (Signed)

## 2020-06-16 NOTE — Op Note (Signed)
Date of procedure: 06/16/20  Preoperative diagnosis:  1. Right distal ureteral stone 2. Right renal stone  Postoperative diagnosis:  1. Same  Procedure: 1. Cystoscopy, right retrograde pyelogram with intraoperative interpretation, right ureteral stent placement 2. Right ureteroscopy, laser lithotripsy and basket extraction of right distal ureteral stone 3. Right ureteroscopy, laser lithotripsy of right renal lower pole stone  Surgeon: Nickolas Madrid, MD  Anesthesia: General  Complications: None  Intraoperative findings:  1.  Right distal ureteral stone fragmented and basket extracted 2.  Right lower pole stone dusted 3.  Large prostate with median lobe  EBL: Minimal  Specimens: Stone for analysis  Drains: Right 6 French by 26 cm ureteral stent with Dangler  Indication: Todd Ortega is a 86 y.o. patient with 1 cm right distal ureteral stone and a right lower pole stone who previously presented with sepsis and underwent urgent stent placement.  After reviewing the management options for treatment, they elected to proceed with the above surgical procedure(s). We have discussed the potential benefits and risks of the procedure, side effects of the proposed treatment, the likelihood of the patient achieving the goals of the procedure, and any potential problems that might occur during the procedure or recuperation. Informed consent has been obtained.  Description of procedure:  The patient was taken to the operating room and general anesthesia was induced. SCDs were placed for DVT prophylaxis. The patient was placed in the dorsal lithotomy position, prepped and draped in the usual sterile fashion, and preoperative antibiotics(cefazolin and vancomycin) were administered. A preoperative time-out was performed.   A 21 French rigid cystoscope was used to intubate the urethra and a normal-appearing urethra was followed proximally into the bladder.  The prostate was large with a significant  median lobe.  The bladder showed mild trabeculations but was otherwise normal.  To see the right ureteral stent in the right ureter, I actually had to start at the left side and move the median lobe out of the way with the scope to access the collecting system.  The ureteral stent was grasped and pulled to the meatus and a wire was advanced up into the kidney under fluoroscopic vision.  A semirigid ureteroscope was advanced over the wire and a 1 cm stone identified in the distal ureter.  This was fragmented into 4 to 5 pieces using a 242 m laser fiber on settings of 1.0 J and 10 Hz.  All pieces were basket extracted and removed.  This was sent for analysis.  The semirigid ureteroscope was then used to advance a second safety wire up into the kidney.  The single channel digital flexible ureteroscope advanced easily over the wire up into the kidney.  Thorough inspection of the kidney revealed a 6 mm stone in the lower pole.  This was fragmented to dust.  Thorough inspection revealed no other fragments.  A retrograde pyelogram was performed from the proximal ureter that showed no filling defects or extravasation.  The rigid cystoscope was backloaded over the wire and a 6 [place] by 26 cm ureteral stent was uneventfully placed with a curl in the upper pole, as well as in the bladder.  The bladder was drained, and the Dangler was secured to the penis using Mastisol and Tegaderm.  Disposition: Stable to PACU  Plan: Remove stent at home on Tuesday morning Nitrofurantoin prophylaxis with stent in place Follow-up in clinic in 1 month with renal ultrasound, IPSS, PVR  Nickolas Madrid, MD

## 2020-06-16 NOTE — H&P (Signed)
   06/16/20 8:41 AM   Todd Ortega [birth year] [id number]  CC: Right ureteral stone  HPI: 86 year old male who previously presented with right distal ureteral stone and sepsis from urinary source and underwent emergent stent placement.  Presents today for definitive management.  Denies any chest pain or shortness of breath.   PMH: Past Medical History:  Diagnosis Date  . Arthritis   . BPH (benign prostatic hypertrophy)   . Chronic kidney disease   . Complete heart block ([place])   . Complication of anesthesia    Hard to wake.  . Coronary artery disease   . Diabetes mellitus without complication ([place])   . Foot drop, right foot   . Hyperlipidemia   . Hypertension   . Hyperthyroidism   . Nephrolithiasis   . Neuropathy   . Shortness of breath dyspnea   . Sleep apnea    Does not use CPAP    Surgical History: Past Surgical History:  Procedure Laterality Date  . CARDIAC CATHETERIZATION    . CYSTOSCOPY WITH STENT PLACEMENT Right 05/13/2020   Procedure: CYSTOSCOPY RETROGRADE LASER  WITH STENT PLACEMENT RIGHT;  Surgeon: Billey Co, MD;  Location: [facility] ORS;  Service: Urology;  Laterality: Right;  . EYE SURGERY    . HERNIA REPAIR Bilateral    Inguinal Hernia Repair  . MASS EXCISION     removed from left hand  . PACEMAKER INSERTION N/A 04/20/2015   Procedure: INSERTION PACEMAKER;  Surgeon: Isaias Cowman, MD;  Location: [facility] ORS;  Service: Cardiovascular;  Laterality: N/A;    Family History: Family History  Problem Relation Age of Onset  . Heart attack Mother   . Gout Mother   . Diabetes Mother   . Hypertension Mother   . Hyperlipidemia Mother   . Lung cancer Father   . Drug abuse Brother     Social History:  reports that he has never smoked. He has never used smokeless tobacco. He reports that he does not drink alcohol and does not use drugs.  Physical Exam: BP (!) 150/83   Pulse 70   Temp 98.6 F (37 C) (Oral)   Resp 16   Ht 5\' 6"  (1.676 m)   Wt 74 kg    SpO2 99%   BMI 26.33 kg/m    Constitutional:  Alert and oriented, No acute distress. Cardiovascular: Regular rate and rhythm Respiratory: Clear to auscultation bilaterally GI: Abdomen is soft, nontender, nondistended, no abdominal masses  Laboratory Data: Urine culture 1/3 with low growth of staph epidermidis and Enterococcus, treated with culture appropriate antibiotics.  Original urine culture 12/11 with Klebsiella.   Assessment & Plan:   86 year old male with right distal ureteral stone and right lower pole stone previously stented for sepsis from urinary source, here today for definitive ureteroscopy.  We specifically discussed the risks ureteroscopy including bleeding, infection/sepsis, stent related symptoms including flank pain/urgency/frequency/incontinence/dysuria, ureteral injury, inability to access stone, or need for staged or additional procedures.  Right ureteroscopy, laser lithotripsy, stent placement  Nickolas Madrid, MD 06/16/2020  [facility] [address], [place], [place] [address] [phone]

## 2020-06-16 NOTE — Anesthesia Procedure Notes (Signed)
Procedure Name: Intubation Date/Time: 06/16/2020 9:22 AM Performed by: Nelda Marseille, CRNA Pre-anesthesia Checklist: Patient identified, Patient being monitored, Timeout performed, Emergency Drugs available and Suction available Patient Re-evaluated:Patient Re-evaluated prior to induction Oxygen Delivery Method: Circle system utilized Preoxygenation: Pre-oxygenation with 100% oxygen Induction Type: IV induction Ventilation: Mask ventilation without difficulty Laryngoscope Size: Mac, 3 and McGraph Grade View: Grade I Tube type: Oral Tube size: 7.5 mm Number of attempts: 1 Airway Equipment and Method: Stylet Placement Confirmation: ETT inserted through vocal cords under direct vision,  positive ETCO2 and breath sounds checked- equal and bilateral Secured at: 21 cm Tube secured with: Tape Dental Injury: Teeth and Oropharynx as per pre-operative assessment

## 2020-06-16 NOTE — Transfer of Care (Signed)
Immediate Anesthesia Transfer of Care Note  Patient: Todd Ortega  Procedure(s) Performed: CYSTOSCOPY/URETEROSCOPY/HOLMIUM LASER/STENT EXCHANGE (Right Ureter) CYSTOSCOPY WITH RETROGRADE PYELOGRAM (Right Ureter)  Patient Location: PACU  Anesthesia Type:General  Level of Consciousness: sedated  Airway & Oxygen Therapy: Patient Spontanous Breathing and Patient connected to face mask oxygen  Post-op Assessment: Report given to RN and Post -op Vital signs reviewed and stable  Post vital signs: Reviewed and stable  Last Vitals:  Vitals Value Taken Time  BP 157/82 06/16/20 1004  Temp 36.4 C 06/16/20 1004  Pulse 59 06/16/20 1007  Resp 20 06/16/20 1007  SpO2 99 % 06/16/20 1007  Vitals shown include unvalidated device data.  Last Pain:  Vitals:   06/16/20 0819  TempSrc: Oral  PainSc: 0-No pain         Complications: No complications documented.

## 2020-06-16 NOTE — Anesthesia Preprocedure Evaluation (Addendum)
Anesthesia Evaluation  Patient identified by MRN, date of birth, ID band Patient awake    Reviewed: Allergy & Precautions, H&P , NPO status , Patient's Chart, lab work & pertinent test results  Airway Mallampati: III       Dental no notable dental hx.    Pulmonary shortness of breath, sleep apnea ,    Pulmonary exam normal breath sounds clear to auscultation       Cardiovascular hypertension, + CAD  Normal cardiovascular exam+ dysrhythmias      Neuro/Psych negative neurological ROS  negative psych ROS   GI/Hepatic negative GI ROS, Neg liver ROS,   Endo/Other  diabetesHyperthyroidism   Renal/GU CRFRenal disease  negative genitourinary   Musculoskeletal  (+) Arthritis ,   Abdominal   Peds negative pediatric ROS (+)  Hematology negative hematology ROS (+)   Anesthesia Other Findings Past Medical History: No date: Arthritis No date: BPH (benign prostatic hypertrophy) No date: Chronic kidney disease No date: Complete heart block (HCC) No date: Complication of anesthesia     Comment:  Hard to wake. No date: Coronary artery disease No date: Diabetes mellitus without complication ([facility]) No date: Foot drop, right foot No date: Hyperlipidemia No date: Hypertension No date: Hyperthyroidism No date: Nephrolithiasis No date: Neuropathy No date: Shortness of breath dyspnea No date: Sleep apnea     Comment:  Does not use CPAP   Reproductive/Obstetrics negative OB ROS                             Anesthesia Physical Anesthesia Plan  ASA: III  Anesthesia Plan: General   Post-op Pain Management:    Induction: Intravenous  PONV Risk Score and Plan: 2 and Ondansetron and Dexamethasone  Airway Management Planned: Oral ETT  Additional Equipment:   Intra-op Plan:   Post-operative Plan: Extubation in OR  Informed Consent: I have reviewed the patients History and Physical, chart, labs  and discussed the procedure including the risks, benefits and alternatives for the proposed anesthesia with the patient or authorized representative who has indicated his/her understanding and acceptance.       Plan Discussed with: CRNA, Anesthesiologist and Surgeon  Anesthesia Plan Comments:        Anesthesia Quick Evaluation

## 2020-06-16 NOTE — Anesthesia Postprocedure Evaluation (Signed)
Anesthesia Post Note  Patient: Todd Ortega  Procedure(s) Performed: CYSTOSCOPY/URETEROSCOPY/HOLMIUM LASER/STENT EXCHANGE (Right Ureter) CYSTOSCOPY WITH RETROGRADE PYELOGRAM (Right Ureter)  Patient location during evaluation: PACU Anesthesia Type: General Level of consciousness: awake Pain management: pain level controlled Vital Signs Assessment: post-procedure vital signs reviewed and stable Respiratory status: spontaneous breathing Cardiovascular status: stable Postop Assessment: no apparent nausea or vomiting Anesthetic complications: no   No complications documented.   Last Vitals:  Vitals:   06/16/20 1018 06/16/20 1033  BP: 133/76 136/77  Pulse: 60 (!) 59  Resp: 20 14  Temp:  36.4 C  SpO2: 99% 95%    Last Pain:  Vitals:   06/16/20 1033  TempSrc:   PainSc: 0-No pain                 Neva Seat

## 2020-06-20 ENCOUNTER — Other Ambulatory Visit: Payer: Self-pay | Admitting: Urology

## 2020-06-21 LAB — CALCULI, WITH PHOTOGRAPH (CLINICAL LAB)
Calcium Oxalate Dihydrate: 30 %
Calcium Oxalate Monohydrate: 65 %
Hydroxyapatite: 5 %
Weight Calculi: 32 mg

## 2020-06-28 ENCOUNTER — Encounter: Payer: Medicare HMO | Admitting: Urology

## 2020-06-29 DIAGNOSIS — I10 Essential (primary) hypertension: Secondary | ICD-10-CM | POA: Diagnosis not present

## 2020-06-29 DIAGNOSIS — Z87442 Personal history of urinary calculi: Secondary | ICD-10-CM | POA: Diagnosis not present

## 2020-06-29 DIAGNOSIS — Z950 Presence of cardiac pacemaker: Secondary | ICD-10-CM | POA: Diagnosis not present

## 2020-06-29 DIAGNOSIS — E782 Mixed hyperlipidemia: Secondary | ICD-10-CM | POA: Diagnosis not present

## 2020-06-29 DIAGNOSIS — E119 Type 2 diabetes mellitus without complications: Secondary | ICD-10-CM | POA: Diagnosis not present

## 2020-07-17 ENCOUNTER — Other Ambulatory Visit: Payer: Self-pay

## 2020-07-17 ENCOUNTER — Ambulatory Visit
Admission: RE | Admit: 2020-07-17 | Discharge: 2020-07-17 | Disposition: A | Discharge status: Home / Self Care | Payer: Medicare HMO | Source: Ambulatory Visit | Attending: Urology | Admitting: Urology

## 2020-07-17 DIAGNOSIS — N1330 Unspecified hydronephrosis: Secondary | ICD-10-CM | POA: Diagnosis not present

## 2020-07-17 DIAGNOSIS — N132 Hydronephrosis with renal and ureteral calculous obstruction: Secondary | ICD-10-CM | POA: Diagnosis not present

## 2020-07-19 ENCOUNTER — Encounter: Payer: Self-pay | Admitting: Urology

## 2020-07-19 ENCOUNTER — Other Ambulatory Visit: Payer: Self-pay

## 2020-07-19 ENCOUNTER — Ambulatory Visit: Payer: Medicare HMO | Admitting: Urology

## 2020-07-19 VITALS — BP 152/71 | HR 75 | Ht 66.0 in | Wt 178.0 lb

## 2020-07-19 DIAGNOSIS — N138 Other obstructive and reflux uropathy: Secondary | ICD-10-CM | POA: Diagnosis not present

## 2020-07-19 DIAGNOSIS — N401 Enlarged prostate with lower urinary tract symptoms: Secondary | ICD-10-CM

## 2020-07-19 DIAGNOSIS — N200 Calculus of kidney: Secondary | ICD-10-CM | POA: Diagnosis not present

## 2020-07-19 DIAGNOSIS — N1330 Unspecified hydronephrosis: Secondary | ICD-10-CM

## 2020-07-19 DIAGNOSIS — Z87440 Personal history of urinary (tract) infections: Secondary | ICD-10-CM | POA: Diagnosis not present

## 2020-07-19 LAB — BLADDER SCAN AMB NON-IMAGING

## 2020-07-19 NOTE — Patient Instructions (Signed)
You are not emptying your bladder all the way.  Please try to urinate every 2 hours during the day to help improve emptying.  Call us if you have any urinary infections or are unable to urinate  Holmium Laser Enucleation of the Prostate (HoLEP)  HoLEP is a treatment for men with benign prostatic hyperplasia (BPH). The laser surgery removed blockages of urine flow, and is done without any incisions on the body.     What is HoLEP?  HoLEP is a type of laser surgery used to treat obstruction (blockage) of urine flow as a result of benign prostatic hyperplasia (BPH). In men with BPH, the prostate gland is not cancerous, but has become enlarged. An enlarged prostate can result in a number of urinary tract symptoms such as weak urinary stream, difficulty in starting urination, inability to urinate, frequent urination, or getting up at night to urinate.  HoLEP was developed in the 1990's as a more effective and less expensive surgical option for BPH, compared to other surgical options such as laser vaporization(PVP/greenlight laser), transurethral resection of the prostate(TURP), and open simple prostatectomy.   What happens during a HoLEP?  HoLEP requires general anesthesia ("asleep" throughout the procedure).   An antibiotic is given to reduce the risk of infection  A surgical instrument called a resectoscope is inserted through the urethra (the tube that carries urine from the bladder). The resectoscope has a camera that allows the surgeon to view the internal structure of the prostate gland, and to see where the incisions are being made during surgery.  The laser is inserted into the resectoscope and is used to enucleate (free up) the enlarged prostate tissue from the capsule (outer shell) and then to seal up any blood vessels. The tissue that has been removed is pushed back into the bladder.  A morcellator is placed through the resectoscope, and is used to suction out the prostate tissue that has  been pushed into the bladder.  When the prostate tissue has been removed, the resectoscope is removed, and a foley catheter is placed to allow healing and drain the urine from the bladder.     What happens after a HoLEP?  More than 90% of patients go home the same day a few hours after surgery. Less than 10% will be admitted to the hospital overnight for observation to monitor the urine, or if they have other medical problems.  Fluid is flushed through the catheter for about 1 hour after surgery to clear any blood from the urine. It is normal to have some blood in the urine after surgery. The need for blood transfusion is extremely rare.  Eating and drinking are permitted after the procedure once the patient has fully awakened from anesthesia.  The catheter is usually removed 2-3 days after surgery- the patient will come to clinic to have the catheter removed and make sure they can urinate on their own.  It is very important to drink lots of fluids after surgery for one week to keep the bladder flushed.  At first, there may be some burning with urination, but this typically improved within a few hours to days. Most patients do not have a significant amount of pain, and narcotic pain medications are rarely needed.  Symptoms of urinary frequency, urgency, and even leakage are NORMAL for the first few weeks after surgery as the bladder adjusts after having to work hard against blockage from the prostate for many years. This will improve, but can sometimes take several months.  The use of pelvic floor exercises (Kegel exercises) can help improve problems with urinary incontinence.   After catheter removal, patients will be seen at 6 weeks and 6 months for symptom check  No heavy lifting for at least 2-3 weeks after surgery, however patients can walk and do light activities the first day after surgery. Return to work time depends on occupation.    What are the advantages of HoLEP?  HoLEP  has been studied in many different parts of the world and has been shown to be a safe and effective procedure. Although there are many types of BPH surgeries available, HoLEP offers a unique advantage in being able to remove a large amount of tissue without any incisions on the body, even in very large prostates, while decreasing the risk of bleeding and providing tissue for pathology (to look for cancer). This decreases the need for blood transfusions during surgery, minimizes hospital stay, and reduces the risk of needing repeat treatment.  What are the side effects of HoLEP?  Temporary burning and bleeding during urination. Some blood may be seen in the urine for weeks after surgery and is part of the healing process.  Urinary incontinence (inability to control urine flow) is expected in all patients immediately after surgery and they should wear pads for the first few days/weeks. This typically improves over the course of several weeks. Performing Kegel exercises can help decrease leakage from stress maneuvers such as coughing, sneezing, or lifting. The rate of long term leakage is very low. Patients may also have leakage with urgency and this may be treated with medication. The risk of urge incontinence can be dependent on several factors including age, prostate size, symptoms, and other medical problems.  Retrograde ejaculation or "backwards ejaculation." In 75% of cases, the patient will not see any fluid during ejaculation after surgery.  Erectile function is generally not significantly affected.   What are the risks of HoLEP?  Injury to the urethra or development of scar tissue at a later date  Injury to the capsule of the prostate (typically treated with longer catheterization).  Injury to the bladder or ureteral orifices (where the urine from the kidney drains out)  Infection of the bladder, testes, or kidneys  Return of urinary obstruction at a later date requiring another operation  (<2%)  Need for blood transfusion or re-operation due to bleeding  Failure to relieve all symptoms and/or need for prolonged catheterization after surgery  5-15% of patients are found to have previously undiagnosed prostate cancer in their specimen. Prostate cancer can be treated after HoLEP.  Standard risks of anesthesia including blood clots, heart attacks, etc  When should I call my doctor?  Fever over 101.3 degrees  Inability to urinate, or large blood clots in the urine

## 2020-07-19 NOTE — Progress Notes (Signed)
   07/19/2020 3:48 PM   Lang Snow 08-04-1934 [id number]  Reason for visit: Follow up nephrolithiasis, BPH  HPI: I saw Todd Ortega and his wife back in urology clinic for the above issues.  Briefly, he is an 86 year old male with a long history of BPH on maximal medical therapy with Flomax and finasteride who presented with a right distal ureteral stone and sepsis from urinary source in December 2021 and went emergent stent placement.  He underwent follow-up definitive ureteroscopy on 06/16/2020.  He has a very large prostate measuring 80 g on recent CT with a very large median lobe.  He denies any complaints today and has been doing well since his most recent ureteroscopy.  He had a renal ultrasound performed on 07/17/2020 for standard follow-up after ureteroscopy and this showed bilateral mild hydronephrosis all the way down to the distal ureter with large prostate.  Suspect this is related to incomplete bladder emptying with reflux.  I personally viewed and interpreted the ultrasound and the CT, and prostate measures 80 g with very large median lobe.  Most recent renal function was normal at 0.79 on 06/14/2020.  We had a long conversation about his incomplete bladder emptying with bilateral mild hydronephrosis.  PVR is also elevated at 240 mL in clinic today.  He really denies any significant urinary symptoms and voids 2-3 times during the day, and 0-1 time overnight.  He denies any significant weak stream or other complaints.  He does not have any leakage.  IPSS score today is 6, with quality of life mostly satisfied.  We discussed options including timed voiding with close follow-up and repeat renal ultrasound in 2 to 3 months as well as BMP and PVR, or considering an outlet procedure like HOLEP at least for his large median lobe which I think will improve his emptying, decrease his risk of UTIs and renal impairment, and allow him to discontinue his Flomax and finasteride.  As he is minimally  symptomatic, he would like to hold off at this time and start with timed voiding.  Return precautions were discussed extensively.  Continue Flomax and finasteride Close follow-up in 2 to 3 months with repeat renal ultrasound and BMP, IPSS, PVR   Billey Co, MD  [facility] [address], [place], [place] [address] [phone]

## 2020-07-19 NOTE — Addendum Note (Signed)
Addended by: Despina Hidden on: 07/19/2020 04:06 PM   Modules accepted: Orders

## 2020-08-03 ENCOUNTER — Other Ambulatory Visit: Payer: Self-pay | Admitting: Family Medicine

## 2020-08-03 DIAGNOSIS — N401 Enlarged prostate with lower urinary tract symptoms: Secondary | ICD-10-CM

## 2020-08-03 DIAGNOSIS — N138 Other obstructive and reflux uropathy: Secondary | ICD-10-CM

## 2020-08-23 ENCOUNTER — Ambulatory Visit (INDEPENDENT_AMBULATORY_CARE_PROVIDER_SITE_OTHER): Payer: Medicare HMO | Admitting: Family Medicine

## 2020-08-23 ENCOUNTER — Encounter: Payer: Self-pay | Admitting: Family Medicine

## 2020-08-23 ENCOUNTER — Other Ambulatory Visit: Payer: Self-pay

## 2020-08-23 VITALS — BP 146/75 | HR 66 | Temp 98.3°F | Resp 18 | Ht 66.0 in | Wt 180.0 lb

## 2020-08-23 DIAGNOSIS — I10 Essential (primary) hypertension: Secondary | ICD-10-CM | POA: Diagnosis not present

## 2020-08-23 DIAGNOSIS — G629 Polyneuropathy, unspecified: Secondary | ICD-10-CM | POA: Diagnosis not present

## 2020-08-23 DIAGNOSIS — E1140 Type 2 diabetes mellitus with diabetic neuropathy, unspecified: Secondary | ICD-10-CM

## 2020-08-23 DIAGNOSIS — N200 Calculus of kidney: Secondary | ICD-10-CM | POA: Diagnosis not present

## 2020-08-23 DIAGNOSIS — R61 Generalized hyperhidrosis: Secondary | ICD-10-CM

## 2020-08-23 DIAGNOSIS — E7849 Other hyperlipidemia: Secondary | ICD-10-CM

## 2020-08-23 LAB — POCT GLYCOSYLATED HEMOGLOBIN (HGB A1C)
Est. average glucose Bld gHb Est-mCnc: 134
Hemoglobin A1C: 6.3 % — AB (ref 4.0–5.6)

## 2020-08-23 MED ORDER — ROSUVASTATIN CALCIUM 40 MG PO TABS: 40.0000 mg | ORAL_TABLET | Freq: Every day | ORAL | 3 refills | Status: DC

## 2020-08-23 NOTE — Progress Notes (Signed)
I,Todd Ortega,acting as a scribe for Todd Durie, MD.,have documented all relevant documentation on the behalf of Todd Durie, MD,as directed by  Todd Durie, MD while in the presence of Todd Durie, MD.   Established patient visit   Patient: Todd Ortega   DOB: 11/24/1934   86 y.o. Male  MRN: [id number] Visit Date: 08/23/2020  Today's healthcare provider: [name], MD   Chief Complaint  Patient presents with  . Follow-up  . Diabetes  . Hypertension   Subjective    HPI  Patient comes in today and overall feels fairly well for follow-up. He still does some evening janitor work and he states he always has sweating a lot when he works but his wife is wants him to mention that he sweats when he vacuums the floor at work.  He has no chest pain dyspnea on exertion or syncope or presyncope.  He is actively followed by Dr. Clayborn Bigness. It is time for an A1c and patient's last LDL cholesterol was 108. Diabetes Mellitus Type II, follow-up  Lab Results  Component Value Date   HGBA1C 6.8 (H) 05/25/2020   HGBA1C 6.4 (H) 02/24/2020   HGBA1C 6.3 (A) 08/16/2019   Last seen for diabetes 3 months ago.  Management since then includes continuing the same treatment. He reports good compliance with treatment. He is not having side effects. none  Home blood sugar records: fasting range: not checking  Episodes of hypoglycemia? No none   Current insulin regiment: n/a Most Recent Eye Exam: 06/2020  --------------------------------------------------------------------  Hypertension, follow-up  BP Readings from Last 3 Encounters:  08/23/20 (!) 146/75  07/19/20 (!) 152/71  06/16/20 136/77   Wt Readings from Last 3 Encounters:  08/23/20 180 lb (81.6 kg)  07/19/20 178 lb (80.7 kg)  06/16/20 163 lb 2.3 oz (74 kg)     He was last seen for hypertension 3 months ago.  BP at that visit was 117/61. Management since that visit includes; on lisinopril. He  reports good compliance with treatment. He is not having side effects. none He is exercising. He is adherent to low salt diet.   Outside blood pressures are 130/72.  He does not smoke.  Use of agents associated with hypertension: none.   --------------------------------------------------------------------  Lipid/Cholesterol, follow-up  Last Lipid Panel: Lab Results  Component Value Date   CHOL 182 02/24/2020   LDLCALC 108 (H) 02/24/2020   HDL 63 02/24/2020   TRIG 229 (H) 05/14/2020    He was last seen for this 6 months ago.  Management since that visit includes; labs checked showing-stable.  He reports good compliance with treatment. He is not having side effects. none He is following a Regular, Low Sodium diet. Current exercise: walking  Last metabolic panel Lab Results  Component Value Date   GLUCOSE 143 (H) 06/14/2020   NA 140 06/14/2020   K 3.6 06/14/2020   BUN 14 06/14/2020   CREATININE 0.79 06/14/2020   GFRNONAA >60 06/14/2020   GFRAA 93 05/25/2020   CALCIUM 9.0 06/14/2020   AST 29 05/25/2020   ALT 54 (H) 05/25/2020   The ASCVD Risk score Todd Bussing DC Jr., et al., 2013) failed to calculate for the following reasons:   The 2013 ASCVD risk score is only valid for ages 13 to 58  -------------------------------------------------------------------       Medications: Outpatient Medications Prior to Visit  Medication Sig  . acetaminophen (TYLENOL) 650 MG CR tablet Take 650 mg by mouth  every 8 (eight) hours as needed for pain.  Marland Kitchen ascorbic acid (VITAMIN C) 500 MG tablet Take 500 mg by mouth daily.  Marland Kitchen aspirin EC 81 MG tablet Take 81 mg by mouth at bedtime. Swallow whole.  . Calcium Carb-Cholecalciferol (CALCIUM 500+D3 PO) Take 1 tablet by mouth daily.  . finasteride (PROSCAR) 5 MG tablet TAKE 1 TABLET (5 MG TOTAL) BY MOUTH AT BEDTIME.  Marland Kitchen gabapentin (NEURONTIN) 300 MG capsule Take 1 capsule (300 mg total) by mouth daily. (Patient taking differently: Take 300 mg by  mouth in the morning and at bedtime.)  . glucose blood test strip BAYER CONTOUR TEST (In Vitro Strip)  1 (one) Strip Strip check sugars once daily, or as needed for 0 days  Quantity: 300;  Refills: 4   Ordered :31-Mar-2013  [name] ;  Started 31-Mar-2013 Active Comments: Medication taken as needed.  Marland Kitchen lisinopril (PRINIVIL,ZESTRIL) 10 MG tablet Take 10 mg by mouth at bedtime.  Marland Kitchen loratadine (CLARITIN) 10 MG tablet Take 1 tablet (10 mg total) by mouth daily.  . simvastatin (ZOCOR) 40 MG tablet TAKE 1 TABLET EVERY DAY (Patient taking differently: Take 40 mg by mouth at bedtime.)  . tamsulosin (FLOMAX) 0.4 MG CAPS capsule TAKE 1 CAPSULE (0.4 MG TOTAL) BY MOUTH DAILY AFTER BREAKFAST.  Marland Kitchen prednisoLONE acetate (PRED FORTE) 1 % ophthalmic suspension Place 1 drop into the right eye in the morning, at noon, in the evening, and at bedtime. (Patient not taking: Reported on 08/23/2020)   No facility-administered medications prior to visit.    Review of Systems  Constitutional: Negative for appetite change, chills and fever.  Respiratory: Negative for chest tightness, shortness of breath and wheezing.   Cardiovascular: Negative for chest pain and palpitations.  Gastrointestinal: Negative for abdominal pain, nausea and vomiting.        Objective    BP (!) 146/75 (BP Location: Left Arm, Patient Position: Sitting, Cuff Size: Large)   Pulse 66   Temp 98.3 F (36.8 C) (Oral)   Resp 18   Ht 5\' 6"  (1.676 m)   Wt 180 lb (81.6 kg)   SpO2 97%   BMI 29.05 kg/m  BP Readings from Last 3 Encounters:  08/23/20 (!) 146/75  07/19/20 (!) 152/71  06/16/20 136/77   Wt Readings from Last 3 Encounters:  08/23/20 180 lb (81.6 kg)  07/19/20 178 lb (80.7 kg)  06/16/20 163 lb 2.3 oz (74 kg)       Physical Exam Vitals and nursing note reviewed.  Constitutional:      Appearance: He is well-developed.  HENT:     Head: Normocephalic and atraumatic.     Right Ear: External ear normal.     Left Ear: External  ear normal.     Nose: Nose normal.  Eyes:     Conjunctiva/sclera: Conjunctivae normal.     Pupils: Pupils are equal, round, and reactive to light.  Cardiovascular:     Rate and Rhythm: Normal rate and regular rhythm.     Heart sounds: Normal heart sounds.  Pulmonary:     Effort: Pulmonary effort is normal.     Breath sounds: Normal breath sounds.  Abdominal:     General: Bowel sounds are normal.     Palpations: Abdomen is soft.  Genitourinary:    Penis: Normal.      Prostate: Normal.     Rectum: Normal.  Musculoskeletal:        General: Normal range of motion.     Cervical back:  Normal range of motion and neck supple.  Skin:    General: Skin is warm and dry.     Comments: Left upper back with sebaceous cyst.  Neurological:     Mental Status: He is alert and oriented to person, place, and time.     Cranial Nerves: No cranial nerve deficit.     Comments: Pt has difficulty with walking. He has a right foot drop. He has decreased sensation to both feet.  Psychiatric:        Mood and Affect: Mood normal.        Behavior: Behavior normal.        Thought Content: Thought content normal.        Judgment: Judgment normal.     ECG reveals sinus rhythm rate of about 60 with short PR.  No results found for any visits on 08/23/20.  Assessment & Plan     1. Type 2 diabetes mellitus with diabetic neuropathy, without long-term current use of insulin (HCC) Check A1c and follow-up.  Presently diet controlled.  Consider Metformin - POCT glycosylated hemoglobin (Hb A1C)  2. Essential (primary) hypertension Good control on lisinopril  3. Diaphoresis Do not think this is anginal equivalent.  Refer back to Dr. Clayborn Bigness is he does not have follow-up arranged. - EKG 12-Lead - Ambulatory referral to Cardiology  4. Other hyperlipidemia Change simvastatin 40 to rosuvastatin 40 to get LDL goal less than 70 - rosuvastatin (CRESTOR) 40 MG tablet; Take 1 tablet (40 mg total) by mouth daily.   Dispense: 90 tablet; Refill: 3 - Ambulatory referral to Cardiology  5. Kidney stone Tamsulosin  6. Neuropathy Chronic issue.   No follow-ups on file.      I, Todd Durie, MD, have reviewed all documentation for this visit. The documentation on 08/23/20 for the exam, diagnosis, procedures, and orders are all accurate and complete.    Aamira Bischoff Cranford Mon, MD  [facility] [phone] (phone) [phone] (fax)  [place]

## 2020-08-23 NOTE — Patient Instructions (Signed)
Start Crestor 40mg  after finishing simvastatin 40mg  tablets.

## 2020-09-25 DIAGNOSIS — R2681 Unsteadiness on feet: Secondary | ICD-10-CM | POA: Diagnosis not present

## 2020-09-25 DIAGNOSIS — M21371 Foot drop, right foot: Secondary | ICD-10-CM | POA: Diagnosis not present

## 2020-09-25 DIAGNOSIS — E1142 Type 2 diabetes mellitus with diabetic polyneuropathy: Secondary | ICD-10-CM | POA: Diagnosis not present

## 2020-10-05 ENCOUNTER — Other Ambulatory Visit: Payer: Self-pay

## 2020-10-05 ENCOUNTER — Ambulatory Visit
Admission: RE | Admit: 2020-10-05 | Discharge: 2020-10-05 | Disposition: A | Discharge status: Home / Self Care | Payer: Medicare HMO | Source: Ambulatory Visit | Attending: Urology | Admitting: Urology

## 2020-10-05 DIAGNOSIS — N1330 Unspecified hydronephrosis: Secondary | ICD-10-CM | POA: Insufficient documentation

## 2020-10-05 DIAGNOSIS — N132 Hydronephrosis with renal and ureteral calculous obstruction: Secondary | ICD-10-CM | POA: Diagnosis not present

## 2020-10-11 ENCOUNTER — Other Ambulatory Visit: Payer: Self-pay

## 2020-10-11 ENCOUNTER — Other Ambulatory Visit: Payer: Medicare HMO

## 2020-10-11 DIAGNOSIS — N401 Enlarged prostate with lower urinary tract symptoms: Secondary | ICD-10-CM | POA: Diagnosis not present

## 2020-10-11 DIAGNOSIS — N138 Other obstructive and reflux uropathy: Secondary | ICD-10-CM

## 2020-10-12 LAB — BASIC METABOLIC PANEL
BUN/Creatinine Ratio: 18 (ref 10–24)
BUN: 15 mg/dL (ref 8–27)
CO2: 23 mmol/L (ref 20–29)
Calcium: 9.7 mg/dL (ref 8.6–10.2)
Chloride: 104 mmol/L (ref 96–106)
Creatinine, Ser: 0.84 mg/dL (ref 0.76–1.27)
Glucose: 167 mg/dL — ABNORMAL HIGH (ref 65–99)
Potassium: 4.1 mmol/L (ref 3.5–5.2)
Sodium: 142 mmol/L (ref 134–144)
eGFR: 87 mL/min/{1.73_m2} (ref >59)

## 2020-10-18 ENCOUNTER — Encounter: Payer: Self-pay | Admitting: Urology

## 2020-10-18 ENCOUNTER — Other Ambulatory Visit: Payer: Self-pay

## 2020-10-18 ENCOUNTER — Ambulatory Visit: Payer: Medicare HMO | Admitting: Urology

## 2020-10-18 VITALS — BP 144/64 | HR 77 | Ht 65.0 in | Wt 175.0 lb

## 2020-10-18 DIAGNOSIS — N138 Other obstructive and reflux uropathy: Secondary | ICD-10-CM | POA: Diagnosis not present

## 2020-10-18 DIAGNOSIS — N200 Calculus of kidney: Secondary | ICD-10-CM | POA: Diagnosis not present

## 2020-10-18 DIAGNOSIS — N1330 Unspecified hydronephrosis: Secondary | ICD-10-CM

## 2020-10-18 DIAGNOSIS — N401 Enlarged prostate with lower urinary tract symptoms: Secondary | ICD-10-CM

## 2020-10-18 LAB — BLADDER SCAN AMB NON-IMAGING

## 2020-10-18 MED ORDER — FINASTERIDE 5 MG PO TABS: 5.0000 mg | ORAL_TABLET | Freq: Every day | ORAL | 1 refills | Status: DC

## 2020-10-18 MED ORDER — TAMSULOSIN HCL 0.4 MG PO CAPS: 0.4000 mg | ORAL_CAPSULE | Freq: Every day | ORAL | 3 refills | Status: DC

## 2020-10-18 NOTE — Progress Notes (Signed)
   10/18/2020 1:02 PM   Lang Snow 1934/12/07 [id number]  Reason for visit: Follow up nephrolithiasis, BPH  HPI: He is an 86 year old male with a long history of BPH on maximal medical therapy with Flomax and finasteride who presented with a right distal ureteral stone and sepsis from urinary source in December 2021 and went emergent stent placement.  He underwent follow-up definitive ureteroscopy on 06/16/2020.  He has a very large prostate measuring 80 g on recent CT with a very large median lobe.   He had a renal ultrasound performed on 07/17/2020 for standard follow-up after ureteroscopy and this showed bilateral mild hydronephrosis all the way down to the distal ureter with large prostate.  Suspect this is related to incomplete bladder emptying with reflux.  I personally viewed and interpreted the ultrasound and the CT, and prostate measures 80 g with very large median lobe.  Most recent renal function is stable and normal at creatinine 0.84, EGFR greater than 60 on most recent BMP May 2022.  I personally reviewed his most recent renal ultrasound dated 10/08/2020 that shows a distended bladder and bilateral mild hydronephrosis.  For the first time, PVR is normal today at 37 mL.  IPSS score today is 6, with quality of life delighted.  We previously have had multiple conversations about his incomplete bladder emptying with elevated PVRs and bilateral mild hydronephrosis secondary to incomplete emptying.  He has minimal to no urinary symptoms and really denies any urinary complaints.  We again discussed options including outlet procedure with HOLEP, versus close observation and timed voiding.  We discussed the risks including recurrent UTIs, sepsis, retention, and CKD.  I think he has a good understanding of the risks of ongoing observation moving forward, and he would like to continue with close observation.  Return precautions discussed extensively.  Flomax and finasteride refilled RTC 1 year with  BMP and renal ultrasound, PVR   Billey Co, MD  [facility] [address], [place], [place] [address] [phone]

## 2020-12-12 DIAGNOSIS — I442 Atrioventricular block, complete: Secondary | ICD-10-CM | POA: Diagnosis not present

## 2020-12-20 DIAGNOSIS — R0600 Dyspnea, unspecified: Secondary | ICD-10-CM | POA: Diagnosis not present

## 2020-12-20 DIAGNOSIS — I10 Essential (primary) hypertension: Secondary | ICD-10-CM | POA: Diagnosis not present

## 2020-12-20 DIAGNOSIS — Z136 Encounter for screening for cardiovascular disorders: Secondary | ICD-10-CM | POA: Diagnosis not present

## 2020-12-20 DIAGNOSIS — E782 Mixed hyperlipidemia: Secondary | ICD-10-CM | POA: Diagnosis not present

## 2020-12-20 DIAGNOSIS — N200 Calculus of kidney: Secondary | ICD-10-CM | POA: Diagnosis not present

## 2020-12-20 DIAGNOSIS — Z950 Presence of cardiac pacemaker: Secondary | ICD-10-CM | POA: Diagnosis not present

## 2020-12-20 DIAGNOSIS — E119 Type 2 diabetes mellitus without complications: Secondary | ICD-10-CM | POA: Diagnosis not present

## 2021-01-18 DIAGNOSIS — Z136 Encounter for screening for cardiovascular disorders: Secondary | ICD-10-CM | POA: Diagnosis not present

## 2021-01-18 DIAGNOSIS — I6523 Occlusion and stenosis of bilateral carotid arteries: Secondary | ICD-10-CM | POA: Diagnosis not present

## 2021-02-14 ENCOUNTER — Other Ambulatory Visit: Payer: Self-pay | Admitting: Urology

## 2021-02-14 DIAGNOSIS — N138 Other obstructive and reflux uropathy: Secondary | ICD-10-CM

## 2021-02-15 ENCOUNTER — Other Ambulatory Visit: Payer: Self-pay

## 2021-02-15 DIAGNOSIS — N138 Other obstructive and reflux uropathy: Secondary | ICD-10-CM

## 2021-02-15 MED ORDER — FINASTERIDE 5 MG PO TABS: 5.0000 mg | ORAL_TABLET | Freq: Every day | ORAL | 2 refills | Status: DC

## 2021-02-15 NOTE — Telephone Encounter (Signed)
[facility] pharmacy has now changed to [place] [facility] requests new RX for Finasteride. RX sent.

## 2021-02-27 NOTE — Progress Notes (Signed)
I,Todd Ortega,acting as a scribe for Todd Durie, MD.,have documented all relevant documentation on the behalf of Todd Durie, MD,as directed by  Todd Durie, MD while in the presence of Todd Durie, MD.   Annual Wellness Visit     Patient: Todd Ortega, Male    DOB: 05-05-1935, 86 y.o.   MRN: [id number] Visit Date: 02/28/2021  Today's Provider: [name], MD   Chief Complaint  Patient presents with   Medicare Wellness   Subjective    Todd Ortega is a 86 y.o. male who presents today for his Annual Wellness Visit.Annual Physical. He reports consuming a general diet. The patient does not participate in regular exercise at present. He generally feels well. He reports sleeping fairly well. He does not have additional problems to discuss today.   HPI    Medications: Outpatient Medications Prior to Visit  Medication Sig   acetaminophen (TYLENOL) 650 MG CR tablet Take 650 mg by mouth every 8 (eight) hours as needed for pain.   ascorbic acid (VITAMIN C) 500 MG tablet Take 500 mg by mouth daily.   aspirin EC 81 MG tablet Take 81 mg by mouth at bedtime. Swallow whole.   Calcium Carb-Cholecalciferol (CALCIUM 500+D3 PO) Take 1 tablet by mouth daily.   finasteride (PROSCAR) 5 MG tablet Take 1 tablet (5 mg total) by mouth at bedtime.   gabapentin (NEURONTIN) 300 MG capsule Take 1 capsule (300 mg total) by mouth daily. (Patient taking differently: Take 300 mg by mouth in the morning and at bedtime.)   glucose blood test strip BAYER CONTOUR TEST (In Vitro Strip)  1 (one) Strip Strip check sugars once daily, or as needed for 0 days  Quantity: 300;  Refills: 4   Ordered :31-Mar-2013  [name] ;  Started 31-Mar-2013 Active Comments: Medication taken as needed.   lisinopril (PRINIVIL,ZESTRIL) 10 MG tablet Take 10 mg by mouth at bedtime.   loratadine (CLARITIN) 10 MG tablet Take 1 tablet (10 mg total) by mouth daily.   rosuvastatin (CRESTOR) 40 MG tablet  Take 1 tablet (40 mg total) by mouth daily.   tamsulosin (FLOMAX) 0.4 MG CAPS capsule Take 1 capsule (0.4 mg total) by mouth daily after breakfast.   No facility-administered medications prior to visit.    Allergies  Allergen Reactions   Venlafaxine Other (See Comments)    Sweating, stomach upset, constipation    Patient Care Team: [name]., MD as PCP - General (Family Medicine) [name], MD as Consulting Physician (Cardiology) [name], MD as Consulting Physician (Neurology) [name] [name] (Neurology)  Review of Systems  Constitutional:  Positive for diaphoresis.  All other systems reviewed and are negative.       Objective    Vitals: BP 106/65 (BP Location: Left Arm, Patient Position: Sitting, Cuff Size: Large)   Pulse 64   Temp 98.7 F (37.1 C) (Oral)   Resp 16   Ht 5\' 6"  (1.676 m)   Wt 174 lb (78.9 kg)   SpO2 97%   BMI 28.08 kg/m  BP Readings from Last 3 Encounters:  02/28/21 106/65  10/18/20 (!) 144/64  08/23/20 (!) 146/75   Wt Readings from Last 3 Encounters:  02/28/21 174 lb (78.9 kg)  10/18/20 175 lb (79.4 kg)  08/23/20 180 lb (81.6 kg)      Physical Exam Vitals and nursing note reviewed.  Constitutional:      Appearance: Normal appearance. He is well-developed.  HENT:     Head: Normocephalic and atraumatic.     Right Ear: External ear normal.     Left Ear: External ear normal.     Nose: Nose normal.  Eyes:     Conjunctiva/sclera: Conjunctivae normal.     Pupils: Pupils are equal, round, and reactive to light.  Cardiovascular:     Rate and Rhythm: Normal rate and regular rhythm.     Heart sounds: Normal heart sounds.  Pulmonary:     Effort: Pulmonary effort is normal.     Breath sounds: Normal breath sounds.  Abdominal:     General: Bowel sounds are normal.     Palpations: Abdomen is soft.  Musculoskeletal:        General: Normal range of motion.     Cervical back: Normal range of motion and neck  supple.  Skin:    General: Skin is warm and dry.  Neurological:     Mental Status: He is alert and oriented to person, place, and time.     Cranial Nerves: No cranial nerve deficit.     Coordination: Coordination abnormal.     Comments: Pt has difficulty with walking. He has a right foot drop. He has decreased sensation to both feet.  Psychiatric:        Mood and Affect: Mood normal.        Behavior: Behavior normal.        Thought Content: Thought content normal.        Judgment: Judgment normal.     Most recent functional status assessment: In your present state of health, do you have any difficulty performing the following activities: 02/28/2021  Hearing? N  Vision? N  Difficulty concentrating or making decisions? N  Walking or climbing stairs? Y  Dressing or bathing? N  Doing errands, shopping? N  Some recent data might be hidden   Most recent fall risk assessment: Fall Risk  02/28/2021  Falls in the past year? 0  Number falls in past yr: 0  Injury with Fall? 0  Risk Factor Category  -  Comment -  Risk for fall due to : Impaired balance/gait;Impaired mobility  Risk for fall due to: Comment -  Follow up Falls evaluation completed    Most recent depression screenings: PHQ 2/9 Scores 02/28/2021 08/23/2020  PHQ - 2 Score 0 0  PHQ- 9 Score 0 1   Most recent cognitive screening: 6CIT Screen 04/12/2016  What Year? 0 points  What month? 0 points  What time? 0 points  Count back from 20 0 points  Months in reverse 0 points  Repeat phrase 2 points  Total Score 2   Most recent Audit-C alcohol use screening Alcohol Use Disorder Test (AUDIT) 02/28/2021  1. How often do you have a drink containing alcohol? 0  2. How many drinks containing alcohol do you have on a typical day when you are drinking? 0  3. How often do you have six or more drinks on one occasion? 0  AUDIT-C Score 0  Alcohol Brief Interventions/Follow-up -   A score of 3 or more in women, and 4 or more in  men indicates increased risk for alcohol abuse, EXCEPT if all of the points are from question 1   No results found for any visits on 02/28/21.  Assessment & Plan     Annual wellness visit done today including the all of the following: Reviewed patient's Family Medical History Reviewed and updated list of patient's medical providers  Assessment of cognitive impairment was done Assessed patient's functional ability Established a written schedule for health screening services Health Risk Assessent Completed and Reviewed  Exercise Activities and Dietary recommendations  Goals      Increase water intake     Recommend increasing water intake to 4-6 glasses a day.         Immunization History  Administered Date(s) Administered   Fluad Quad(high Dose 65+) 02/16/2019, 02/24/2020, 02/28/2021   Influenza, High Dose Seasonal PF 03/31/2016, 02/21/2017, 02/12/2018   Influenza,inj,Quad PF,6+ Mos 04/21/2015   Influenza-Unspecified 04/21/2015   Moderna Sars-Covid-2 Vaccination 06/15/2019, 07/13/2019, 03/30/2020   Pneumococcal Conjugate-13 03/07/2014   Pneumococcal Polysaccharide-23 12/04/2011   Tdap 02/23/2011    Health Maintenance  Topic Date Due   Zoster Vaccines- Shingrix (1 of 2) Never done   FOOT EXAM  03/23/2020   COVID-19 Vaccine (4 - Booster for Moderna series) 07/31/2020   TETANUS/TDAP  02/22/2021   HEMOGLOBIN A1C  02/23/2021   OPHTHALMOLOGY EXAM  04/10/2021   INFLUENZA VACCINE  Completed   HPV VACCINES  Aged Out   1. Encounter for Medicare annual wellness exam  - Lipid panel - TSH - CBC w/Diff/Platelet - Comprehensive Metabolic Panel (CMET) - Hemoglobin A1c  2. Annual physical exam   3. Type 2 diabetes mellitus with diabetic neuropathy, without long-term current use of insulin (HCC)  - Lipid panel - TSH - CBC w/Diff/Platelet - Comprehensive Metabolic Panel (CMET) - Hemoglobin A1c  4. Essential (primary) hypertension  - Lipid panel - TSH - CBC  w/Diff/Platelet - Comprehensive Metabolic Panel (CMET) - Hemoglobin A1c  5. Other hyperlipidemia  - Lipid panel - TSH - CBC w/Diff/Platelet - Comprehensive Metabolic Panel (CMET) - Hemoglobin A1c  6. Hyperthyroidism  - Lipid panel - TSH - CBC w/Diff/Platelet - Comprehensive Metabolic Panel (CMET) - Hemoglobin A1c  7. Neuropathy With foot drop also. - Lipid panel - TSH - CBC w/Diff/Platelet - Comprehensive Metabolic Panel (CMET) - Hemoglobin A1c  8. Arteriosclerosis of coronary artery All risk factors treated - Lipid panel - TSH - CBC w/Diff/Platelet - Comprehensive Metabolic Panel (CMET) - Hemoglobin A1c  9. Need for influenza vaccination  - Flu Vaccine QUAD High Dose(Fluad)   Discussed health benefits of physical activity, and encouraged him to engage in regular exercise appropriate for his age and condition.   Return in about 5 months (around 07/31/2021).     I, Todd Durie, MD, have reviewed all documentation for this visit. The documentation on 03/02/21 for the exam, diagnosis, procedures, and orders are all accurate and complete.    Teela Narducci Cranford Mon, MD  [facility] [phone] (phone) [phone] (fax)  [place]

## 2021-02-28 ENCOUNTER — Other Ambulatory Visit: Payer: Self-pay

## 2021-02-28 ENCOUNTER — Ambulatory Visit (INDEPENDENT_AMBULATORY_CARE_PROVIDER_SITE_OTHER): Payer: Medicare HMO | Admitting: Family Medicine

## 2021-02-28 ENCOUNTER — Encounter: Payer: Self-pay | Admitting: Family Medicine

## 2021-02-28 VITALS — BP 106/65 | HR 64 | Temp 98.7°F | Resp 16 | Ht 66.0 in | Wt 174.0 lb

## 2021-02-28 DIAGNOSIS — E0590 Thyrotoxicosis, unspecified without thyrotoxic crisis or storm: Secondary | ICD-10-CM | POA: Diagnosis not present

## 2021-02-28 DIAGNOSIS — E1140 Type 2 diabetes mellitus with diabetic neuropathy, unspecified: Secondary | ICD-10-CM | POA: Diagnosis not present

## 2021-02-28 DIAGNOSIS — G629 Polyneuropathy, unspecified: Secondary | ICD-10-CM | POA: Diagnosis not present

## 2021-02-28 DIAGNOSIS — I10 Essential (primary) hypertension: Secondary | ICD-10-CM

## 2021-02-28 DIAGNOSIS — E7849 Other hyperlipidemia: Secondary | ICD-10-CM

## 2021-02-28 DIAGNOSIS — Z23 Encounter for immunization: Secondary | ICD-10-CM | POA: Diagnosis not present

## 2021-02-28 DIAGNOSIS — I2510 Atherosclerotic heart disease of native coronary artery without angina pectoris: Secondary | ICD-10-CM | POA: Diagnosis not present

## 2021-02-28 DIAGNOSIS — Z0000 Encounter for general adult medical examination without abnormal findings: Secondary | ICD-10-CM | POA: Diagnosis not present

## 2021-03-01 DIAGNOSIS — E0590 Thyrotoxicosis, unspecified without thyrotoxic crisis or storm: Secondary | ICD-10-CM | POA: Diagnosis not present

## 2021-03-01 DIAGNOSIS — Z0000 Encounter for general adult medical examination without abnormal findings: Secondary | ICD-10-CM | POA: Diagnosis not present

## 2021-03-01 DIAGNOSIS — E7849 Other hyperlipidemia: Secondary | ICD-10-CM | POA: Diagnosis not present

## 2021-03-01 DIAGNOSIS — I2510 Atherosclerotic heart disease of native coronary artery without angina pectoris: Secondary | ICD-10-CM | POA: Diagnosis not present

## 2021-03-01 DIAGNOSIS — I10 Essential (primary) hypertension: Secondary | ICD-10-CM | POA: Diagnosis not present

## 2021-03-01 DIAGNOSIS — G629 Polyneuropathy, unspecified: Secondary | ICD-10-CM | POA: Diagnosis not present

## 2021-03-01 DIAGNOSIS — E1140 Type 2 diabetes mellitus with diabetic neuropathy, unspecified: Secondary | ICD-10-CM | POA: Diagnosis not present

## 2021-03-02 LAB — COMPREHENSIVE METABOLIC PANEL
ALT: 44 IU/L (ref 0–44)
AST: 55 IU/L — ABNORMAL HIGH (ref 0–40)
Albumin/Globulin Ratio: 1.3 (ref 1.2–2.2)
Albumin: 3.9 g/dL (ref 3.6–4.6)
Alkaline Phosphatase: 104 IU/L (ref 44–121)
BUN/Creatinine Ratio: 16 (ref 10–24)
BUN: 16 mg/dL (ref 8–27)
Bilirubin Total: 0.4 mg/dL (ref 0.0–1.2)
CO2: 22 mmol/L (ref 20–29)
Calcium: 9.3 mg/dL (ref 8.6–10.2)
Chloride: 104 mmol/L (ref 96–106)
Creatinine, Ser: 0.97 mg/dL (ref 0.76–1.27)
Globulin, Total: 2.9 g/dL (ref 1.5–4.5)
Glucose: 167 mg/dL — ABNORMAL HIGH (ref 70–99)
Potassium: 4.2 mmol/L (ref 3.5–5.2)
Sodium: 139 mmol/L (ref 134–144)
Total Protein: 6.8 g/dL (ref 6.0–8.5)
eGFR: 77 mL/min/{1.73_m2} (ref >59)

## 2021-03-02 LAB — LIPID PANEL
Chol/HDL Ratio: 2.3 ratio (ref 0.0–5.0)
Cholesterol, Total: 133 mg/dL (ref 100–199)
HDL: 57 mg/dL (ref >39)
LDL Chol Calc (NIH): 66 mg/dL (ref 0–99)
Triglycerides: 44 mg/dL (ref 0–149)
VLDL Cholesterol Cal: 10 mg/dL (ref 5–40)

## 2021-03-02 LAB — CBC WITH DIFFERENTIAL/PLATELET
Basophils Absolute: 0.1 10*3/uL (ref 0.0–0.2)
Basos: 1 %
EOS (ABSOLUTE): 0.1 10*3/uL (ref 0.0–0.4)
Eos: 2 %
Hematocrit: 43.2 % (ref 37.5–51.0)
Hemoglobin: 14.2 g/dL (ref 13.0–17.7)
Immature Grans (Abs): 0 10*3/uL (ref 0.0–0.1)
Immature Granulocytes: 0 %
Lymphocytes Absolute: 3 10*3/uL (ref 0.7–3.1)
Lymphs: 55 %
MCH: 30.7 pg (ref 26.6–33.0)
MCHC: 32.9 g/dL (ref 31.5–35.7)
MCV: 94 fL (ref 79–97)
Monocytes Absolute: 0.5 10*3/uL (ref 0.1–0.9)
Monocytes: 9 %
Neutrophils Absolute: 1.8 10*3/uL (ref 1.4–7.0)
Neutrophils: 33 %
Platelets: 167 10*3/uL (ref 150–450)
RBC: 4.62 x10E6/uL (ref 4.14–5.80)
RDW: 13.2 % (ref 11.6–15.4)
WBC: 5.5 10*3/uL (ref 3.4–10.8)

## 2021-03-02 LAB — HEMOGLOBIN A1C
Est. average glucose Bld gHb Est-mCnc: 151 mg/dL
Hgb A1c MFr Bld: 6.9 % — ABNORMAL HIGH (ref 4.8–5.6)

## 2021-03-02 LAB — TSH: TSH: 0.968 u[IU]/mL (ref 0.450–4.500)

## 2021-03-06 ENCOUNTER — Telehealth: Payer: Self-pay | Admitting: Family Medicine

## 2021-03-06 NOTE — Telephone Encounter (Signed)
[facility] faxed refill request for the following medications:   lisinopril (PRINIVIL,ZESTRIL) 10 MG tablet   Please advise.

## 2021-03-06 NOTE — Telephone Encounter (Signed)
Lisinopril is not prescribed by Dr. Rosanna Randy.

## 2021-03-12 ENCOUNTER — Other Ambulatory Visit: Payer: Self-pay | Admitting: Family Medicine

## 2021-03-12 NOTE — Telephone Encounter (Signed)
Copied from [place] [id number]. Topic: Quick Communication - Rx Refill/Question >> Mar 12, 2021  4:59 PM Tessa Lerner A wrote: Medication: lisinopril (PRINIVIL,ZESTRIL) 10 MG tablet [[id number]]   Has the patient contacted their pharmacy? Yes.  - patient's pharmacy has reached out on their behalf  (Agent: If no, request that the patient contact the pharmacy for the refill.) (Agent: If yes, when and what did the pharmacy advise?)  Preferred Pharmacy (with phone number or street name): [facility], [place]  Phone:  [phone] Fax:  [phone]  Has the patient been seen for an appointment in the last year OR does the patient have an upcoming appointment? Yes.    Agent: Please be advised that RX refills may take up to 3 business days. We ask that you follow-up with your pharmacy.

## 2021-03-13 NOTE — Telephone Encounter (Signed)
Requested medications are due for refill today.  yes  Requested medications are on the active medications list.  yes  Last refill. 11/23/2016  Future visit scheduled.   yes  Notes to clinic.  Historical medication.

## 2021-03-14 MED ORDER — LISINOPRIL 10 MG PO TABS: 10.0000 mg | ORAL_TABLET | Freq: Every day | ORAL | 1 refills | Status: DC

## 2021-03-30 DIAGNOSIS — Z23 Encounter for immunization: Secondary | ICD-10-CM | POA: Diagnosis not present

## 2021-05-16 DIAGNOSIS — I495 Sick sinus syndrome: Secondary | ICD-10-CM | POA: Diagnosis not present

## 2021-06-19 DIAGNOSIS — I10 Essential (primary) hypertension: Secondary | ICD-10-CM | POA: Diagnosis not present

## 2021-06-19 DIAGNOSIS — E782 Mixed hyperlipidemia: Secondary | ICD-10-CM | POA: Diagnosis not present

## 2021-06-19 DIAGNOSIS — I2510 Atherosclerotic heart disease of native coronary artery without angina pectoris: Secondary | ICD-10-CM | POA: Diagnosis not present

## 2021-06-19 DIAGNOSIS — R0609 Other forms of dyspnea: Secondary | ICD-10-CM | POA: Diagnosis not present

## 2021-06-19 DIAGNOSIS — E119 Type 2 diabetes mellitus without complications: Secondary | ICD-10-CM | POA: Diagnosis not present

## 2021-06-19 DIAGNOSIS — I495 Sick sinus syndrome: Secondary | ICD-10-CM | POA: Diagnosis not present

## 2021-06-19 DIAGNOSIS — Z950 Presence of cardiac pacemaker: Secondary | ICD-10-CM | POA: Diagnosis not present

## 2021-07-30 NOTE — Progress Notes (Signed)
Argentina Ponder DeSanto,acting as a scribe for [name], MD.,have documented all relevant documentation on the behalf of [name], MD,as directed by  [name], MD while in the presence of [name], MD.     Established patient visit   Patient: Todd Ortega   DOB: [birth year]   87 y.o. Male  MRN: [id number] Visit Date: 07/31/2021  Today's healthcare provider: [name], MD   No chief complaint on file.  Subjective    HPI  Patient is a delightful gentleman who has been married for 66 years.  He has 2 sons and 6 grandchildren.  He feels well overall.  He continues to work part-time little bit.  He has no complaints today.  He is taking his medications.  He has had no falls.  Hypertension, follow-up  BP Readings from Last 3 Encounters:  07/31/21 116/74  02/28/21 106/65  10/18/20 (!) 144/64   Wt Readings from Last 3 Encounters:  07/31/21 172 lb (78 kg)  02/28/21 174 lb (78.9 kg)  10/18/20 175 lb (79.4 kg)     He was last seen for hypertension 6 months ago.  BP at that visit was as above. Management since that visit includes none.  He reports good compliance with treatment. He is not having side effects.  He is following a Regular diet. He is not exercising. He does not smoke.  Use of agents associated with hypertension: none.   Outside blood pressures are . Symptoms: No chest pain No chest pressure  No palpitations No syncope  No dyspnea No orthopnea  No paroxysmal nocturnal dyspnea No lower extremity edema   Pertinent labs: Lab Results  Component Value Date   CHOL 133 03/01/2021   HDL 57 03/01/2021   LDLCALC 66 03/01/2021   TRIG 44 03/01/2021   CHOLHDL 2.3 03/01/2021   Lab Results  Component Value Date   NA 139 03/01/2021   K 4.2 03/01/2021   CREATININE 0.97 03/01/2021   EGFR 77 03/01/2021   GLUCOSE 167 (H) 03/01/2021   TSH 0.968 03/01/2021     The ASCVD Risk score (Arnett DK, et al., 2019) failed to calculate  for the following reasons:   The 2019 ASCVD risk score is only valid for ages 19 to 59   --------------------------------------------------------------------------------------------------- Diabetes Mellitus Type II, Follow-up  Lab Results  Component Value Date   HGBA1C 6.5 (A) 07/31/2021   HGBA1C 6.9 (H) 03/01/2021   HGBA1C 6.3 (A) 08/23/2020   Wt Readings from Last 3 Encounters:  07/31/21 172 lb (78 kg)  02/28/21 174 lb (78.9 kg)  10/18/20 175 lb (79.4 kg)   Last seen for diabetes 6 months ago.  Management since then includes none. He reports good compliance with treatment. He is having side effects.  Symptoms: No fatigue No foot ulcerations  No appetite changes No nausea  No paresthesia of the feet  No polydipsia  No polyuria No visual disturbances   No vomiting     Home blood sugar records:  not being checked  Episodes of hypoglycemia? No    Current insulin regiment: none Most Recent Eye Exam: due  Pertinent Labs: Lab Results  Component Value Date   CHOL 133 03/01/2021   HDL 57 03/01/2021   LDLCALC 66 03/01/2021   TRIG 44 03/01/2021   CHOLHDL 2.3 03/01/2021   Lab Results  Component Value Date   NA 139 03/01/2021   K 4.2 03/01/2021   CREATININE 0.97 03/01/2021   EGFR 77 03/01/2021  MICROALBUR 100 09/10/2016   LABMICR See below: 06/05/2020     ---------------------------------------------------------------------------------------------------   Medications: Outpatient Medications Prior to Visit  Medication Sig   acetaminophen (TYLENOL) 650 MG CR tablet Take 650 mg by mouth every 8 (eight) hours as needed for pain.   ascorbic acid (VITAMIN C) 500 MG tablet Take 500 mg by mouth daily.   aspirin EC 81 MG tablet Take 81 mg by mouth at bedtime. Swallow whole.   Calcium Carb-Cholecalciferol (CALCIUM 500+D3 PO) Take 1 tablet by mouth daily.   finasteride (PROSCAR) 5 MG tablet Take 1 tablet (5 mg total) by mouth at bedtime.   gabapentin (NEURONTIN) 300 MG  capsule Take 1 capsule (300 mg total) by mouth daily. (Patient taking differently: Take 300 mg by mouth in the morning and at bedtime.)   glucose blood test strip BAYER CONTOUR TEST (In Vitro Strip)  1 (one) Strip Strip check sugars once daily, or as needed for 0 days  Quantity: 300;  Refills: 4   Ordered :31-Mar-2013  [name] ;  Started 31-Mar-2013 Active Comments: Medication taken as needed.   lisinopril (ZESTRIL) 10 MG tablet Take 1 tablet (10 mg total) by mouth at bedtime.   loratadine (CLARITIN) 10 MG tablet Take 1 tablet (10 mg total) by mouth daily.   rosuvastatin (CRESTOR) 40 MG tablet Take 1 tablet (40 mg total) by mouth daily.   tamsulosin (FLOMAX) 0.4 MG CAPS capsule Take 1 capsule (0.4 mg total) by mouth daily after breakfast.   No facility-administered medications prior to visit.    Review of Systems  Last hemoglobin A1c Lab Results  Component Value Date   HGBA1C 6.5 (A) 07/31/2021       Objective    BP 116/74 (BP Location: Left Arm, Patient Position: Sitting, Cuff Size: Normal)    Pulse 67    Temp 98.4 F (36.9 C) (Oral)    Wt 172 lb (78 kg)    SpO2 97%    BMI 27.76 kg/m  BP Readings from Last 3 Encounters:  07/31/21 116/74  02/28/21 106/65  10/18/20 (!) 144/64   Wt Readings from Last 3 Encounters:  07/31/21 172 lb (78 kg)  02/28/21 174 lb (78.9 kg)  10/18/20 175 lb (79.4 kg)      Physical Exam Vitals and nursing note reviewed.  Constitutional:      Appearance: Normal appearance. He is well-developed.     Comments: Patient appears younger than his age of 53.  HENT:     Head: Normocephalic and atraumatic.     Right Ear: External ear normal.     Left Ear: External ear normal.     Nose: Nose normal.  Eyes:     Conjunctiva/sclera: Conjunctivae normal.     Pupils: Pupils are equal, round, and reactive to light.  Cardiovascular:     Rate and Rhythm: Normal rate and regular rhythm.     Heart sounds: Normal heart sounds.  Pulmonary:     Effort:  Pulmonary effort is normal.     Breath sounds: Normal breath sounds.  Abdominal:     General: Bowel sounds are normal.     Palpations: Abdomen is soft.  Genitourinary:    Penis: Normal.      Prostate: Normal.     Rectum: Normal.  Musculoskeletal:        General: Normal range of motion.     Cervical back: Neck supple.  Skin:    General: Skin is warm and dry.  Neurological:  Mental Status: He is alert and oriented to person, place, and time.     Cranial Nerves: No cranial nerve deficit.     Comments: Pt has difficulty with walking. He has a right foot drop. He has decreased sensation to both feet.  Psychiatric:        Mood and Affect: Mood normal.        Behavior: Behavior normal.        Thought Content: Thought content normal.        Judgment: Judgment normal.      Results for orders placed or performed in visit on 07/31/21  POCT glycosylated hemoglobin (Hb A1C)  Result Value Ref Range   Hemoglobin A1C 6.5 (A) 4.0 - 5.6 %   HbA1c POC (<> result, manual entry)     HbA1c, POC (prediabetic range)     HbA1c, POC (controlled diabetic range)      Assessment & Plan     1. Type 2 diabetes mellitus with diabetic neuropathy, without long-term current use of insulin (HCC) A1c under good control at 6.5.  Continue diet and exercise.  A1c in 3 to 6 months - POCT glycosylated hemoglobin (Hb A1C)  2. Essential (primary) hypertension Good control on lisinopril  3. Arteriosclerosis of coronary artery All risk factors treated with patient on rosuvastatin 40  4. CHB (complete heart block) (HCC) Followed by cardiology/SSS Pacemaker in place 5. Kidney stone History of kidney stones.  6. Foot drop, right   7. Abnormal prostate specific antigen Patient now on finasteride for BPH.  No PSAs unless he becomes more symptomatic   No follow-ups on file.      I, [name], MD, have reviewed all documentation for this visit. The documentation on 08/02/21 for the exam,  diagnosis, procedures, and orders are all accurate and complete.    Eleanore Junio Cranford Mon, MD  [facility] [place] [phone] (phone) [phone] (fax)  [place]

## 2021-07-31 ENCOUNTER — Other Ambulatory Visit: Payer: Self-pay

## 2021-07-31 ENCOUNTER — Ambulatory Visit (INDEPENDENT_AMBULATORY_CARE_PROVIDER_SITE_OTHER): Payer: Medicare HMO | Admitting: Family Medicine

## 2021-07-31 ENCOUNTER — Encounter: Payer: Self-pay | Admitting: Family Medicine

## 2021-07-31 VITALS — BP 116/74 | HR 67 | Temp 98.4°F | Wt 172.0 lb

## 2021-07-31 DIAGNOSIS — M21371 Foot drop, right foot: Secondary | ICD-10-CM

## 2021-07-31 DIAGNOSIS — I2510 Atherosclerotic heart disease of native coronary artery without angina pectoris: Secondary | ICD-10-CM

## 2021-07-31 DIAGNOSIS — E1140 Type 2 diabetes mellitus with diabetic neuropathy, unspecified: Secondary | ICD-10-CM

## 2021-07-31 DIAGNOSIS — I442 Atrioventricular block, complete: Secondary | ICD-10-CM

## 2021-07-31 DIAGNOSIS — I10 Essential (primary) hypertension: Secondary | ICD-10-CM | POA: Diagnosis not present

## 2021-07-31 DIAGNOSIS — N200 Calculus of kidney: Secondary | ICD-10-CM | POA: Diagnosis not present

## 2021-07-31 DIAGNOSIS — R972 Elevated prostate specific antigen [PSA]: Secondary | ICD-10-CM

## 2021-07-31 LAB — POCT GLYCOSYLATED HEMOGLOBIN (HGB A1C): Hemoglobin A1C: 6.5 % — AB (ref 4.0–5.6)

## 2021-09-05 ENCOUNTER — Other Ambulatory Visit: Payer: Self-pay | Admitting: Family Medicine

## 2021-09-11 ENCOUNTER — Other Ambulatory Visit: Payer: Self-pay | Admitting: Family Medicine

## 2021-09-11 DIAGNOSIS — E7849 Other hyperlipidemia: Secondary | ICD-10-CM

## 2021-10-16 ENCOUNTER — Other Ambulatory Visit: Payer: Medicare HMO

## 2021-10-16 DIAGNOSIS — N1330 Unspecified hydronephrosis: Secondary | ICD-10-CM

## 2021-10-17 LAB — BASIC METABOLIC PANEL
BUN/Creatinine Ratio: 21 (ref 10–24)
BUN: 16 mg/dL (ref 8–27)
CO2: 22 mmol/L (ref 20–29)
Calcium: 9.8 mg/dL (ref 8.6–10.2)
Chloride: 100 mmol/L (ref 96–106)
Creatinine, Ser: 0.77 mg/dL (ref 0.76–1.27)
Glucose: 175 mg/dL — ABNORMAL HIGH (ref 70–99)
Potassium: 4.2 mmol/L (ref 3.5–5.2)
Sodium: 136 mmol/L (ref 134–144)
eGFR: 88 mL/min/{1.73_m2} (ref >59)

## 2021-10-18 ENCOUNTER — Ambulatory Visit: Payer: Self-pay | Admitting: Urology

## 2021-11-08 ENCOUNTER — Encounter: Payer: Self-pay | Admitting: Urology

## 2021-11-08 ENCOUNTER — Ambulatory Visit: Payer: Medicare HMO | Admitting: Urology

## 2021-11-08 VITALS — BP 131/72 | HR 64 | Ht 66.0 in | Wt 172.0 lb

## 2021-11-08 DIAGNOSIS — N200 Calculus of kidney: Secondary | ICD-10-CM | POA: Diagnosis not present

## 2021-11-08 DIAGNOSIS — N138 Other obstructive and reflux uropathy: Secondary | ICD-10-CM

## 2021-11-08 DIAGNOSIS — N401 Enlarged prostate with lower urinary tract symptoms: Secondary | ICD-10-CM | POA: Diagnosis not present

## 2021-11-08 LAB — BLADDER SCAN AMB NON-IMAGING

## 2021-11-08 MED ORDER — TAMSULOSIN HCL 0.4 MG PO CAPS: 0.4000 mg | ORAL_CAPSULE | Freq: Every day | ORAL | 3 refills | Status: DC

## 2021-11-08 MED ORDER — FINASTERIDE 5 MG PO TABS: 5.0000 mg | ORAL_TABLET | Freq: Every day | ORAL | 3 refills | Status: DC

## 2021-11-08 NOTE — Progress Notes (Signed)
   11/08/2021 12:41 PM   Lang Snow 1934-09-08 [id number]  Reason for visit: Follow up nephrolithiasis, BPH  HPI: He is an 87 year old male with a long history of BPH on maximal medical therapy with Flomax and finasteride who presented with a right distal ureteral stone and sepsis from urinary source in December 2021 and underwent emergent stent placement.  He underwent follow-up definitive ureteroscopy on 06/16/2020.  He has a very large prostate measuring 80 g on prior CT with a very large median lobe.   He had a renal ultrasound performed on 07/17/2020 for standard follow-up after ureteroscopy and this showed bilateral mild hydronephrosis all the way down to the distal ureter with large prostate.  Suspect this is related to incomplete bladder emptying with reflux.  These findings were stable and a repeat ultrasound in May 2022, and PVR at that time was 200 mL.  We had a long conversation about his incomplete bladder emptying with hydronephrosis, and I recommended considering HOLEP or intermittent catheterization, but he deferred.    He continues to do well, and really denies urinary complaints on maximal medical therapy with the Flomax and finasteride.  PVR is actually normal today at 12m, and renal function remains normal from 10/16/2021 with creatinine of 0.77, EGFR greater than 60 which is stable from prior values.  I had ordered a ultrasound to be completed prior to this visit to monitor his hydronephrosis, but this was not completed.  He denies any flank pain, stone episodes, or gross hematuria over the last year.  With his absence of urinary symptoms, improved emptying on PVR today, and at our visit last year, and normal renal function, I think it is reasonable to continue observation with maximal medical therapy alone for his BPH at this time.  Return precautions discussed extensively including UTIs, retention, worsening renal function, or flank pain.  I also again recommended repeating a  renal ultrasound prior to our next visit to monitor his hydronephrosis.  Flomax and finasteride refilled RTC 1 year with renal ultrasound prior, PVR   BBilley Co MD  [address] [place] [phone]

## 2021-12-11 ENCOUNTER — Encounter: Payer: Self-pay | Admitting: Family Medicine

## 2021-12-11 ENCOUNTER — Ambulatory Visit (INDEPENDENT_AMBULATORY_CARE_PROVIDER_SITE_OTHER): Payer: Medicare HMO | Admitting: Family Medicine

## 2021-12-11 VITALS — BP 125/70 | HR 69 | Temp 98.6°F | Resp 16 | Ht 66.0 in | Wt 170.7 lb

## 2021-12-11 DIAGNOSIS — E1140 Type 2 diabetes mellitus with diabetic neuropathy, unspecified: Secondary | ICD-10-CM

## 2021-12-11 DIAGNOSIS — I2510 Atherosclerotic heart disease of native coronary artery without angina pectoris: Secondary | ICD-10-CM | POA: Diagnosis not present

## 2021-12-11 DIAGNOSIS — G629 Polyneuropathy, unspecified: Secondary | ICD-10-CM | POA: Diagnosis not present

## 2021-12-11 DIAGNOSIS — I442 Atrioventricular block, complete: Secondary | ICD-10-CM

## 2021-12-11 DIAGNOSIS — E7849 Other hyperlipidemia: Secondary | ICD-10-CM | POA: Diagnosis not present

## 2021-12-11 DIAGNOSIS — N400 Enlarged prostate without lower urinary tract symptoms: Secondary | ICD-10-CM | POA: Diagnosis not present

## 2021-12-11 DIAGNOSIS — E0590 Thyrotoxicosis, unspecified without thyrotoxic crisis or storm: Secondary | ICD-10-CM

## 2021-12-11 DIAGNOSIS — I10 Essential (primary) hypertension: Secondary | ICD-10-CM | POA: Diagnosis not present

## 2021-12-11 NOTE — Progress Notes (Unsigned)
I,[name],acting as a scribe for Todd Durie, MD.,have documented all relevant documentation on the behalf of Todd Durie, MD,as directed by  Todd Durie, MD while in the presence of Todd Durie, MD.   Established patient visit   Patient: Todd Ortega   DOB: 1934-07-06   87 y.o. Male  MRN: [id number] Visit Date: 12/11/2021  Today's healthcare provider: [name], MD   Chief Complaint  Patient presents with   Follow-up   Subjective    HPI  Overall patient is feeling fairly well.  He now walks with a walker because of his chronic neuropathy and foot drop. He says he always sweats a lot but his wife wanted him to mention it today.  It is no different than in past years.  Is having no chest pain shortness of breath dyspnea on exertion PND or weight loss. Diabetes Mellitus Type II, follow-up  Lab Results  Component Value Date   HGBA1C 6.5 (A) 07/31/2021   HGBA1C 6.9 (H) 03/01/2021   HGBA1C 6.3 (A) 08/23/2020   Last seen for diabetes 5 months ago.  Management since then includes continuing the same treatment of diet and exercise.  Home blood sugar records:  not being checked Most Recent Eye Exam: Last year with Dr. Orion Modest  --------------------------------------------------------------------------------------------------- Hypertension, follow-up  BP Readings from Last 3 Encounters:  12/11/21 125/70  11/08/21 131/72  07/31/21 116/74   Wt Readings from Last 3 Encounters:  12/11/21 170 lb 11.2 oz (77.4 kg)  11/08/21 172 lb (78 kg)  07/31/21 172 lb (78 kg)     He was last seen for hypertension 5 months ago.  Management since that visit includes; Good control on lisinopril. Outside blood pressures are 130/60's.  --------------------------------------------------------------------------------------------------- Lipid/Cholesterol, follow-up  Last Lipid Panel: Lab Results  Component Value Date   CHOL 133 03/01/2021    LDLCALC 66 03/01/2021   HDL 57 03/01/2021   TRIG 44 03/01/2021    He was last seen for this 10 months ago.  Management since that visit includes; on rosuvastatin.  Last metabolic panel Lab Results  Component Value Date   GLUCOSE 175 (H) 10/16/2021   NA 136 10/16/2021   K 4.2 10/16/2021   BUN 16 10/16/2021   CREATININE 0.77 10/16/2021   EGFR 88 10/16/2021   GFRNONAA >60 06/14/2020   CALCIUM 9.8 10/16/2021   AST 55 (H) 03/01/2021   ALT 44 03/01/2021   The ASCVD Risk score (Arnett DK, et al., 2019) failed to calculate for the following reasons:   The 2019 ASCVD risk score is only valid for ages 21 to 6  ---------------------------------------------------------------------------------------------------   Medications: Outpatient Medications Prior to Visit  Medication Sig   acetaminophen (TYLENOL) 650 MG CR tablet Take 650 mg by mouth every 8 (eight) hours as needed for pain.   ascorbic acid (VITAMIN C) 500 MG tablet Take 500 mg by mouth daily.   aspirin EC 81 MG tablet Take 81 mg by mouth at bedtime. Swallow whole.   Calcium Carb-Cholecalciferol (CALCIUM 500+D3 PO) Take 1 tablet by mouth daily.   finasteride (PROSCAR) 5 MG tablet Take 1 tablet (5 mg total) by mouth at bedtime.   gabapentin (NEURONTIN) 300 MG capsule Take 1 capsule (300 mg total) by mouth daily.   glucose blood test strip BAYER CONTOUR TEST (In Vitro Strip)  1 (one) Strip Strip check sugars once daily, or as needed for 0 days  Quantity: 300;  Refills: 4   Ordered :  31-Mar-2013  DeSanto, Elena ;  Started 31-Mar-2013 Active Comments: Medication taken as needed.   lisinopril (ZESTRIL) 10 MG tablet TAKE 1 TABLET AT BEDTIME   loratadine (CLARITIN) 10 MG tablet Take 1 tablet (10 mg total) by mouth daily.   rosuvastatin (CRESTOR) 40 MG tablet TAKE 1 TABLET (40 MG TOTAL) BY MOUTH DAILY.   tamsulosin (FLOMAX) 0.4 MG CAPS capsule Take 1 capsule (0.4 mg total) by mouth daily after breakfast.   No facility-administered  medications prior to visit.    Review of Systems  Constitutional:  Negative for appetite change, chills and fever.  Respiratory:  Negative for chest tightness, shortness of breath and wheezing.   Cardiovascular:  Negative for chest pain and palpitations.  Gastrointestinal:  Negative for abdominal pain, nausea and vomiting.    Last hemoglobin A1c Lab Results  Component Value Date   HGBA1C 7.4 (H) 12/11/2021       Objective    BP 125/70 (BP Location: Left Arm, Patient Position: Sitting, Cuff Size: Large)   Pulse 69   Temp 98.6 F (37 C) (Oral)   Resp 16   Ht 5' 6" (1.676 m)   Wt 170 lb 11.2 oz (77.4 kg)   BMI 27.55 kg/m  BP Readings from Last 3 Encounters:  12/11/21 125/70  11/08/21 131/72  07/31/21 116/74   Wt Readings from Last 3 Encounters:  12/11/21 170 lb 11.2 oz (77.4 kg)  11/08/21 172 lb (78 kg)  07/31/21 172 lb (78 kg)      Physical Exam Vitals and nursing note reviewed.  Constitutional:      Appearance: Normal appearance. He is well-developed.     Comments: Patient appears younger than his age of 86.  HENT:     Head: Normocephalic and atraumatic.     Right Ear: External ear normal.     Left Ear: External ear normal.     Nose: Nose normal.  Eyes:     Conjunctiva/sclera: Conjunctivae normal.     Pupils: Pupils are equal, round, and reactive to light.  Cardiovascular:     Rate and Rhythm: Normal rate and regular rhythm.     Heart sounds: Normal heart sounds.  Pulmonary:     Effort: Pulmonary effort is normal.     Breath sounds: Normal breath sounds.  Abdominal:     General: Bowel sounds are normal.     Palpations: Abdomen is soft.  Musculoskeletal:     Cervical back: Neck supple.  Skin:    General: Skin is warm and dry.  Neurological:     Mental Status: He is alert and oriented to person, place, and time.     Cranial Nerves: No cranial nerve deficit.     Comments: Pt has difficulty with walking. He has a right foot drop. He has decreased  sensation to both feet. He has poor balance and has to walk with a cane with some difficult  Psychiatric:        Mood and Affect: Mood normal.        Behavior: Behavior normal.        Thought Content: Thought content normal.        Judgment: Judgment normal.       No results found for any visits on 12/11/21.  Assessment & Plan     1. Type 2 diabetes mellitus with diabetic neuropathy, without long-term current use of insulin (HCC) Goal A1c less than 8-8.5 in this patient.  Avoid hypoglycemia.  He is not on anything that   should cause this.  Follow-up 4 months. - Lipid panel - Hemoglobin A1c - CBC w/Diff/Platelet - Comprehensive Metabolic Panel (CMET) - TSH - Urine Microalbumin w/creat. ratio  2. Essential (primary) hypertension Good blood pressure control - Lipid panel - Hemoglobin A1c - CBC w/Diff/Platelet - Comprehensive Metabolic Panel (CMET) - TSH - Urine Microalbumin w/creat. ratio  3. Other hyperlipidemia On rosuvastatin 40 - Lipid panel - Hemoglobin A1c - CBC w/Diff/Platelet - Comprehensive Metabolic Panel (CMET) - TSH  4. Arteriosclerosis of coronary artery All risk factors treated - Lipid panel - Hemoglobin A1c - CBC w/Diff/Platelet - Comprehensive Metabolic Panel (CMET) - TSH  5. Hyperthyroidism  - Lipid panel - Hemoglobin A1c - CBC w/Diff/Platelet - Comprehensive Metabolic Panel (CMET) - TSH  6. Neuropathy Follow-up for euthyroid TSH  7. CHB (complete heart block) (HCC)  followed by Dr. Callwood  8. Benign fibroma of prostate Followed by urology   No follow-ups on file.      I, Richard Gilbert Jr, MD, have reviewed all documentation for this visit. The documentation on 12/12/21 for the exam, diagnosis, procedures, and orders are all accurate and complete.    Richard Gilbert Jr, MD  [facility] [phone] (phone) [phone] (fax)  [facility] 

## 2021-12-12 LAB — CBC WITH DIFFERENTIAL/PLATELET
Basophils Absolute: 0 10*3/uL (ref 0.0–0.2)
Basos: 1 %
EOS (ABSOLUTE): 0.1 10*3/uL (ref 0.0–0.4)
Eos: 1 %
Hematocrit: 43.8 % (ref 37.5–51.0)
Hemoglobin: 14.8 g/dL (ref 13.0–17.7)
Immature Grans (Abs): 0 10*3/uL (ref 0.0–0.1)
Immature Granulocytes: 0 %
Lymphocytes Absolute: 2.7 10*3/uL (ref 0.7–3.1)
Lymphs: 52 %
MCH: 31.7 pg (ref 26.6–33.0)
MCHC: 33.8 g/dL (ref 31.5–35.7)
MCV: 94 fL (ref 79–97)
Monocytes Absolute: 0.5 10*3/uL (ref 0.1–0.9)
Monocytes: 9 %
Neutrophils Absolute: 2 10*3/uL (ref 1.4–7.0)
Neutrophils: 37 %
Platelets: 188 10*3/uL (ref 150–450)
RBC: 4.67 x10E6/uL (ref 4.14–5.80)
RDW: 12.7 % (ref 11.6–15.4)
WBC: 5.3 10*3/uL (ref 3.4–10.8)

## 2021-12-12 LAB — LIPID PANEL
Chol/HDL Ratio: 2.4 ratio (ref 0.0–5.0)
Cholesterol, Total: 143 mg/dL (ref 100–199)
HDL: 59 mg/dL (ref >39)
LDL Chol Calc (NIH): 73 mg/dL (ref 0–99)
Triglycerides: 53 mg/dL (ref 0–149)
VLDL Cholesterol Cal: 11 mg/dL (ref 5–40)

## 2021-12-12 LAB — COMPREHENSIVE METABOLIC PANEL
ALT: 44 IU/L (ref 0–44)
AST: 44 IU/L — ABNORMAL HIGH (ref 0–40)
Albumin/Globulin Ratio: 1.2 (ref 1.2–2.2)
Albumin: 4.1 g/dL (ref 3.7–4.7)
Alkaline Phosphatase: 112 IU/L (ref 44–121)
BUN/Creatinine Ratio: 15 (ref 10–24)
BUN: 13 mg/dL (ref 8–27)
Bilirubin Total: 0.4 mg/dL (ref 0.0–1.2)
CO2: 21 mmol/L (ref 20–29)
Calcium: 9.7 mg/dL (ref 8.6–10.2)
Chloride: 100 mmol/L (ref 96–106)
Creatinine, Ser: 0.86 mg/dL (ref 0.76–1.27)
Globulin, Total: 3.3 g/dL (ref 1.5–4.5)
Glucose: 166 mg/dL — ABNORMAL HIGH (ref 70–99)
Potassium: 4.5 mmol/L (ref 3.5–5.2)
Sodium: 137 mmol/L (ref 134–144)
Total Protein: 7.4 g/dL (ref 6.0–8.5)
eGFR: 85 mL/min/{1.73_m2} (ref >59)

## 2021-12-12 LAB — TSH: TSH: 0.473 u[IU]/mL (ref 0.450–4.500)

## 2021-12-12 LAB — MICROALBUMIN / CREATININE URINE RATIO
Creatinine, Urine: 122.1 mg/dL
Microalb/Creat Ratio: 13 mg/g creat (ref 0–29)
Microalbumin, Urine: 16 ug/mL

## 2021-12-12 LAB — HEMOGLOBIN A1C
Est. average glucose Bld gHb Est-mCnc: 166 mg/dL
Hgb A1c MFr Bld: 7.4 % — ABNORMAL HIGH (ref 4.8–5.6)

## 2021-12-17 DIAGNOSIS — E119 Type 2 diabetes mellitus without complications: Secondary | ICD-10-CM | POA: Diagnosis not present

## 2021-12-17 DIAGNOSIS — R0609 Other forms of dyspnea: Secondary | ICD-10-CM | POA: Diagnosis not present

## 2021-12-17 DIAGNOSIS — I495 Sick sinus syndrome: Secondary | ICD-10-CM | POA: Diagnosis not present

## 2021-12-17 DIAGNOSIS — I2510 Atherosclerotic heart disease of native coronary artery without angina pectoris: Secondary | ICD-10-CM | POA: Diagnosis not present

## 2021-12-17 DIAGNOSIS — I10 Essential (primary) hypertension: Secondary | ICD-10-CM | POA: Diagnosis not present

## 2021-12-17 DIAGNOSIS — Z950 Presence of cardiac pacemaker: Secondary | ICD-10-CM | POA: Diagnosis not present

## 2021-12-17 DIAGNOSIS — E782 Mixed hyperlipidemia: Secondary | ICD-10-CM | POA: Diagnosis not present

## 2021-12-19 DIAGNOSIS — I495 Sick sinus syndrome: Secondary | ICD-10-CM | POA: Diagnosis not present

## 2021-12-19 DIAGNOSIS — Z950 Presence of cardiac pacemaker: Secondary | ICD-10-CM | POA: Diagnosis not present

## 2022-02-19 IMAGING — CT CT ABD-PELV W/O CM
2 of 4 series · 15 of 46 positions shown, 17 images · non-contrast
Comparison: Remote abdominal CT 03/10/2008

CLINICAL DATA: Acute nonlocalized abdominal pain. Patient reports
lower abdominal pain onset last night.

EXAM:
CT ABDOMEN AND PELVIS WITHOUT CONTRAST
TECHNIQUE: Multidetector CT imaging of the abdomen and pelvis was performed
following the standard protocol without IV contrast.

[Series 2: routine abd/pel wo · axial · 0.77mm/px · z∈[-438,-43]mm · 12 of 91 slices shown, 14 images]
[im 8/91  soft-tissue]
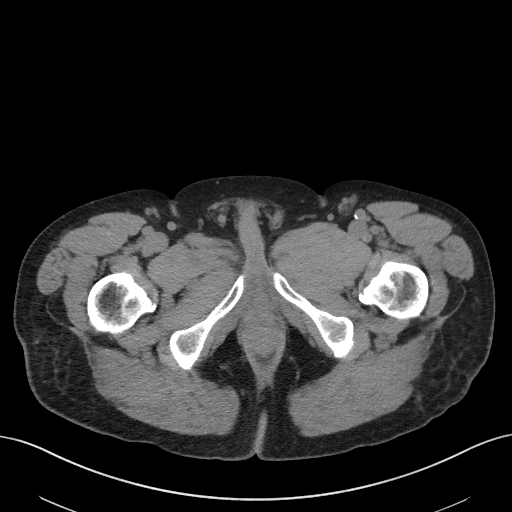
[im 8/91  bone]
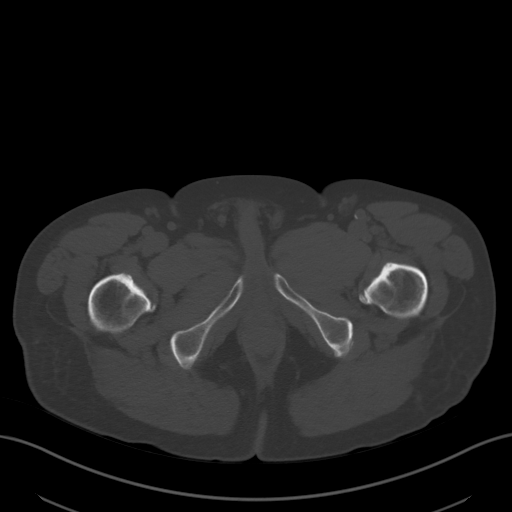
[im 15/91  soft-tissue]
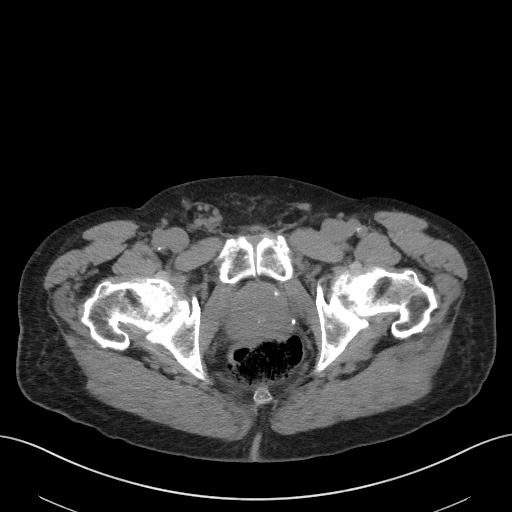
[im 22/91  soft-tissue]
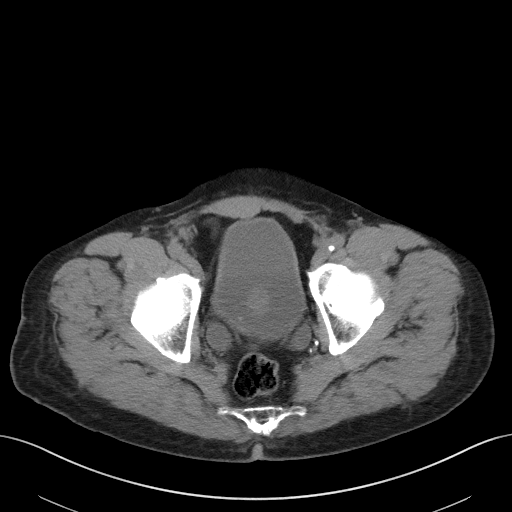
[im 29/91  soft-tissue]
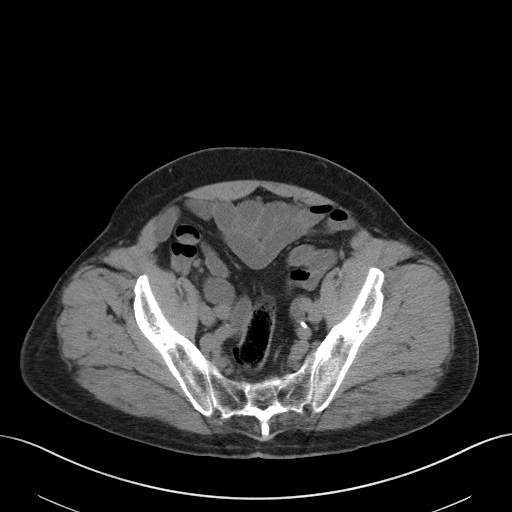
[im 37/91  soft-tissue]
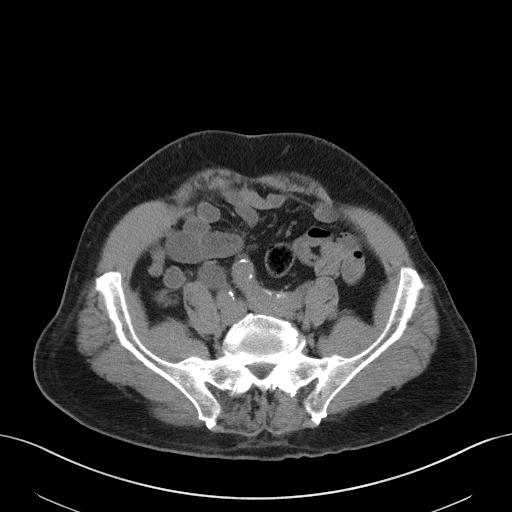
[im 44/91  soft-tissue]
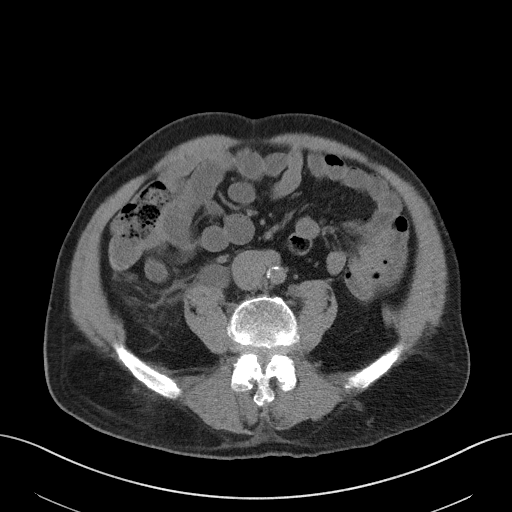
[im 51/91  soft-tissue]
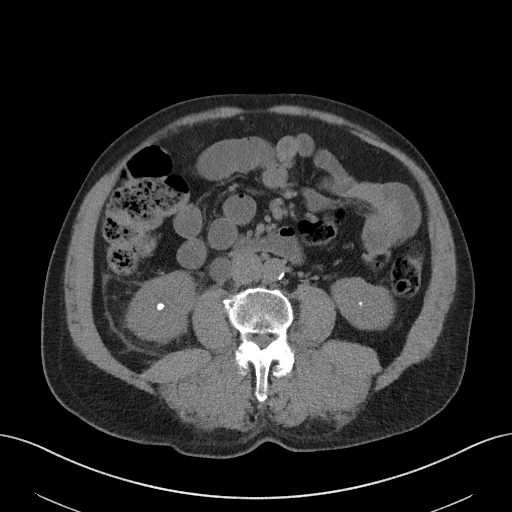
[im 58/91  soft-tissue]
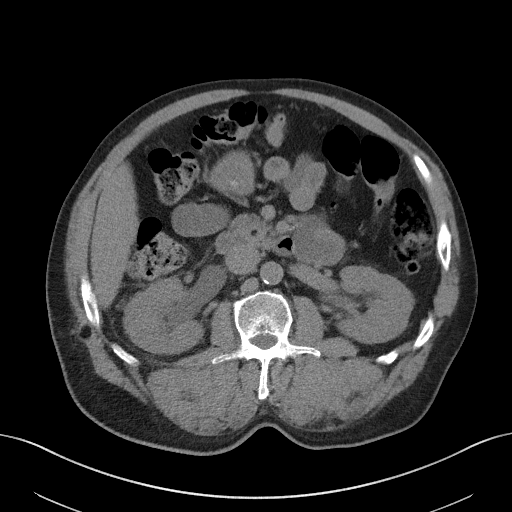
[im 65/91  soft-tissue]
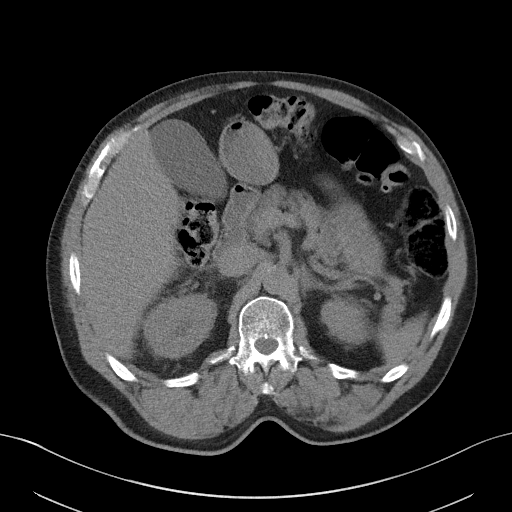
[im 65/91  bone]
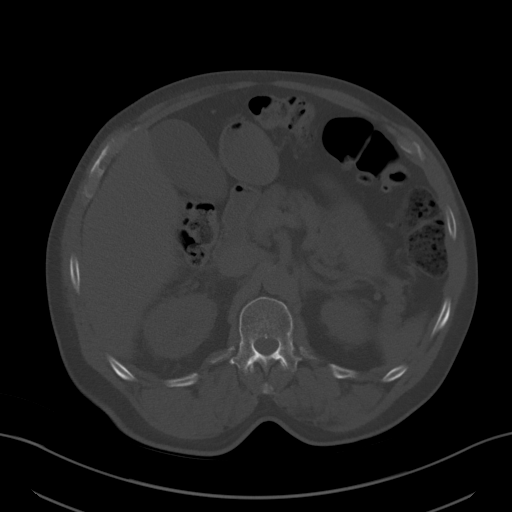
[im 73/91  soft-tissue]
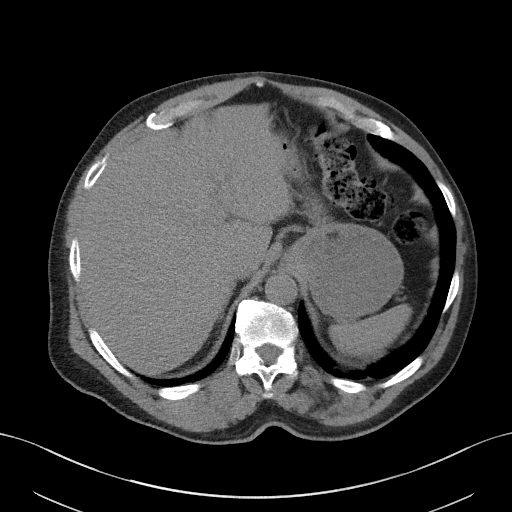
[im 80/91  soft-tissue]
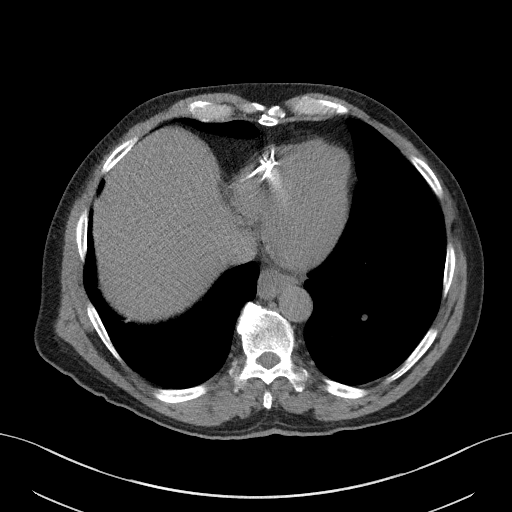
[im 87/91  soft-tissue]
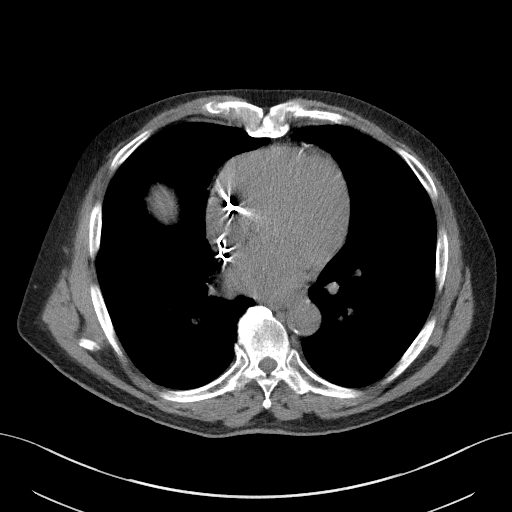

[Series 5: coronal st · coronal · 0.68mm/px · 3 of 103 slices shown]
[im 35/103  soft-tissue]
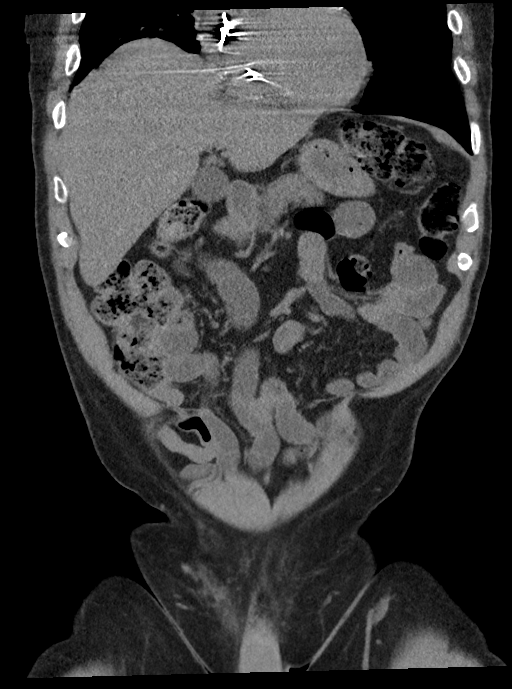
[im 46/103  soft-tissue]
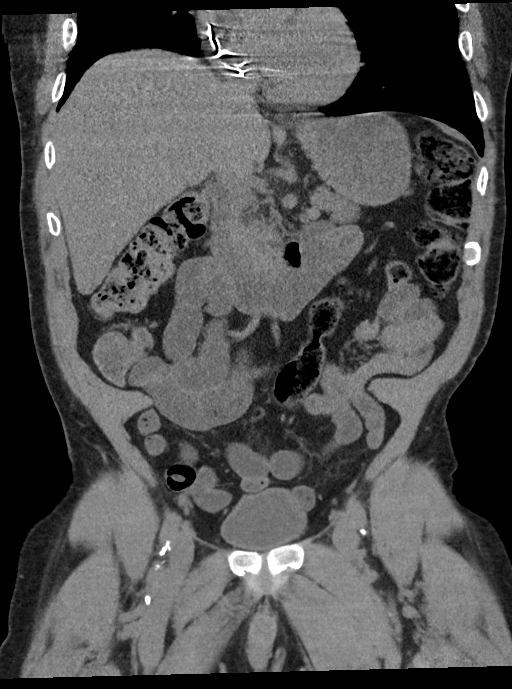
[im 57/103  soft-tissue]
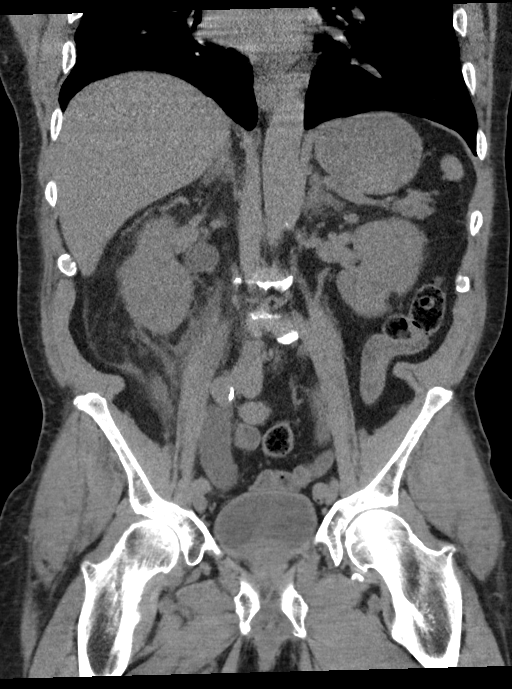

[15 of 46 positions shown; findings below may reference images not displayed]

FINDINGS: Lower chest: Normal heart size with pacemaker wires partially
included. No acute airspace disease or pleural effusion.

Hepatobiliary: Borderline hepatic steatosis. No evidence of focal
hepatic lesion on noncontrast exam. Small calcified gallstones
within physiologically distended gallbladder. No pericholecystic
inflammation. No common bile duct dilatation.

Pancreas: No ductal dilatation or inflammation.

Spleen: Normal in size without focal abnormality.

Adrenals/Urinary Tract: Mild left adrenal thickening. No dominant
nodule of either adrenal gland.

Obstructing 5 x 7 mm stone at the right ureterovesicular junction
with moderate hydronephrosis and prominent hydroureter. Moderate
right perinephric edema. Additional nonobstructing stones in the
lower right kidney, largest measures 6 mm.

There is no left hydronephrosis. Small nonobstructing stones in the
left kidney largest in the lower pole measuring 4 mm. There is no
left ureteral calculus. Urinary bladder is partially distended.

Stomach/Bowel: Small hiatal hernia. Small amount of fluid in the
distal esophagus. The stomach is physiologically distended. There
are small duodenal diverticulum without acute inflammation. Normal
positioning of the duodenum and ligament of Treitz. Diverticulum at
the junction of the duodenum and jejunum without surrounding fat
stranding. Fluid-filled small bowel that are nondilated or inflamed.
Diminutive normal appendix. Multifocal colonic diverticulosis.
Moderate volume of colonic stool. Sigmoid colon is tortuous and
courses into the upper abdomen. No diverticulitis or colonic
inflammation.

Vascular/Lymphatic: Aorto bi-iliac atherosclerosis and tortuosity.
No aortic aneurysm. There is no bulky abdominopelvic adenopathy.

Reproductive: Enlarged prostate gland spans 5.4 cm and causes mass
effect on the bladder base.

Other: Fat in both inguinal canals. Minimal free fluid in the right
pericolic gutter which is likely reactive. No free air. Tiny fat
containing umbilical hernia

Musculoskeletal: Multilevel degenerative change in the spine with
grade 1 anterolisthesis of L3 on L4 and L4 on L5, likely facet
mediated. Degenerative change of both hips. There are no acute or
suspicious osseous abnormalities.
IMPRESSION: 1. Obstructing 5 x 7 mm stone at the right ureterovesicular junction
with moderate hydronephrosis and prominent hydroureter. Moderate
right perinephric edema.
2. Additional nonobstructing stones in both kidneys.
3. Colonic diverticulosis without diverticulitis.
4. Cholelithiasis without gallbladder inflammation.
5. Enlarged prostate gland causing mass effect on the bladder base.

Aortic Atherosclerosis ([id number]).

## 2022-02-19 IMAGING — CR DG CHEST 1V
1 series · 1 of 1 positions shown · non-contrast
Comparison: Radiograph 04/20/2015. Included lung bases from
abdominal CT earlier today.

CLINICAL DATA: Chronic cough peer

EXAM:
CHEST  1 VIEW

[dg chest 1 view]
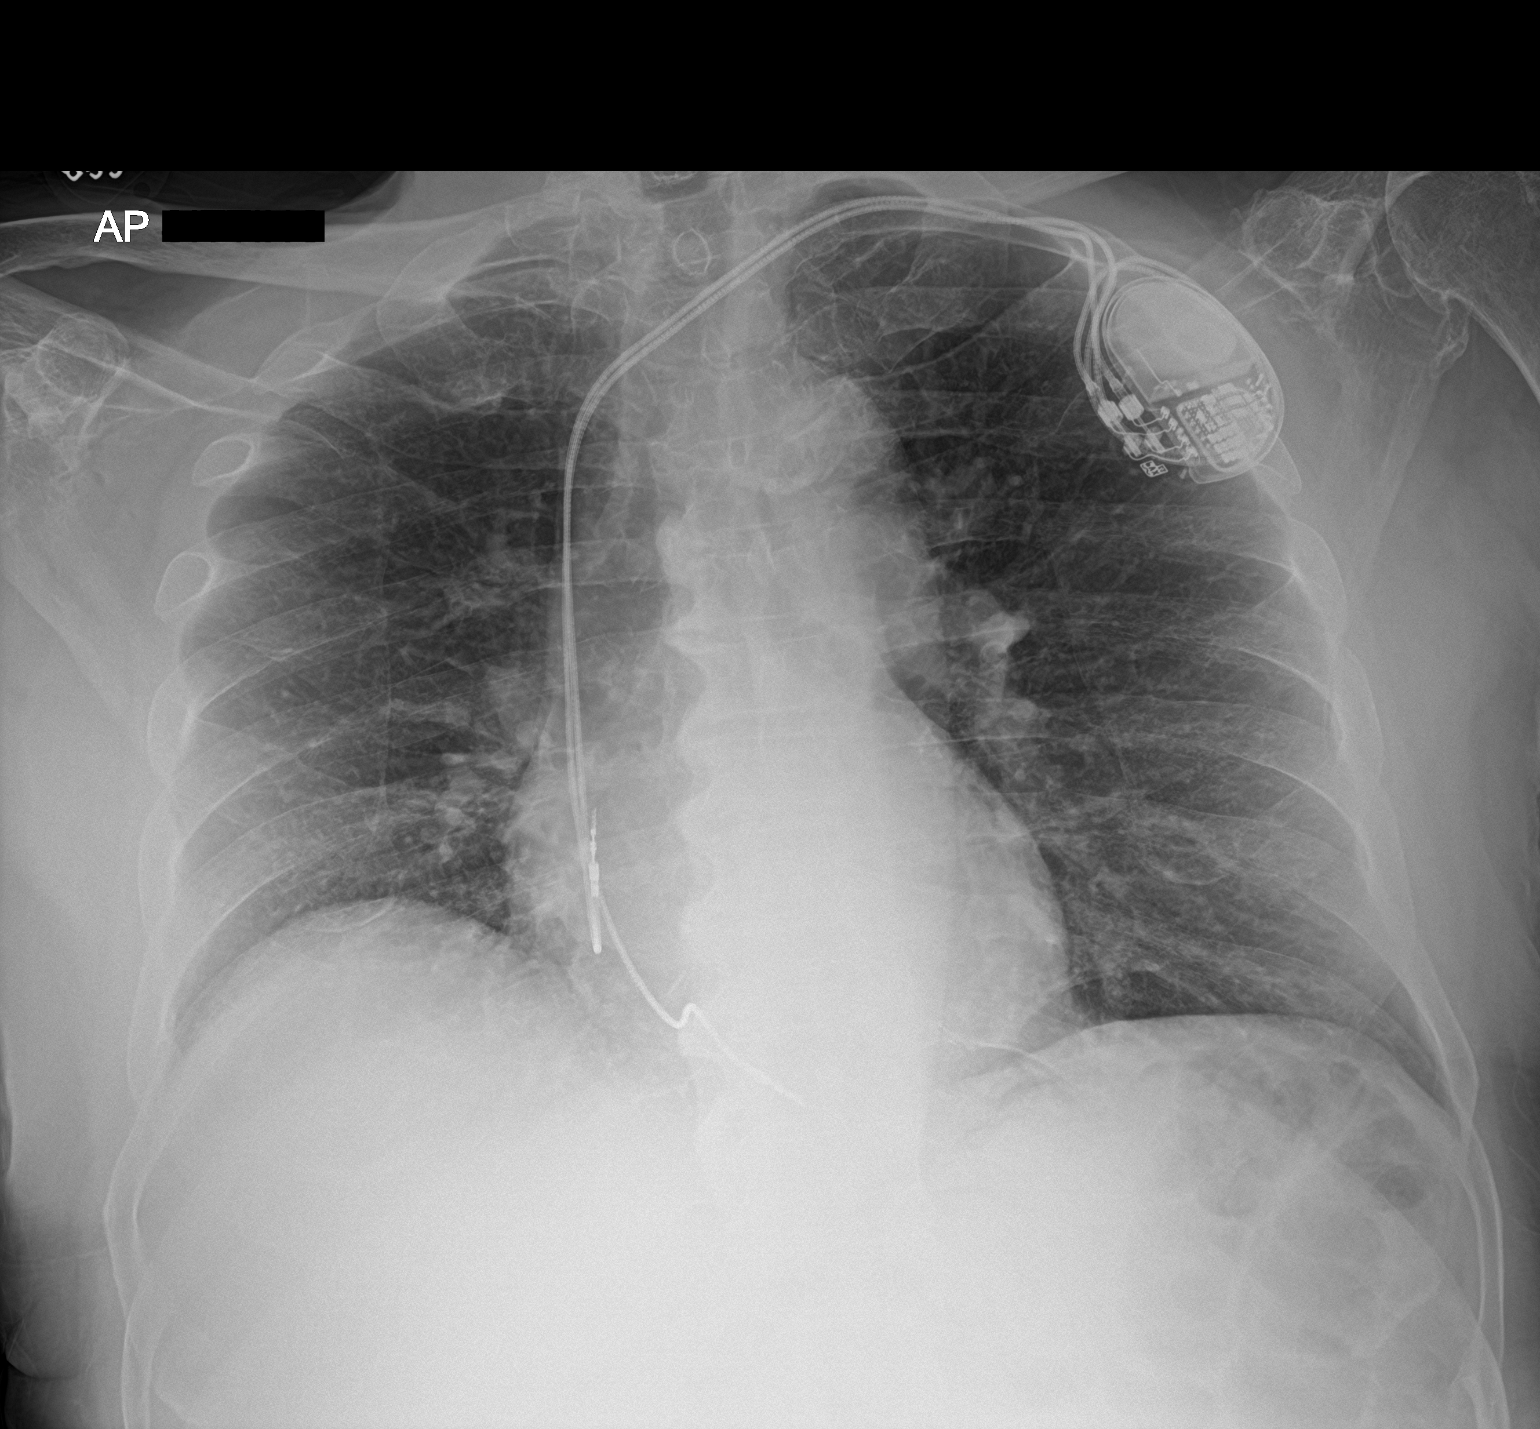

[1 of 1 positions shown; findings below may reference images not displayed]

FINDINGS: Dual lead left-sided pacemaker in place. Improved cardiomegaly from
8482. Unchanged mediastinal contours with aortic atherosclerosis.
Chronic interstitial coarsening. No confluent consolidation. No
pleural fluid or pneumothorax. No acute osseous abnormalities are
seen. Degenerative change in the left shoulder.
IMPRESSION: 1. No acute abnormality.
2. Chronic interstitial coarsening.

## 2022-03-04 ENCOUNTER — Other Ambulatory Visit: Payer: Self-pay | Admitting: Family Medicine

## 2022-03-04 DIAGNOSIS — E7849 Other hyperlipidemia: Secondary | ICD-10-CM

## 2022-03-20 ENCOUNTER — Ambulatory Visit (INDEPENDENT_AMBULATORY_CARE_PROVIDER_SITE_OTHER): Payer: Medicare HMO

## 2022-03-20 DIAGNOSIS — Z23 Encounter for immunization: Secondary | ICD-10-CM | POA: Diagnosis not present

## 2022-04-08 ENCOUNTER — Ambulatory Visit (INDEPENDENT_AMBULATORY_CARE_PROVIDER_SITE_OTHER): Payer: Medicare HMO | Admitting: Physician Assistant

## 2022-04-08 ENCOUNTER — Encounter: Payer: Self-pay | Admitting: Physician Assistant

## 2022-04-08 ENCOUNTER — Ambulatory Visit: Payer: Medicare HMO | Admitting: Family Medicine

## 2022-04-08 VITALS — BP 125/68 | HR 66 | Wt 173.7 lb

## 2022-04-08 DIAGNOSIS — E1140 Type 2 diabetes mellitus with diabetic neuropathy, unspecified: Secondary | ICD-10-CM

## 2022-04-08 LAB — POCT GLYCOSYLATED HEMOGLOBIN (HGB A1C): Hemoglobin A1C: 7.1 % — AB (ref 4.0–5.6)

## 2022-04-08 MED ORDER — ACCU-CHEK AVIVA PLUS VI STRP: ORAL_STRIP | 3 refills | Status: DC

## 2022-04-08 MED ORDER — ACCU-CHEK AVIVA PLUS W/DEVICE KIT: PACK | 0 refills | Status: AC

## 2022-04-08 MED ORDER — ACCU-CHEK MULTICLIX LANCETS MISC: 3 refills | Status: AC

## 2022-04-08 NOTE — Progress Notes (Signed)
I,Sha'taria Tyson,acting as a Education administrator for [employer], PA-C.,have documented all relevant documentation on the behalf of Mikey Kirschner, PA-C,as directed by  Mikey Kirschner, PA-C while in the presence of Mikey Kirschner, PA-C.   Established patient visit   Patient: Todd Ortega   DOB: [birth year]   87 y.o. Male  MRN: [id number] Visit Date: 04/08/2022  Today's healthcare provider: Mikey Kirschner, PA-C   Cc. DM f/u   Subjective    HPI    Diabetes Mellitus Type II, Follow-up  Lab Results  Component Value Date   HGBA1C 7.1 (A) 04/08/2022   HGBA1C 7.4 (H) 12/11/2021   HGBA1C 6.5 (A) 07/31/2021   Wt Readings from Last 3 Encounters:  04/08/22 173 lb 11.2 oz (78.8 kg)  12/11/21 170 lb 11.2 oz (77.4 kg)  11/08/21 172 lb (78 kg)   Last seen for diabetes 4 months ago.  Management since then includes continue current treatment. He reports excellent compliance with treatment. He is not having side effects. Symptoms: No fatigue No foot ulcerations  No appetite changes No nausea  No paresthesia of the feet  No polydipsia  No polyuria No visual disturbances   No vomiting     Home blood sugar records:  not being checked  Episodes of hypoglycemia? no   Current insulin regiment: none Most Recent Eye Exam: UTD Current exercise: none Current diet habits: well balanced  Pertinent Labs: Lab Results  Component Value Date   CHOL 143 12/11/2021   HDL 59 12/11/2021   LDLCALC 73 12/11/2021   TRIG 53 12/11/2021   CHOLHDL 2.4 12/11/2021   Lab Results  Component Value Date   NA 137 12/11/2021   K 4.5 12/11/2021   CREATININE 0.86 12/11/2021   EGFR 85 12/11/2021   MICROALBUR 100 09/10/2016   LABMICR 16.0 12/11/2021     ---------------------------------------------------------------------------------------------------   Medications: Outpatient Medications Prior to Visit  Medication Sig   acetaminophen (TYLENOL) 650 MG CR tablet Take 650 mg by mouth every 8 (eight) hours  as needed for pain.   ascorbic acid (VITAMIN C) 500 MG tablet Take 500 mg by mouth daily.   aspirin EC 81 MG tablet Take 81 mg by mouth at bedtime. Swallow whole.   Calcium Carb-Cholecalciferol (CALCIUM 500+D3 PO) Take 1 tablet by mouth daily.   finasteride (PROSCAR) 5 MG tablet Take 1 tablet (5 mg total) by mouth at bedtime.   gabapentin (NEURONTIN) 300 MG capsule Take 1 capsule (300 mg total) by mouth daily.   lisinopril (ZESTRIL) 10 MG tablet TAKE 1 TABLET AT BEDTIME   loratadine (CLARITIN) 10 MG tablet Take 1 tablet (10 mg total) by mouth daily.   rosuvastatin (CRESTOR) 40 MG tablet TAKE 1 TABLET EVERY DAY   tamsulosin (FLOMAX) 0.4 MG CAPS capsule Take 1 capsule (0.4 mg total) by mouth daily after breakfast.   [DISCONTINUED] glucose blood test strip BAYER CONTOUR TEST (In Vitro Strip)  1 (one) Strip Strip check sugars once daily, or as needed for 0 days  Quantity: 300;  Refills: 4   Ordered :31-Mar-2013  [name] ;  Started 31-Mar-2013 Active Comments: Medication taken as needed.   No facility-administered medications prior to visit.    Review of Systems  Constitutional:  Negative for fatigue and fever.  Respiratory:  Negative for cough and shortness of breath.   Cardiovascular:  Negative for chest pain, palpitations and leg swelling.  Neurological:  Negative for dizziness and headaches.      Objective    Blood pressure  125/68, pulse 66, weight 173 lb 11.2 oz (78.8 kg), SpO2 100 %.   Physical Exam Constitutional:      General: He is awake.     Appearance: He is well-developed.  HENT:     Head: Normocephalic.  Eyes:     Conjunctiva/sclera: Conjunctivae normal.  Cardiovascular:     Rate and Rhythm: Normal rate and regular rhythm.     Pulses:          Dorsalis pedis pulses are 2+ on the right side and 2+ on the left side.       Posterior tibial pulses are 2+ on the right side and 2+ on the left side.     Heart sounds: Normal heart sounds.  Pulmonary:     Effort:  Pulmonary effort is normal.     Breath sounds: Normal breath sounds.  Feet:     Right foot:     Protective Sensation: 4 sites tested.   1 site sensed.    Skin integrity: Skin integrity normal.     Toenail Condition: Right toenails are normal.     Left foot:     Protective Sensation: 4 sites tested.   1 site sensed.    Skin integrity: Skin integrity normal.     Toenail Condition: Left toenails are normal.     Comments: Pt only able to sense b/l plantar surface of great toes. Unable to sense top of foot  or other tested sites Skin:    General: Skin is warm.  Neurological:     Mental Status: He is alert and oriented to person, place, and time.  Psychiatric:        Attention and Perception: Attention normal.        Mood and Affect: Mood normal.        Speech: Speech normal.        Behavior: Behavior is cooperative.      Results for orders placed or performed in visit on 04/08/22  POCT HgB A1C  Result Value Ref Range   Hemoglobin A1C 7.1 (A) 4.0 - 5.6 %   HbA1c POC (<> result, manual entry)     HbA1c, POC (prediabetic range)     HbA1c, POC (controlled diabetic range)      Assessment & Plan     Problem List Items Addressed This Visit       Endocrine   Diabetes mellitus with diabetic neuropathy ([place]) - Primary    Unmedicated-- pt not interested in medications Previous A1c 7.4% , today 7.1%. advised goal is < 7% On statin, acei , LDL at goal < 70 Uacr utd, optho reminded, foot exam completed today Neuropathy managed w/ gabapentin, pt sees neurology and podiatry F/u 6 mo       Relevant Medications   Blood Glucose Monitoring Suppl (ACCU-CHEK AVIVA PLUS) w/Device KIT   glucose blood (ACCU-CHEK AVIVA PLUS) test strip   Lancets (ACCU-CHEK MULTICLIX) lancets   Other Relevant Orders   POCT HgB A1C (Completed)     Return in about 6 months (around 10/07/2022) for CPE.      I, Mikey Kirschner, PA-C have reviewed all documentation for this visit. The documentation on  04/08/2022  for the exam, diagnosis, procedures, and orders are all accurate and complete.  Mikey Kirschner, PA-C [facility] [address] [place], [place], [address] Office: [phone] Fax: [place]

## 2022-04-08 NOTE — Assessment & Plan Note (Deleted)
Unmedicated-- pt not interested in medications Previous A1c 7.4% , today 7.1%. advised goal is < 7% On statin, acei , LDL at goal < 70 Uacr utd, optho reminded, foot exam completed today Neuropathy managed w/ gabapentin, pt sees neurology and podiatry F/u 6 mo

## 2022-04-08 NOTE — Assessment & Plan Note (Signed)
Unmedicated-- pt not interested in medications Previous A1c 7.4% , today 7.1%. advised goal is < 7% On statin, acei , LDL at goal < 70 Uacr utd, optho reminded, foot exam completed today Neuropathy managed w/ gabapentin, pt sees neurology and podiatry F/u 6 mo

## 2022-04-15 ENCOUNTER — Ambulatory Visit (INDEPENDENT_AMBULATORY_CARE_PROVIDER_SITE_OTHER): Payer: Medicare HMO

## 2022-04-15 VITALS — Wt 173.0 lb

## 2022-04-15 DIAGNOSIS — Z0000 Encounter for general adult medical examination without abnormal findings: Secondary | ICD-10-CM | POA: Diagnosis not present

## 2022-04-15 NOTE — Patient Instructions (Signed)
[name] , Thank you for taking time to come for your Medicare Wellness Visit. I appreciate your ongoing commitment to your health goals. Please review the following plan we discussed and let me know if I can assist you in the future.   Screening recommendations/referrals: Colonoscopy: aged out Recommended yearly ophthalmology/optometry visit for glaucoma screening and checkup Recommended yearly dental visit for hygiene and checkup  Vaccinations: Influenza vaccine: 03/20/22 Pneumococcal vaccine: 03/07/14 Tdap vaccine: 02/23/11 Shingles vaccine: n/d   Covid-19: 06/15/19, 07/13/19, 03/30/20  Advanced directives: no  Conditions/risks identified: none  Next appointment: Follow up in one year for your annual wellness visit. 04/17/23 @ 9 am by phone  Preventive Care 65 Years and Older, Male Preventive care refers to lifestyle choices and visits with your health care provider that can promote health and wellness. What does preventive care include? A yearly physical exam. This is also called an annual well check. Dental exams once or twice a year. Routine eye exams. Ask your health care provider how often you should have your eyes checked. Personal lifestyle choices, including: Daily care of your teeth and gums. Regular physical activity. Eating a healthy diet. Avoiding tobacco and drug use. Limiting alcohol use. Practicing safe sex. Taking low doses of aspirin every day. Taking vitamin and mineral supplements as recommended by your health care provider. What happens during an annual well check? The services and screenings done by your health care provider during your annual well check will depend on your age, overall health, lifestyle risk factors, and family history of disease. Counseling  Your health care provider may ask you questions about your: Alcohol use. Tobacco use. Drug use. Emotional well-being. Home and relationship well-being. Sexual activity. Eating habits. History  of falls. Memory and ability to understand (cognition). Work and work Statistician. Screening  You may have the following tests or measurements: Height, weight, and BMI. Blood pressure. Lipid and cholesterol levels. These may be checked every 5 years, or more frequently if you are over 24 years old. Skin check. Lung cancer screening. You may have this screening every year starting at age 80 if you have a 30-pack-year history of smoking and currently smoke or have quit within the past 15 years. Fecal occult blood test (FOBT) of the stool. You may have this test every year starting at age 83. Flexible sigmoidoscopy or colonoscopy. You may have a sigmoidoscopy every 5 years or a colonoscopy every 10 years starting at age 63. Prostate cancer screening. Recommendations will vary depending on your family history and other risks. Hepatitis C blood test. Hepatitis B blood test. Sexually transmitted disease (STD) testing. Diabetes screening. This is done by checking your blood sugar (glucose) after you have not eaten for a while (fasting). You may have this done every 1-3 years. Abdominal aortic aneurysm (AAA) screening. You may need this if you are a current or former smoker. Osteoporosis. You may be screened starting at age 55 if you are at high risk. Talk with your health care provider about your test results, treatment options, and if necessary, the need for more tests. Vaccines  Your health care provider may recommend certain vaccines, such as: Influenza vaccine. This is recommended every year. Tetanus, diphtheria, and acellular pertussis (Tdap, Td) vaccine. You may need a Td booster every 10 years. Zoster vaccine. You may need this after age 26. Pneumococcal 13-valent conjugate (PCV13) vaccine. One dose is recommended after age 61. Pneumococcal polysaccharide (PPSV23) vaccine. One dose is recommended after age 52. Talk to your health  care provider about which screenings and vaccines you need  and how often you need them. This information is not intended to replace advice given to you by your health care provider. Make sure you discuss any questions you have with your health care provider. Document Released: 06/16/2015 Document Revised: 02/07/2016 Document Reviewed: 03/21/2015 Elsevier Interactive Patient Education  2017 [employer] Prevention in the Home Falls can cause injuries. They can happen to people of all ages. There are many things you can do to make your home safe and to help prevent falls. What can I do on the outside of my home? Regularly fix the edges of walkways and driveways and fix any cracks. Remove anything that might make you trip as you walk through a door, such as a raised step or threshold. Trim any bushes or trees on the path to your home. Use bright outdoor lighting. Clear any walking paths of anything that might make someone trip, such as rocks or tools. Regularly check to see if handrails are loose or broken. Make sure that both sides of any steps have handrails. Any raised decks and porches should have guardrails on the edges. Have any leaves, snow, or ice cleared regularly. Use sand or salt on walking paths during winter. Clean up any spills in your garage right away. This includes oil or grease spills. What can I do in the bathroom? Use night lights. Install grab bars by the toilet and in the tub and shower. Do not use towel bars as grab bars. Use non-skid mats or decals in the tub or shower. If you need to sit down in the shower, use a plastic, non-slip stool. Keep the floor dry. Clean up any water that spills on the floor as soon as it happens. Remove soap buildup in the tub or shower regularly. Attach bath mats securely with double-sided non-slip rug tape. Do not have throw rugs and other things on the floor that can make you trip. What can I do in the bedroom? Use night lights. Make sure that you have a light by your bed that is easy  to reach. Do not use any sheets or blankets that are too big for your bed. They should not hang down onto the floor. Have a firm chair that has side arms. You can use this for support while you get dressed. Do not have throw rugs and other things on the floor that can make you trip. What can I do in the kitchen? Clean up any spills right away. Avoid walking on wet floors. Keep items that you use a lot in easy-to-reach places. If you need to reach something above you, use a strong step stool that has a grab bar. Keep electrical cords out of the way. Do not use floor polish or wax that makes floors slippery. If you must use wax, use non-skid floor wax. Do not have throw rugs and other things on the floor that can make you trip. What can I do with my stairs? Do not leave any items on the stairs. Make sure that there are handrails on both sides of the stairs and use them. Fix handrails that are broken or loose. Make sure that handrails are as long as the stairways. Check any carpeting to make sure that it is firmly attached to the stairs. Fix any carpet that is loose or worn. Avoid having throw rugs at the top or bottom of the stairs. If you do have throw rugs, attach them to  the floor with carpet tape. Make sure that you have a light switch at the top of the stairs and the bottom of the stairs. If you do not have them, ask someone to add them for you. What else can I do to help prevent falls? Wear shoes that: Do not have high heels. Have rubber bottoms. Are comfortable and fit you well. Are closed at the toe. Do not wear sandals. If you use a stepladder: Make sure that it is fully opened. Do not climb a closed stepladder. Make sure that both sides of the stepladder are locked into place. Ask someone to hold it for you, if possible. Clearly mark and make sure that you can see: Any grab bars or handrails. First and last steps. Where the edge of each step is. Use tools that help you move  around (mobility aids) if they are needed. These include: Canes. Walkers. Scooters. Crutches. Turn on the lights when you go into a dark area. Replace any light bulbs as soon as they burn out. Set up your furniture so you have a clear path. Avoid moving your furniture around. If any of your floors are uneven, fix them. If there are any pets around you, be aware of where they are. Review your medicines with your doctor. Some medicines can make you feel dizzy. This can increase your chance of falling. Ask your doctor what other things that you can do to help prevent falls. This information is not intended to replace advice given to you by your health care provider. Make sure you discuss any questions you have with your health care provider. Document Released: 03/16/2009 Document Revised: 10/26/2015 Document Reviewed: 06/24/2014 Elsevier Interactive Patient Education  2017 [employer].

## 2022-04-15 NOTE — Progress Notes (Signed)
Virtual Visit via Telephone Note  I connected with  Todd Ortega on 04/15/22 at  9:45 AM EST by telephone and verified that I am speaking with the correct person using two identifiers.  Location: Patient: home Provider: [facility] Persons participating in the virtual visit: [place]   I discussed the limitations, risks, security and privacy concerns of performing an evaluation and management service by telephone and the availability of in person appointments. The patient expressed understanding and agreed to proceed.  Interactive audio and video telecommunications were attempted between this nurse and patient, however failed, due to patient having technical difficulties OR patient did not have access to video capability.  We continued and completed visit with audio only.  Some vital signs may be absent or patient reported.   Dionisio David, LPN  Subjective:   Todd Ortega is a 87 y.o. male who presents for Medicare Annual/Subsequent preventive examination.  Review of Systems     Cardiac Risk Factors include: advanced age (>46mn, >>50women);hypertension;male gender     Objective:    There were no vitals filed for this visit. There is no height or weight on file to calculate BMI.     04/15/2022    9:53 AM 06/12/2020   12:44 PM 05/13/2020    3:31 PM 02/24/2020    8:37 AM 02/15/2019    8:43 AM 02/12/2018    1:31 PM 04/14/2017    3:24 PM  Advanced Directives  Does Patient Have a Medical Advance Directive? No Yes No Yes No Yes Yes  Type of ASocial research officer, governmentLiving will  [place] will  [place] will Living will  Does patient want to make changes to medical advance directive?     No - Patient declined    Copy of [place] Chart?    No - copy requested  No - copy requested   Would patient like information on creating a medical advance directive? No - Patient declined   No - Patient declined        Current Medications (verified) Outpatient Encounter Medications as of 04/15/2022  Medication Sig   acetaminophen (TYLENOL) 650 MG CR tablet Take 650 mg by mouth every 8 (eight) hours as needed for pain.   ascorbic acid (VITAMIN C) 500 MG tablet Take 500 mg by mouth daily.   aspirin EC 81 MG tablet Take 81 mg by mouth at bedtime. Swallow whole.   Blood Glucose Monitoring Suppl (ACCU-CHEK AVIVA PLUS) w/Device KIT Use to check blood sugar once daily and as needed   Calcium Carb-Cholecalciferol (CALCIUM 500+D3 PO) Take 1 tablet by mouth daily.   finasteride (PROSCAR) 5 MG tablet Take 1 tablet (5 mg total) by mouth at bedtime.   gabapentin (NEURONTIN) 300 MG capsule Take 1 capsule (300 mg total) by mouth daily.   glucose blood (ACCU-CHEK AVIVA PLUS) test strip Use to check blood sugar once daily and as needed   Lancets (ACCU-CHEK MULTICLIX) lancets Use  to check blood sugar once daily and as needed   lisinopril (ZESTRIL) 10 MG tablet TAKE 1 TABLET AT BEDTIME   loratadine (CLARITIN) 10 MG tablet Take 1 tablet (10 mg total) by mouth daily.   rosuvastatin (CRESTOR) 40 MG tablet TAKE 1 TABLET EVERY DAY   tamsulosin (FLOMAX) 0.4 MG CAPS capsule Take 1 capsule (0.4 mg total) by mouth daily after breakfast.   No facility-administered encounter medications on file as of 04/15/2022.  Allergies (verified) Venlafaxine   History: Past Medical History:  Diagnosis Date   Arthritis    BPH (benign prostatic hypertrophy)    Chronic kidney disease    Complete heart block (HCC)    Complication of anesthesia    Hard to wake.   Coronary artery disease    Diabetes mellitus without complication (HCC)    Foot drop, right foot    Hyperlipidemia    Hypertension    Hyperthyroidism    Nephrolithiasis    Neuropathy    Shortness of breath dyspnea    Sleep apnea    Does not use CPAP   Past Surgical History:  Procedure Laterality Date   CARDIAC CATHETERIZATION      CYSTOSCOPY W/ RETROGRADES Right 06/16/2020   Procedure: CYSTOSCOPY WITH RETROGRADE PYELOGRAM;  Surgeon: Billey Co, MD;  Location: [facility];  Service: Urology;  Laterality: Right;   CYSTOSCOPY WITH STENT PLACEMENT Right 05/13/2020   Procedure: CYSTOSCOPY RETROGRADE LASER  WITH STENT PLACEMENT RIGHT;  Surgeon: Billey Co, MD;  Location: [facility];  Service: Urology;  Laterality: Right;   CYSTOSCOPY/URETEROSCOPY/HOLMIUM LASER/STENT PLACEMENT Right 06/16/2020   Procedure: CYSTOSCOPY/URETEROSCOPY/HOLMIUM LASER/STENT EXCHANGE;  Surgeon: Billey Co, MD;  Location: [facility];  Service: Urology;  Laterality: Right;   EYE SURGERY     HERNIA REPAIR Bilateral    Inguinal Hernia Repair   MASS EXCISION     removed from left hand   PACEMAKER INSERTION N/A 04/20/2015   Procedure: INSERTION PACEMAKER;  Surgeon: Isaias Cowman, MD;  Location: [facility];  Service: Cardiovascular;  Laterality: N/A;   Family History  Problem Relation Age of Onset   Heart attack Mother    Gout Mother    Diabetes Mother    Hypertension Mother    Hyperlipidemia Mother    Lung cancer Father    Drug abuse Brother    Social History   Socioeconomic History   Marital status: Married    Spouse name: Not on file   Number of children: 2   Years of education: Not on file   Highest education level: Some college, no degree  Occupational History   Occupation: retired   Occupation: Tax inspector    Comment: part time  Tobacco Use   Smoking status: Never    Passive exposure: Never   Smokeless tobacco: Never  Vaping Use   Vaping Use: Never used  Substance and Sexual Activity   Alcohol use: No    Alcohol/week: 0.0 standard drinks of alcohol   Drug use: No   Sexual activity: Yes    Birth control/protection: None  Other Topics Concern   Not on file  Social History Narrative   Not on file   Social Determinants of Health   Financial Resource Strain: [place]  (04/15/2022)   Overall Financial  Resource Strain (CARDIA)    Difficulty of Paying Living Expenses: Not very hard  Food Insecurity: No Food Insecurity (04/15/2022)   Hunger Vital Sign    Worried About Running Out of Food in the Last Year: Never true    New Freedom in the Last Year: Never true  Transportation Needs: No Transportation Needs (04/15/2022)   PRAPARE - Hydrologist (Medical): No    Lack of Transportation (Non-Medical): No  Physical Activity: Inactive (04/15/2022)   Exercise Vital Sign    Days of Exercise per Week: 0 days    Minutes of Exercise per Session: 0 min  Stress: No Stress Concern Present (  04/15/2022)   [employer]    Feeling of Stress : Not at all  Social Connections: [place] (04/15/2022)   Social Connection and Isolation Panel [NHANES]    Frequency of Communication with Friends and Family: More than three times a week    Frequency of Social Gatherings with Friends and Family: Twice a week    Attends Religious Services: More than 4 times per year    Active Member of [employer] or Organizations: Yes    Attends Music therapist: More than 4 times per year    Marital Status: Married    Tobacco Counseling Counseling given: Not Answered   Clinical Intake:  Pre-visit preparation completed: Yes  Pain : No/denies pain     Nutritional Risks: None Diabetes: No  How often do you need to have someone help you when you read instructions, pamphlets, or other written materials from your doctor or pharmacy?: 1 - Never  Diabetic?no  Interpreter Needed?: No  Information entered by :: Kirke Shaggy, LPN   Activities of Daily Living    04/15/2022    9:55 AM 12/11/2021   11:02 AM  In your present state of health, do you have any difficulty performing the following activities:  Hearing? 0 0  Vision? 0 0  Difficulty concentrating or making decisions? 0 0  Walking or climbing  stairs? 1 1  Dressing or bathing? 0 0  Doing errands, shopping? 0 0  Preparing Food and eating ? N   Using the Toilet? N   In the past six months, have you accidently leaked urine? N   Do you have problems with loss of bowel control? N   Managing your Medications? N   Managing your Finances? N   Housekeeping or managing your Housekeeping? N     Patient Care Team: [name], MD as PCP - General (Family Medicine) [name], MD as Consulting Physician (Cardiology) [name], MD as Consulting Physician (Neurology) Gayland Curry Hubbard Hartshorn (Neurology)  Indicate any recent Medical Services you may have received from other than [facility] providers in the past year (date may be approximate).     Assessment:   This is a routine wellness examination for Todd Ortega.  Hearing/Vision screen Hearing Screening - Comments:: No aids Vision Screening - Comments:: No glasses- Dr.Woodard  Dietary issues and exercise activities discussed: Current Exercise Habits: The patient does not participate in regular exercise at present   Goals Addressed             This Visit's Progress    DIET - EAT MORE FRUITS AND VEGETABLES         Depression Screen    04/15/2022    9:51 AM 12/11/2021   11:02 AM 07/31/2021   11:01 AM 02/28/2021   10:50 AM 08/23/2020    9:54 AM 02/24/2020    8:34 AM 02/15/2019    8:45 AM  PHQ 2/9 Scores  PHQ - 2 Score 0 0 0 0 0 0 0  PHQ- 9 Score 0  0 0 1      Fall Risk    04/15/2022    9:54 AM 12/11/2021   11:02 AM 07/31/2021   11:00 AM 02/28/2021   10:49 AM 08/23/2020    9:54 AM  Fall Risk   Falls in the past year? 0 0 0 0 0  Number falls in past yr: 0 0  0 0  Injury with Fall? 0 0  0  0  Risk for fall due to : No Fall Risks   Impaired balance/gait;Impaired mobility   Follow up Falls prevention discussed;Falls evaluation completed   Falls evaluation completed Falls evaluation completed    FALL RISK PREVENTION PERTAINING TO THE HOME:  Any stairs in  or around the home? Yes  If so, are there any without handrails? No  Home free of loose throw rugs in walkways, pet beds, electrical cords, etc? Yes  Adequate lighting in your home to reduce risk of falls? Yes   ASSISTIVE DEVICES UTILIZED TO PREVENT FALLS:  Life alert? No  Use of a cane, walker or w/c? Yes  Grab bars in the bathroom? Yes  Shower chair or bench in shower? Yes  Elevated toilet seat or a handicapped toilet? Yes    Cognitive Function:        04/15/2022    9:55 AM 04/12/2016    3:22 PM  6CIT Screen  What Year? 0 points 0 points  What month? 0 points 0 points  What time? 0 points 0 points  Count back from 20 0 points 0 points  Months in reverse 0 points 0 points  Repeat phrase 0 points 2 points  Total Score 0 points 2 points    Immunizations Immunization History  Administered Date(s) Administered   Fluad Quad(high Dose 65+) 02/16/2019, 02/24/2020, 02/28/2021, 03/20/2022   Influenza, High Dose Seasonal PF 03/31/2016, 02/21/2017, 02/12/2018   Influenza,inj,Quad PF,6+ Mos 04/21/2015   Influenza-Unspecified 04/21/2015   Moderna Sars-Covid-2 Vaccination 06/15/2019, 07/13/2019, 03/30/2020   Pneumococcal Conjugate-13 03/07/2014   Pneumococcal Polysaccharide-23 12/04/2011   Tdap 02/23/2011    TDAP status: Due, Education has been provided regarding the importance of this vaccine. Advised may receive this vaccine at local pharmacy or Health Dept. Aware to provide a copy of the vaccination record if obtained from local pharmacy or Health Dept. Verbalized acceptance and understanding.  Flu Vaccine status: Up to date  Pneumococcal vaccine status: Up to date  Covid-19 vaccine status: Completed vaccines  Qualifies for Shingles Vaccine? No   Zostavax completed No   Shingrix Completed?: No.    Education has been provided regarding the importance of this vaccine. Patient has been advised to call insurance company to determine out of pocket expense if they have not yet  received this vaccine. Advised may also receive vaccine at local pharmacy or Health Dept. Verbalized acceptance and understanding.  Screening Tests Health Maintenance  Topic Date Due   Zoster Vaccines- Shingrix (1 of 2) Never done   COVID-19 Vaccine (4 - Moderna series) 05/25/2020   TETANUS/TDAP  02/22/2021   OPHTHALMOLOGY EXAM  04/10/2021   HEMOGLOBIN A1C  10/07/2022   Diabetic kidney evaluation - GFR measurement  12/12/2022   Diabetic kidney evaluation - Urine ACR  12/12/2022   FOOT EXAM  04/09/2023   Medicare Annual Wellness (AWV)  04/16/2023   Pneumonia Vaccine 81+ Years old  Completed   INFLUENZA VACCINE  Completed   HPV VACCINES  Aged Out    Health Maintenance  Health Maintenance Due  Topic Date Due   Zoster Vaccines- Shingrix (1 of 2) Never done   COVID-19 Vaccine (4 - Moderna series) 05/25/2020   TETANUS/TDAP  02/22/2021   OPHTHALMOLOGY EXAM  04/10/2021    Colorectal cancer screening: No longer required.   Lung Cancer Screening: (Low Dose CT Chest recommended if Age 58-80 years, 30 pack-year currently smoking OR have quit w/in 15years.) does not qualify.   Additional Screening:  Hepatitis C Screening: does not qualify;  Completed no  Vision Screening: Recommended annual ophthalmology exams for early detection of glaucoma and other disorders of the eye. Is the patient up to date with their annual eye exam?  Yes  Who is the provider or what is the name of the office in which the patient attends annual eye exams? Dr.Woodard If pt is not established with a provider, would they like to be referred to a provider to establish care? No .   Dental Screening: Recommended annual dental exams for proper oral hygiene  Community Resource Referral / Chronic Care Management: CRR required this visit?  No   CCM required this visit?  No      Plan:     I have personally reviewed and noted the following in the patient's chart:   Medical and social history Use of alcohol,  tobacco or illicit drugs  Current medications and supplements including opioid prescriptions. Patient is not currently taking opioid prescriptions. Functional ability and status Nutritional status Physical activity Advanced directives List of other physicians Hospitalizations, surgeries, and ER visits in previous 12 months Vitals Screenings to include cognitive, depression, and falls Referrals and appointments  In addition, I have reviewed and discussed with patient certain preventive protocols, quality metrics, and best practice recommendations. A written personalized care plan for preventive services as well as general preventive health recommendations were provided to patient.     Dionisio David, LPN   [id number]   Nurse Notes: none

## 2022-05-21 DIAGNOSIS — I495 Sick sinus syndrome: Secondary | ICD-10-CM | POA: Diagnosis not present

## 2022-05-22 ENCOUNTER — Telehealth: Payer: Self-pay | Admitting: Family Medicine

## 2022-05-22 MED ORDER — LISINOPRIL 10 MG PO TABS: 10.0000 mg | ORAL_TABLET | Freq: Every day | ORAL | 0 refills | Status: DC

## 2022-05-22 NOTE — Telephone Encounter (Signed)
[facility] pharmacy faxed refill request for the following medications:    lisinopril (ZESTRIL) 10 MG tablet   Please advise

## 2022-06-15 ENCOUNTER — Encounter (HOSPITAL_COMMUNITY): Payer: Self-pay

## 2022-06-15 ENCOUNTER — Emergency Department: Payer: Medicare HMO

## 2022-06-15 ENCOUNTER — Other Ambulatory Visit: Payer: Self-pay

## 2022-06-15 ENCOUNTER — Emergency Department
Admission: EM | Admit: 2022-06-15 | Discharge: 2022-06-15 | Disposition: A | Discharge status: Home / Self Care | Payer: Medicare HMO | Attending: Emergency Medicine | Admitting: Emergency Medicine

## 2022-06-15 DIAGNOSIS — S2220XA Unspecified fracture of sternum, initial encounter for closed fracture: Secondary | ICD-10-CM | POA: Diagnosis not present

## 2022-06-15 DIAGNOSIS — J9811 Atelectasis: Secondary | ICD-10-CM | POA: Diagnosis not present

## 2022-06-15 DIAGNOSIS — M4319 Spondylolisthesis, multiple sites in spine: Secondary | ICD-10-CM | POA: Diagnosis not present

## 2022-06-15 DIAGNOSIS — R079 Chest pain, unspecified: Secondary | ICD-10-CM | POA: Diagnosis not present

## 2022-06-15 DIAGNOSIS — S0990XA Unspecified injury of head, initial encounter: Secondary | ICD-10-CM | POA: Insufficient documentation

## 2022-06-15 DIAGNOSIS — S3991XA Unspecified injury of abdomen, initial encounter: Secondary | ICD-10-CM | POA: Diagnosis not present

## 2022-06-15 DIAGNOSIS — S2222XA Fracture of body of sternum, initial encounter for closed fracture: Secondary | ICD-10-CM | POA: Diagnosis not present

## 2022-06-15 DIAGNOSIS — S199XXA Unspecified injury of neck, initial encounter: Secondary | ICD-10-CM | POA: Diagnosis not present

## 2022-06-15 DIAGNOSIS — N200 Calculus of kidney: Secondary | ICD-10-CM | POA: Diagnosis not present

## 2022-06-15 DIAGNOSIS — K828 Other specified diseases of gallbladder: Secondary | ICD-10-CM | POA: Diagnosis not present

## 2022-06-15 DIAGNOSIS — M47814 Spondylosis without myelopathy or radiculopathy, thoracic region: Secondary | ICD-10-CM | POA: Diagnosis not present

## 2022-06-15 DIAGNOSIS — K8020 Calculus of gallbladder without cholecystitis without obstruction: Secondary | ICD-10-CM | POA: Insufficient documentation

## 2022-06-15 DIAGNOSIS — I700 Atherosclerosis of aorta: Secondary | ICD-10-CM | POA: Diagnosis not present

## 2022-06-15 DIAGNOSIS — Y92410 Unspecified street and highway as the place of occurrence of the external cause: Secondary | ICD-10-CM | POA: Insufficient documentation

## 2022-06-15 DIAGNOSIS — R0789 Other chest pain: Secondary | ICD-10-CM | POA: Diagnosis not present

## 2022-06-15 DIAGNOSIS — S20304A Unspecified superficial injuries of middle front wall of thorax, initial encounter: Secondary | ICD-10-CM | POA: Diagnosis present

## 2022-06-15 LAB — TROPONIN I (HIGH SENSITIVITY)
Troponin I (High Sensitivity): 29 ng/L — ABNORMAL HIGH (ref <18)
Troponin I (High Sensitivity): 26 ng/L — ABNORMAL HIGH (ref <18)

## 2022-06-15 LAB — BASIC METABOLIC PANEL
Anion gap: 7 (ref 5–15)
BUN: 20 mg/dL (ref 8–23)
CO2: 24 mmol/L (ref 22–32)
Calcium: 9.1 mg/dL (ref 8.9–10.3)
Chloride: 105 mmol/L (ref 98–111)
Creatinine, Ser: 0.73 mg/dL (ref 0.61–1.24)
GFR, Estimated: >60 mL/min (ref >60)
Glucose, Bld: 118 mg/dL — ABNORMAL HIGH (ref 70–99)
Potassium: 3.8 mmol/L (ref 3.5–5.1)
Sodium: 136 mmol/L (ref 135–145)

## 2022-06-15 LAB — CBC
HCT: 44.5 % (ref 39.0–52.0)
Hemoglobin: 14.5 g/dL (ref 13.0–17.0)
MCH: 31.8 pg (ref 26.0–34.0)
MCHC: 32.6 g/dL (ref 30.0–36.0)
MCV: 97.6 fL (ref 80.0–100.0)
Platelets: 149 10*3/uL — ABNORMAL LOW (ref 150–400)
RBC: 4.56 MIL/uL (ref 4.22–5.81)
RDW: 14.2 % (ref 11.5–15.5)
WBC: 8.3 10*3/uL (ref 4.0–10.5)
nRBC: 0 % (ref 0.0–0.2)

## 2022-06-15 LAB — HEPATIC FUNCTION PANEL
ALT: 40 U/L (ref 0–44)
AST: 40 U/L (ref 15–41)
Albumin: 3.9 g/dL (ref 3.5–5.0)
Alkaline Phosphatase: 84 U/L (ref 38–126)
Bilirubin, Direct: 0.1 mg/dL (ref 0.0–0.2)
Indirect Bilirubin: 0.8 mg/dL (ref 0.3–0.9)
Total Bilirubin: 0.9 mg/dL (ref 0.3–1.2)
Total Protein: 7.7 g/dL (ref 6.5–8.1)

## 2022-06-15 LAB — LIPASE, BLOOD: Lipase: 66 U/L — ABNORMAL HIGH (ref 11–51)

## 2022-06-15 MED ORDER — LIDOCAINE 5 % EX PTCH
1.0000 | MEDICATED_PATCH | Freq: Once | CUTANEOUS | Status: DC
  Administered 2022-06-15: 1 via TRANSDERMAL
  Filled 2022-06-15: qty 1

## 2022-06-15 MED ORDER — OXYCODONE HCL 5 MG PO TABS: 5.0000 mg | ORAL_TABLET | Freq: Four times a day (QID) | ORAL | 0 refills | Status: DC | PRN

## 2022-06-15 MED ORDER — IOHEXOL 300 MG/ML  SOLN
100.0000 mL | Freq: Once | INTRAMUSCULAR | Status: AC | PRN
  Administered 2022-06-15: 100 mL via INTRAVENOUS

## 2022-06-15 MED ORDER — LIDOCAINE 5 % EX PTCH: 1.0000 | MEDICATED_PATCH | Freq: Two times a day (BID) | CUTANEOUS | 0 refills | Status: AC

## 2022-06-15 MED ORDER — OXYCODONE HCL 5 MG PO TABS
2.5000 mg | ORAL_TABLET | Freq: Once | ORAL | Status: AC
  Administered 2022-06-15: 2.5 mg via ORAL
  Filled 2022-06-15: qty 1

## 2022-06-15 NOTE — ED Notes (Signed)
Called to [facility] for [place] tramua per MD funke/rep:kimberly
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
## 2022-06-15 NOTE — ED Notes (Signed)
[employer] interrogation completed and transmitted at 9:41pm

## 2022-06-15 NOTE — ED Notes (Signed)
Pt instructed on use of incentive spirometer, showed proper use of device.
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
## 2022-06-15 NOTE — ED Notes (Signed)
Patient verbalizes understanding of discharge instructions. Opportunity for questioning and answers were provided. Armband removed by staff, pt discharged from ED. Wheeled out to lobby with son
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
## 2022-06-19 DIAGNOSIS — I2510 Atherosclerotic heart disease of native coronary artery without angina pectoris: Secondary | ICD-10-CM | POA: Diagnosis not present

## 2022-06-19 DIAGNOSIS — I495 Sick sinus syndrome: Secondary | ICD-10-CM | POA: Diagnosis not present

## 2022-06-19 DIAGNOSIS — I10 Essential (primary) hypertension: Secondary | ICD-10-CM | POA: Diagnosis not present

## 2022-06-19 DIAGNOSIS — R0609 Other forms of dyspnea: Secondary | ICD-10-CM | POA: Diagnosis not present

## 2022-06-19 DIAGNOSIS — Z950 Presence of cardiac pacemaker: Secondary | ICD-10-CM | POA: Diagnosis not present
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
## 2022-06-25 ENCOUNTER — Ambulatory Visit (INDEPENDENT_AMBULATORY_CARE_PROVIDER_SITE_OTHER): Payer: Medicare HMO | Admitting: Family Medicine

## 2022-06-25 ENCOUNTER — Encounter: Payer: Self-pay | Admitting: Family Medicine

## 2022-06-25 VITALS — BP 132/72 | HR 79 | Temp 98.0°F | Resp 16 | Wt 175.0 lb

## 2022-06-25 DIAGNOSIS — S2220XA Unspecified fracture of sternum, initial encounter for closed fracture: Secondary | ICD-10-CM | POA: Diagnosis not present

## 2022-06-25 NOTE — Assessment & Plan Note (Signed)
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

## 2022-06-25 NOTE — Patient Instructions (Signed)
Please continue to rest and slowly start doing activities around the home that mimic what you would do for work.   I recommend considering return to work after 07/15/22. Please schedule follow up with me before returning to work.   I recommend limiting lifting objects overhead that weigh more than 2 lbs for now.   You can continue to take over the counter medications for your pain.   Please be evaluated emergently if you experience difficulty breathing or acutely worsening pain in your chest.

## 2022-07-15 NOTE — Progress Notes (Unsigned)
I,[name],acting as a scribe for [employer], MD.,have documented all relevant documentation on the behalf of Todd Foster, MD,as directed by  Todd Foster, MD while in the presence of Todd Foster, MD.   Established patient visit   Patient: Todd Ortega   DOB: March 03, 1935   88 y.o. Male  MRN: [id number] Visit Date: 07/16/2022  Today's healthcare provider: [name], MD   Chief Complaint  Patient presents with   Follow-up   Subjective    HPI  Follow up for sternum fracture  The patient was last seen for this 3 weeks ago. Changes made at last visit include Recommended using the previously prescribed oxycodone if pain becomes moderate or interferes with day-to-day functionality and avoid movement that exarcebate pain.  Today patient reports chest soreness is better just a little tender with certain movement. Patient didn't take the Oxycodone. He is taking IBU.  -----------------------------------------------------------------------------------------   Medications: Outpatient Medications Prior to Visit  Medication Sig   acetaminophen (TYLENOL) 650 MG CR tablet Take 650 mg by mouth every 8 (eight) hours as needed for pain.   ascorbic acid (VITAMIN C) 500 MG tablet Take 500 mg by mouth daily.   aspirin EC 81 MG tablet Take 81 mg by mouth at bedtime. Swallow whole.   Blood Glucose Monitoring Suppl (ACCU-CHEK AVIVA PLUS) w/Device KIT Use to check blood sugar once daily and as needed   Calcium Carb-Cholecalciferol (CALCIUM 500+D3 PO) Take 1 tablet by mouth daily.   finasteride (PROSCAR) 5 MG tablet Take 1 tablet (5 mg total) by mouth at bedtime.   gabapentin (NEURONTIN) 300 MG capsule Take 1 capsule (300 mg total) by mouth daily.   glucose blood (ACCU-CHEK AVIVA PLUS) test strip Use to check blood sugar once daily and as needed   Lancets (ACCU-CHEK MULTICLIX) lancets Use  to check blood sugar once daily and as  needed   lisinopril (ZESTRIL) 10 MG tablet Take 1 tablet (10 mg total) by mouth at bedtime.   loratadine (CLARITIN) 10 MG tablet Take 1 tablet (10 mg total) by mouth daily.   rosuvastatin (CRESTOR) 40 MG tablet TAKE 1 TABLET EVERY DAY   tamsulosin (FLOMAX) 0.4 MG CAPS capsule Take 1 capsule (0.4 mg total) by mouth daily after breakfast.   oxyCODONE (ROXICODONE) 5 MG immediate release tablet Take 1 tablet (5 mg total) by mouth every 6 (six) hours as needed. (Patient not taking: Reported on 07/16/2022)   No facility-administered medications prior to visit.    Review of Systems  {Labs  Heme  Chem  Endocrine  Serology  Results Review (optional):23779}   Objective    BP 126/69 (BP Location: Right Arm, Patient Position: Sitting, Cuff Size: Normal)   Pulse 71   Resp 16   Wt 178 lb 8 oz (81 kg)   SpO2 97%   BMI 27.96 kg/m  {Show previous vital signs (optional):23777}  Physical Exam  ***  No results found for any visits on 07/16/22.  Assessment & Plan     Problem List Items Addressed This Visit       Musculoskeletal and Integument   Closed fracture dislocation of sternum - Primary     No follow-ups on file.        The entirety of the information documented in the History of Present Illness, Review of Systems and Physical Exam were personally obtained by me. Portions of this information were initially documented by *** and reviewed by me for thoroughness and accuracy.[place], CMA  Todd Foster, MD  [facility] [phone] (phone) [phone] (fax)  [place]

## 2022-07-16 ENCOUNTER — Encounter: Payer: Self-pay | Admitting: Family Medicine

## 2022-07-16 ENCOUNTER — Ambulatory Visit (INDEPENDENT_AMBULATORY_CARE_PROVIDER_SITE_OTHER): Payer: Medicare HMO | Admitting: Family Medicine

## 2022-07-16 VITALS — BP 126/69 | HR 71 | Resp 16 | Wt 178.5 lb

## 2022-07-16 DIAGNOSIS — S2220XA Unspecified fracture of sternum, initial encounter for closed fracture: Secondary | ICD-10-CM

## 2022-07-18 NOTE — Assessment & Plan Note (Signed)
Improved  Stable  No new symptoms associated with injury  Patient's pain is controlled and he has normal respirations  Given improved symptoms, do not recommend additional imaging as fracture was not visualized on prior chest XR  Patient given note to return to work on 07/22/22 Follow up if needed

## 2022-09-19 ENCOUNTER — Other Ambulatory Visit: Payer: Self-pay | Admitting: Family Medicine

## 2022-09-19 DIAGNOSIS — E7849 Other hyperlipidemia: Secondary | ICD-10-CM

## 2022-10-07 ENCOUNTER — Ambulatory Visit
Admission: RE | Admit: 2022-10-07 | Discharge: 2022-10-07 | Disposition: A | Discharge status: Home / Self Care | Payer: Medicare HMO | Source: Ambulatory Visit | Attending: Family Medicine | Admitting: Family Medicine

## 2022-10-07 ENCOUNTER — Ambulatory Visit
Admission: RE | Admit: 2022-10-07 | Discharge: 2022-10-07 | Disposition: A | Discharge status: Home / Self Care | Payer: Medicare HMO | Attending: Family Medicine | Admitting: Family Medicine

## 2022-10-07 ENCOUNTER — Ambulatory Visit (INDEPENDENT_AMBULATORY_CARE_PROVIDER_SITE_OTHER): Payer: Medicare HMO | Admitting: Family Medicine

## 2022-10-07 ENCOUNTER — Encounter: Payer: Self-pay | Admitting: Family Medicine

## 2022-10-07 VITALS — BP 133/84 | HR 83 | Temp 98.4°F | Wt 168.0 lb

## 2022-10-07 DIAGNOSIS — Z7984 Long term (current) use of oral hypoglycemic drugs: Secondary | ICD-10-CM | POA: Diagnosis not present

## 2022-10-07 DIAGNOSIS — M79604 Pain in right leg: Secondary | ICD-10-CM | POA: Diagnosis not present

## 2022-10-07 DIAGNOSIS — E0590 Thyrotoxicosis, unspecified without thyrotoxic crisis or storm: Secondary | ICD-10-CM | POA: Diagnosis not present

## 2022-10-07 DIAGNOSIS — E1140 Type 2 diabetes mellitus with diabetic neuropathy, unspecified: Secondary | ICD-10-CM | POA: Diagnosis not present

## 2022-10-07 DIAGNOSIS — M79661 Pain in right lower leg: Secondary | ICD-10-CM | POA: Diagnosis not present

## 2022-10-07 DIAGNOSIS — G4739 Other sleep apnea: Secondary | ICD-10-CM

## 2022-10-07 DIAGNOSIS — I10 Essential (primary) hypertension: Secondary | ICD-10-CM

## 2022-10-07 MED ORDER — DULOXETINE HCL 30 MG PO CPEP: 30.0000 mg | ORAL_CAPSULE | Freq: Every day | ORAL | 1 refills | Status: DC

## 2022-10-07 NOTE — Progress Notes (Unsigned)
Established patient visit   Patient: Todd Ortega   DOB: 1935/03/24   88 y.o. Male  MRN: [id number] Visit Date: 10/07/2022  Today's healthcare provider: Ronnald Ramp, MD   No chief complaint on file.  Subjective    HPI  Hypertension, follow-up  BP Readings from Last 3 Encounters:  10/07/22 133/84  07/16/22 126/69  06/25/22 132/72   Wt Readings from Last 3 Encounters:  10/07/22 168 lb (76.2 kg)  07/16/22 178 lb 8 oz (81 kg)  06/25/22 175 lb (79.4 kg)     He was last seen for hypertension 6 months ago.  BP at that visit was 125/68. Management since that visit includes none  He reports good compliance with treatment. He is not having side effects.  He is following a Regular diet. He is not exercising. He does not smoke.  Use of agents associated with hypertension: none.   Outside blood pressures are running about 130's over 70's. Symptoms: No chest pain No chest pressure  No palpitations No syncope  No dyspnea No orthopnea  No paroxysmal nocturnal dyspnea No lower extremity edema   Pertinent labs Lab Results  Component Value Date   CHOL 143 12/11/2021   HDL 59 12/11/2021   LDLCALC 73 12/11/2021   TRIG 53 12/11/2021   CHOLHDL 2.4 12/11/2021   Lab Results  Component Value Date   NA 139 10/07/2022   K 4.6 10/07/2022   CREATININE 0.91 10/07/2022   GFRNONAA >60 06/15/2022   GLUCOSE 183 (H) 10/07/2022   TSH 0.430 (L) 10/07/2022     The ASCVD Risk score (Arnett DK, et al., 2019) failed to calculate for the following reasons:   The 2019 ASCVD risk score is only valid for ages 64 to 33  --------------------------------------------------------------------------------------------------- Diabetes Mellitus Type II, Follow-up  Lab Results  Component Value Date   HGBA1C 7.1 (H) 10/07/2022   HGBA1C 7.1 (A) 04/08/2022   HGBA1C 7.4 (H) 12/11/2021   Wt Readings from Last 3 Encounters:  10/07/22 168 lb (76.2 kg)  07/16/22 178 lb 8 oz (81 kg)   06/25/22 175 lb (79.4 kg)   Last seen for diabetes 6 months ago.  Management since then includes none. He reports good compliance with treatment. He is not having side effects.  Symptoms: No fatigue No foot ulcerations  No appetite changes No nausea  Yes paresthesia of the feet  No polydipsia  No polyuria No visual disturbances   No vomiting     Home blood sugar records:  110-160  Episodes of hypoglycemia? No    Current insulin regiment: none  Most Recent Eye Exam: Patient is due  Concern for Balance Leg Pain  Patient's wife states that his balance has worsened and that the patient has a hard time standing up straight because he feels more balanced while bending sightly at his knees  They state that he also has some knee pain on the right and ankle pain  He reports having some trouble with sleeping  He has pain in the right lower extremity shin bone at night sometimes  Patient does not want to be on chronic narcotics if this is avoidable    Pertinent Labs: Lab Results  Component Value Date   CHOL 143 12/11/2021   HDL 59 12/11/2021   LDLCALC 73 12/11/2021   TRIG 53 12/11/2021   CHOLHDL 2.4 12/11/2021   Lab Results  Component Value Date   NA 139 10/07/2022   K 4.6 10/07/2022   CREATININE 0.91  10/07/2022   GFRNONAA >60 06/15/2022   LABMICR 16.0 12/11/2021   MICRALBCREAT 13 12/11/2021     --------------------------------------------------------------------------------------------------- Chronic Sleep Apnea-Patient has not used his CPAP machine in years.   Once he retired, he states he did not need it anymore Patient presents with hx of obstructive sleep apnea.    Hyperthyroidism  Patient presents with h/o hyperthyroidism.  Symptoms have not been present. Patient does not currently take medication for hyperthyroidism.   Lab Results  Component Value Date   TSH 0.430 (L) 10/07/2022     Medications: Outpatient Medications Prior to Visit  Medication Sig    acetaminophen (TYLENOL) 650 MG CR tablet Take 650 mg by mouth every 8 (eight) hours as needed for pain.   ascorbic acid (VITAMIN C) 500 MG tablet Take 500 mg by mouth daily.   aspirin EC 81 MG tablet Take 81 mg by mouth at bedtime. Swallow whole.   Blood Glucose Monitoring Suppl (ACCU-CHEK AVIVA PLUS) w/Device KIT Use to check blood sugar once daily and as needed   Calcium Carb-Cholecalciferol (CALCIUM 500+D3 PO) Take 1 tablet by mouth daily.   finasteride (PROSCAR) 5 MG tablet Take 1 tablet (5 mg total) by mouth at bedtime.   gabapentin (NEURONTIN) 300 MG capsule Take 1 capsule (300 mg total) by mouth daily.   glucose blood (ACCU-CHEK AVIVA PLUS) test strip Use to check blood sugar once daily and as needed   Lancets (ACCU-CHEK MULTICLIX) lancets Use  to check blood sugar once daily and as needed   lisinopril (ZESTRIL) 10 MG tablet TAKE 1 TABLET AT BEDTIME   loratadine (CLARITIN) 10 MG tablet Take 1 tablet (10 mg total) by mouth daily.   rosuvastatin (CRESTOR) 40 MG tablet TAKE 1 TABLET EVERY DAY   tamsulosin (FLOMAX) 0.4 MG CAPS capsule Take 1 capsule (0.4 mg total) by mouth daily after breakfast.   [DISCONTINUED] oxyCODONE (ROXICODONE) 5 MG immediate release tablet Take 1 tablet (5 mg total) by mouth every 6 (six) hours as needed.   No facility-administered medications prior to visit.    Review of Systems     Objective    BP 133/84 (BP Location: Right Arm, Patient Position: Sitting, Cuff Size: Normal)   Pulse 83   Temp 98.4 F (36.9 C) (Oral)   Wt 168 lb (76.2 kg)   SpO2 93%   BMI 26.31 kg/m    Physical Exam Vitals reviewed.  Constitutional:      General: He is not in acute distress.    Appearance: Normal appearance. He is not ill-appearing, toxic-appearing or diaphoretic.  Eyes:     Conjunctiva/sclera: Conjunctivae normal.  Cardiovascular:     Rate and Rhythm: Normal rate and regular rhythm.     Pulses: Normal pulses.     Heart sounds: Normal heart sounds. No murmur  heard.    No friction rub. No gallop.  Pulmonary:     Effort: Pulmonary effort is normal. No respiratory distress.     Breath sounds: Normal breath sounds. No stridor. No wheezing, rhonchi or rales.  Abdominal:     General: Bowel sounds are normal. There is no distension.     Palpations: Abdomen is soft.     Tenderness: There is no abdominal tenderness.  Musculoskeletal:     Right lower leg: No deformity or tenderness. No edema.     Left lower leg: No deformity or tenderness. No edema.     Comments: Pain with movement of right LE, no joint swelling or erythema noted  Skin:    Findings: No erythema or rash.  Neurological:     Mental Status: He is alert and oriented to person, place, and time.       Results for orders placed or performed in visit on 10/07/22  Basic Metabolic Panel (BMET)  Result Value Ref Range   Glucose 183 (H) 70 - 99 mg/dL   BUN 13 8 - 27 mg/dL   Creatinine, Ser 1.61 0.76 - 1.27 mg/dL   eGFR 83 >09 UE/AVW/0.98   BUN/Creatinine Ratio 14 10 - 24   Sodium 139 134 - 144 mmol/L   Potassium 4.6 3.5 - 5.2 mmol/L   Chloride 102 96 - 106 mmol/L   CO2 24 20 - 29 mmol/L   Calcium 10.0 8.6 - 10.2 mg/dL  TSH + free T4  Result Value Ref Range   TSH 0.430 (L) 0.450 - 4.500 uIU/mL   Free T4 1.19 0.82 - 1.77 ng/dL  Hemoglobin J1B  Result Value Ref Range   Hgb A1c MFr Bld 7.1 (H) 4.8 - 5.6 %   Est. average glucose Bld gHb Est-mCnc 157 mg/dL    Assessment & Plan     Problem List Items Addressed This Visit       Cardiovascular and Mediastinum   Essential (primary) hypertension - Primary    Controlled BP at goal Continue lisinopril 10mg  daily  BMP collected today        Relevant Orders   Basic Metabolic Panel (BMET) (Completed)     Respiratory   Apnea, sleep    Chronic problem  Asymptomatic Does not use CPAP currently  No intervention recommended         Relevant Orders   Basic Metabolic Panel (BMET) (Completed)     Endocrine   Hyperthyroidism     Chronic  Asymptomatic  TSH and free T4 collected today        Relevant Orders   TSH + free T4 (Completed)   Diabetes mellitus with diabetic neuropathy (HCC)    Chronic  Continues to have neuropathy symptoms  Will start cymbalta 30mg  daily for neuropathy symptoms  Continue gabapentin 300mg  daily        Relevant Orders   Hemoglobin A1c (Completed)     Other   Leg pain, anterior, right    Chronic, progressive problem  Will order xrays of right tibia and fibula  Considered as needed tramadol,however, listed interaction with cymbalta so will trial cymbalta first and consider tramadol if patient still experiencing pain        Relevant Orders   DG Tibia/Fibula Right     Return in about 1 month (around 11/07/2022) for neuropahthy .        The entirety of the information documented in the History of Present Illness, Review of Systems and Physical Exam were personally obtained by me. Portions of this information were initially documented by Adline Peals, CMA. I, Ronnald Ramp, MD have reviewed the documentation above for thoroughness and accuracy.    Ronnald Ramp, MD  [facility] [phone] (phone) [phone] (fax)  [facility]

## 2022-10-07 NOTE — Patient Instructions (Addendum)
Please report to [facility] located at:  [address]  [place], [place] [address]  You do not need an appointment to have xrays completed.   Our office will follow up with  results once available.   We will follow up with results of labs once they are available.    I have prescribed Cymbalta to help with your nerve pain. Please take 30mg  once daily.

## 2022-10-08 ENCOUNTER — Encounter: Payer: Self-pay | Admitting: Family Medicine

## 2022-10-08 ENCOUNTER — Other Ambulatory Visit: Payer: Self-pay

## 2022-10-08 DIAGNOSIS — M79604 Pain in right leg: Secondary | ICD-10-CM | POA: Insufficient documentation

## 2022-10-08 DIAGNOSIS — R7989 Other specified abnormal findings of blood chemistry: Secondary | ICD-10-CM

## 2022-10-08 LAB — BASIC METABOLIC PANEL
BUN/Creatinine Ratio: 14 (ref 10–24)
BUN: 13 mg/dL (ref 8–27)
CO2: 24 mmol/L (ref 20–29)
Calcium: 10 mg/dL (ref 8.6–10.2)
Chloride: 102 mmol/L (ref 96–106)
Creatinine, Ser: 0.91 mg/dL (ref 0.76–1.27)
Glucose: 183 mg/dL — ABNORMAL HIGH (ref 70–99)
Potassium: 4.6 mmol/L (ref 3.5–5.2)
Sodium: 139 mmol/L (ref 134–144)
eGFR: 83 mL/min/{1.73_m2} (ref >59)

## 2022-10-08 LAB — HEMOGLOBIN A1C
Est. average glucose Bld gHb Est-mCnc: 157 mg/dL
Hgb A1c MFr Bld: 7.1 % — ABNORMAL HIGH (ref 4.8–5.6)

## 2022-10-08 LAB — TSH+FREE T4
Free T4: 1.19 ng/dL (ref 0.82–1.77)
TSH: 0.43 u[IU]/mL — ABNORMAL LOW (ref 0.450–4.500)

## 2022-10-08 NOTE — Assessment & Plan Note (Addendum)
Controlled BP at goal Continue lisinopril 10mg  daily  BMP collected today

## 2022-10-08 NOTE — Assessment & Plan Note (Signed)
Chronic  Asymptomatic  TSH and free T4 collected today

## 2022-10-08 NOTE — Assessment & Plan Note (Signed)
Chronic, progressive problem  Will order xrays of right tibia and fibula  Considered as needed tramadol,however, listed interaction with cymbalta so will trial cymbalta first and consider tramadol if patient still experiencing pain

## 2022-10-08 NOTE — Assessment & Plan Note (Signed)
Chronic  Continues to have neuropathy symptoms  Will start cymbalta 30mg  daily for neuropathy symptoms  Continue gabapentin 300mg  daily

## 2022-10-08 NOTE — Assessment & Plan Note (Signed)
Chronic problem  Asymptomatic Does not use CPAP currently  No intervention recommended

## 2022-11-04 ENCOUNTER — Ambulatory Visit: Payer: Medicare HMO

## 2022-11-06 ENCOUNTER — Ambulatory Visit
Admission: RE | Admit: 2022-11-06 | Discharge: 2022-11-06 | Disposition: A | Discharge status: Home / Self Care | Payer: Medicare HMO | Source: Ambulatory Visit | Attending: Urology | Admitting: Urology

## 2022-11-06 ENCOUNTER — Other Ambulatory Visit: Payer: Self-pay | Admitting: Urology

## 2022-11-06 DIAGNOSIS — N138 Other obstructive and reflux uropathy: Secondary | ICD-10-CM | POA: Diagnosis not present

## 2022-11-06 DIAGNOSIS — N200 Calculus of kidney: Secondary | ICD-10-CM | POA: Diagnosis not present

## 2022-11-06 DIAGNOSIS — N401 Enlarged prostate with lower urinary tract symptoms: Secondary | ICD-10-CM | POA: Diagnosis not present

## 2022-11-07 ENCOUNTER — Ambulatory Visit: Payer: Medicare HMO | Admitting: Urology

## 2022-11-12 ENCOUNTER — Encounter: Payer: Self-pay | Admitting: Urology

## 2022-11-12 ENCOUNTER — Ambulatory Visit: Payer: Medicare HMO | Admitting: Urology

## 2022-11-12 VITALS — BP 151/78 | HR 88 | Ht 66.0 in | Wt 168.2 lb

## 2022-11-12 DIAGNOSIS — N401 Enlarged prostate with lower urinary tract symptoms: Secondary | ICD-10-CM

## 2022-11-12 DIAGNOSIS — N138 Other obstructive and reflux uropathy: Secondary | ICD-10-CM

## 2022-11-12 LAB — BLADDER SCAN AMB NON-IMAGING

## 2022-11-12 MED ORDER — TAMSULOSIN HCL 0.4 MG PO CAPS: 0.4000 mg | ORAL_CAPSULE | Freq: Every day | ORAL | 3 refills | Status: DC

## 2022-11-12 MED ORDER — FINASTERIDE 5 MG PO TABS: 5.0000 mg | ORAL_TABLET | Freq: Every day | ORAL | 3 refills | Status: DC
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
## 2022-12-10 ENCOUNTER — Other Ambulatory Visit: Payer: Self-pay | Admitting: Urology

## 2022-12-10 ENCOUNTER — Other Ambulatory Visit: Payer: Self-pay | Admitting: Physician Assistant

## 2022-12-10 DIAGNOSIS — I10 Essential (primary) hypertension: Secondary | ICD-10-CM | POA: Diagnosis not present

## 2022-12-10 DIAGNOSIS — R0609 Other forms of dyspnea: Secondary | ICD-10-CM | POA: Diagnosis not present

## 2022-12-10 DIAGNOSIS — I495 Sick sinus syndrome: Secondary | ICD-10-CM | POA: Diagnosis not present

## 2022-12-10 DIAGNOSIS — E119 Type 2 diabetes mellitus without complications: Secondary | ICD-10-CM | POA: Diagnosis not present

## 2022-12-10 DIAGNOSIS — G629 Polyneuropathy, unspecified: Secondary | ICD-10-CM | POA: Diagnosis not present

## 2022-12-10 DIAGNOSIS — E1140 Type 2 diabetes mellitus with diabetic neuropathy, unspecified: Secondary | ICD-10-CM

## 2022-12-10 DIAGNOSIS — Z950 Presence of cardiac pacemaker: Secondary | ICD-10-CM | POA: Diagnosis not present

## 2022-12-10 DIAGNOSIS — I442 Atrioventricular block, complete: Secondary | ICD-10-CM | POA: Diagnosis not present

## 2022-12-10 DIAGNOSIS — I2510 Atherosclerotic heart disease of native coronary artery without angina pectoris: Secondary | ICD-10-CM | POA: Diagnosis not present

## 2022-12-10 DIAGNOSIS — E782 Mixed hyperlipidemia: Secondary | ICD-10-CM | POA: Diagnosis not present

## 2023-01-07 DIAGNOSIS — I495 Sick sinus syndrome: Secondary | ICD-10-CM | POA: Diagnosis not present

## 2023-02-24 ENCOUNTER — Other Ambulatory Visit: Payer: Self-pay | Admitting: Family Medicine

## 2023-02-24 NOTE — Telephone Encounter (Signed)
Medication Refill - Medication: gabapentin (NEURONTIN) 300 MG capsule   Has the patient contacted their pharmacy? Yes.     Preferred Pharmacy (with phone number or street name):  [facility] - [place], [place] - [address]  [address] [place] [address]  Phone:  [phone]  Fax:  [phone]     Has the patient been seen for an appointment in the last year OR does the patient have an upcoming appointment? Yes.    Agent: Please be advised that RX refills may take up to 3 business days. We ask that you follow-up with your pharmacy.

## 2023-02-25 NOTE — Telephone Encounter (Signed)
Requested medications are due for refill today.  unsure  Requested medications are on the active medications list.  yes  Last refill. 03/04/2019 #90 3 rf  Future visit scheduled.   no  Notes to clinic.  Rx signed by Dr. Sullivan Lone. Last filled 2020.    Requested Prescriptions  Pending Prescriptions Disp Refills   gabapentin (NEURONTIN) 300 MG capsule 90 capsule 3    Sig: Take 1 capsule (300 mg total) by mouth daily.     Neurology: Anticonvulsants - gabapentin Passed - 02/24/2023  9:10 AM      Passed - Cr in normal range and within 360 days    Creat  Date Value Ref Range Status  04/15/2017 0.75 0.70 - 1.11 mg/dL Final    Comment:    For patients >34 years of age, the reference limit for Creatinine is approximately 13% higher for people identified as African-American. .    Creatinine, Ser  Date Value Ref Range Status  10/07/2022 0.91 0.76 - 1.27 mg/dL Final         Passed - Completed PHQ-2 or PHQ-9 in the last 360 days      Passed - Valid encounter within last 12 months    Recent Outpatient Visits           4 months ago Essential (primary) hypertension   [place] [facility] Simmons-Robinson, [place], MD   7 months ago Closed fracture dislocation of sternum   [place] [facility] Simmons-Robinson, [place], MD   8 months ago Closed fracture dislocation of sternum   [place] [facility] Simmons-Robinson, [place], MD   10 months ago Type 2 diabetes mellitus with diabetic neuropathy, without long-term current use of insulin (HCC)   [place] [facility] Edwards, [place], PA-C   1 year ago Type 2 diabetes mellitus with diabetic neuropathy, without long-term current use of insulin (HCC)   [place] [facility] Bosie Clos, MD       Future Appointments             In 8 months Richardo Hanks, Laurette Schimke, MD [facility] [place]

## 2023-02-27 MED ORDER — GABAPENTIN 300 MG PO CAPS: 300.0000 mg | ORAL_CAPSULE | Freq: Every day | ORAL | 3 refills | Status: DC

## 2023-03-18 ENCOUNTER — Ambulatory Visit: Payer: Medicare HMO

## 2023-03-18 VITALS — BP 134/70 | Ht 66.0 in | Wt 174.4 lb

## 2023-03-18 DIAGNOSIS — Z0000 Encounter for general adult medical examination without abnormal findings: Secondary | ICD-10-CM | POA: Diagnosis not present

## 2023-03-18 DIAGNOSIS — Z23 Encounter for immunization: Secondary | ICD-10-CM | POA: Diagnosis not present

## 2023-03-18 NOTE — Patient Instructions (Signed)
Mr. Mckay , Thank you for taking time to come for your Medicare Wellness Visit. I appreciate your ongoing commitment to your health goals. Please review the following plan we discussed and let me know if I can assist you in the future.   Referrals/Orders/Follow-Ups/Clinician Recommendations: none  This is a list of the screening recommended for you and due dates:  Health Maintenance  Topic Date Due   Zoster (Shingles) Vaccine (1 of 2) Never done   DTaP/Tdap/Td vaccine (2 - Td or Tdap) 02/22/2021   Eye exam for diabetics  04/10/2021   COVID-19 Vaccine (4 - 2023-24 season) 02/02/2023   Complete foot exam   04/09/2023   Hemoglobin A1C  04/09/2023   Medicare Annual Wellness Visit  03/17/2024   Pneumonia Vaccine  Completed   Flu Shot  Completed   HPV Vaccine  Aged Out    Advanced directives: (Copy Requested) Please bring a copy of your health care power of attorney and living will to the office to be added to your chart at your convenience.  Next Medicare Annual Wellness Visit scheduled for next year: Yes 03/24/23 @ 10:35am in person

## 2023-03-18 NOTE — Progress Notes (Signed)
Subjective:   Todd Ortega is a 88 y.o. male who presents for Medicare Annual/Subsequent preventive examination.  Visit Complete: In person  Patient Medicare AWV questionnaire was completed by the patient on (not done); I have confirmed that all information answered by patient is correct and no changes since this date. Cardiac Risk Factors include: advanced age (>38men, >51 women);diabetes mellitus;dyslipidemia;hypertension;male gender;sedentary lifestyle    Objective:    Today's Vitals   03/18/23 1123  BP: 134/70  Weight: 174 lb 6.4 oz (79.1 kg)  Height: 5\' 6"  (1.676 m)   Body mass index is 28.15 kg/m.     03/18/2023   11:33 AM 06/15/2022    5:51 PM 04/15/2022    9:53 AM 06/12/2020   12:44 PM 05/13/2020    3:31 PM 02/24/2020    8:37 AM 02/15/2019    8:43 AM  Advanced Directives  Does Patient Have a Medical Advance Directive? Yes Yes No Yes No Yes No  Type of Advance Directive Living will;Healthcare Power of [employer] Power of [place];Living will  Healthcare Power of [place];Living will  Healthcare Power of [place];Living will   Does patient want to make changes to medical advance directive?       No - Patient declined  Copy of Healthcare Power of Attorney in Chart? No - copy requested     No - copy requested   Would patient like information on creating a medical advance directive?   No - Patient declined  No - Patient declined      Current Medications (verified) Outpatient Encounter Medications as of 03/18/2023  Medication Sig   ACCU-CHEK GUIDE test strip USE TO CHECK BLOOD SUGAR ONCE DAILY AND AS NEEDED   acetaminophen (TYLENOL) 650 MG CR tablet Take 650 mg by mouth every 8 (eight) hours as needed for pain.   ascorbic acid (VITAMIN C) 500 MG tablet Take 500 mg by mouth daily.   aspirin EC 81 MG tablet Take 81 mg by mouth at bedtime. Swallow whole.   Blood Glucose Monitoring Suppl (ACCU-CHEK AVIVA PLUS) w/Device KIT Use to check blood sugar once daily and as  needed   Calcium Carb-Cholecalciferol (CALCIUM 500+D3 PO) Take 1 tablet by mouth daily.   DULoxetine (CYMBALTA) 30 MG capsule Take 1 capsule (30 mg total) by mouth daily.   finasteride (PROSCAR) 5 MG tablet Take 1 tablet (5 mg total) by mouth at bedtime.   gabapentin (NEURONTIN) 300 MG capsule Take 1 capsule (300 mg total) by mouth daily.   Lancets (ACCU-CHEK MULTICLIX) lancets Use  to check blood sugar once daily and as needed   lisinopril (ZESTRIL) 10 MG tablet TAKE 1 TABLET AT BEDTIME   loratadine (CLARITIN) 10 MG tablet Take 1 tablet (10 mg total) by mouth daily.   rosuvastatin (CRESTOR) 40 MG tablet TAKE 1 TABLET EVERY DAY   tamsulosin (FLOMAX) 0.4 MG CAPS capsule Take 1 capsule (0.4 mg total) by mouth daily after breakfast.   No facility-administered encounter medications on file as of 03/18/2023.    Allergies (verified) Venlafaxine   History: Past Medical History:  Diagnosis Date   Arthritis    BPH (benign prostatic hypertrophy)    Chronic kidney disease    Complete heart block (HCC)    Complication of anesthesia    Hard to wake.   Coronary artery disease    Diabetes mellitus without complication (HCC)    Foot drop, right foot    Hyperlipidemia    Hypertension    Hyperthyroidism    Nephrolithiasis  Neuropathy    Severe sepsis with septic shock (HCC) 05/13/2020   Shortness of breath dyspnea    Sleep apnea    Does not use CPAP   Past Surgical History:  Procedure Laterality Date   CARDIAC CATHETERIZATION     CYSTOSCOPY W/ RETROGRADES Right 06/16/2020   Procedure: CYSTOSCOPY WITH RETROGRADE PYELOGRAM;  Surgeon: Sondra Come, MD;  Location: [facility] ORS;  Service: Urology;  Laterality: Right;   CYSTOSCOPY WITH STENT PLACEMENT Right 05/13/2020   Procedure: CYSTOSCOPY RETROGRADE LASER  WITH STENT PLACEMENT RIGHT;  Surgeon: Sondra Come, MD;  Location: [facility] OR[facility];  Service: Urology;  Laterality: Right;   CYSTOSCOPY/URETEROSCOPY/HOLMIUM LASER/STENT PLACEMENT Right  06/16/2020   Procedure: CYSTOSCOPY/URETEROSCOPY/HOLMIUM LASER/STENT EXCHANGE;  Surgeon: Sondra Come, MD;  Location: [facility] ORS;  Service: Urology;  Laterality: Right;   EYE SURGERY     HERNIA REPAIR Bilateral    Inguinal Hernia Repair   MASS EXCISION     removed from left hand   PACEMAKER INSERTION N/A 04/20/2015   Procedure: INSERTION PACEMAKER;  Surgeon: Marcina Millard, MD;  Location: [facility] ORS;  Service: Cardiovascular;  Laterality: N/A;   Family History  Problem Relation Age of Onset   Heart attack Mother    Gout Mother    Diabetes Mother    Hypertension Mother    Hyperlipidemia Mother    Lung cancer Father    Drug abuse Brother    Social History   Socioeconomic History   Marital status: Married    Spouse name: Not on file   Number of children: 2   Years of education: Not on file   Highest education level: Some college, no degree  Occupational History   Occupation: retired   Occupation: Merchant navy officer    Comment: part time  Tobacco Use   Smoking status: Never    Passive exposure: Never   Smokeless tobacco: Never  Vaping Use   Vaping status: Never Used  Substance and Sexual Activity   Alcohol use: No    Alcohol/week: 0.0 standard drinks of alcohol   Drug use: No   Sexual activity: Yes    Birth control/protection: None  Other Topics Concern   Not on file  Social History Narrative   Not on file   Social Determinants of Health   Financial Resource Strain: Low Risk  (03/18/2023)   Overall Financial Resource Strain (CARDIA)    Difficulty of Paying Living Expenses: Not hard at all  Food Insecurity: No Food Insecurity (03/18/2023)   Hunger Vital Sign    Worried About Running Out of Food in the Last Year: Never true    Ran Out of Food in the Last Year: Never true  Transportation Needs: No Transportation Needs (03/18/2023)   PRAPARE - Administrator, Civil Service (Medical): No    Lack of Transportation (Non-Medical): No  Physical Activity:  Inactive (03/18/2023)   Exercise Vital Sign    Days of Exercise per Week: 0 days    Minutes of Exercise per Session: 0 min  Stress: No Stress Concern Present (04/15/2022)   [employer] of Occupational Health - Occupational Stress Questionnaire    Feeling of Stress : Not at all  Social Connections: Socially Integrated (03/18/2023)   Social Connection and Isolation Panel [NHANES]    Frequency of Communication with Friends and Family: More than three times a week    Frequency of Social Gatherings with Friends and Family: Twice a week    Attends Religious Services: More than 4 times  per year    Active Member of Clubs or Organizations: Yes    Attends Banker Meetings: More than 4 times per year    Marital Status: Married    Tobacco Counseling Counseling given: Not Answered   Clinical Intake:  Pre-visit preparation completed: Yes  Pain : No/denies pain    BMI - recorded: 28.15 Nutritional Status: BMI 25 -29 Overweight Nutritional Risks: None Diabetes: Yes CBG done?: No Did pt. bring in CBG monitor from home?: No  How often do you need to have someone help you when you read instructions, pamphlets, or other written materials from your doctor or pharmacy?: 1 - Never  Interpreter Needed?: No  Comments: lives with wife Information entered by :: B.Idabelle Mcpeters,LPN   Activities of Daily Living    03/18/2023   11:33 AM 04/15/2022    9:55 AM  In your present state of health, do you have any difficulty performing the following activities:  Hearing? 1 0  Vision? 1 0  Difficulty concentrating or making decisions? 0 0  Walking or climbing stairs? 1 1  Dressing or bathing? 0 0  Doing errands, shopping? 0 0  Preparing Food and eating ? N N  Using the Toilet? N N  In the past six months, have you accidently leaked urine? N N  Do you have problems with loss of bowel control? N N  Managing your Medications? N N  Managing your Finances? N N  Housekeeping or  managing your Housekeeping? N N    Patient Care Team: Ronnald Ramp, MD as PCP - General (Family Medicine) Alwyn Pea, MD as Consulting Physician (Cardiology) Lonell Face, MD as Consulting Physician (Neurology) Harlin Rain Wandra Arthurs (Neurology)  Indicate any recent Medical Services you may have received from other than [facility] providers in the past year (date may be approximate).     Assessment:   This is a routine wellness examination for [place].  Hearing/Vision screen Hearing Screening - Comments:: Pt says his hearing is the same:can hear most of the time, needs no hearing intervention Vision Screening - Comments:: Pt says left eye has cataract and is little blurry. Vision good in rt eye (no longer needs glasses) Dr Clydene Pugh   Goals Addressed             This Visit's Progress    COMPLETED: DIET - EAT MORE FRUITS AND VEGETABLES   On track    Increase water intake   Not on track    Recommend increasing water intake to 4-6 glasses a day.        Depression Screen    03/18/2023   11:30 AM 06/25/2022    1:19 PM 04/15/2022    9:51 AM 12/11/2021   11:02 AM 07/31/2021   11:01 AM 02/28/2021   10:50 AM 08/23/2020    9:54 AM  PHQ 2/9 Scores  PHQ - 2 Score 0 0 0 0 0 0 0  PHQ- 9 Score   0  0 0 1    Fall Risk    03/18/2023   11:27 AM 06/25/2022    1:19 PM 04/15/2022    9:54 AM 12/11/2021   11:02 AM 07/31/2021   11:00 AM  Fall Risk   Falls in the past year? 0 0 0 0 0  Number falls in past yr: 0 0 0 0   Injury with Fall? 0 0 0 0   Risk for fall due to : Impaired balance/gait;Impaired vision No Fall Risks No Fall Risks  Follow up Falls prevention discussed;Education provided  Falls prevention discussed;Falls evaluation completed      MEDICARE RISK AT HOME: Medicare Risk at Home Any stairs in or around the home?: Yes If so, are there any without handrails?: Yes Home free of loose throw rugs in walkways, pet beds, electrical cords, etc?: Yes Adequate  lighting in your home to reduce risk of falls?: Yes Life alert?: No Use of a cane, walker or w/c?: Yes Grab bars in the bathroom?: Yes Shower chair or bench in shower?: Yes Elevated toilet seat or a handicapped toilet?: Yes  TIMED UP AND GO:  Was the test performed?  Yes  Length of time to ambulate 10 feet: 15 sec Gait slow and steady with assistive device    Cognitive Function:        03/18/2023   11:35 AM 04/15/2022    9:55 AM 04/12/2016    3:22 PM  6CIT Screen  What Year? 0 points 0 points 0 points  What month? 0 points 0 points 0 points  What time? 0 points 0 points 0 points  Count back from 20 0 points 0 points 0 points  Months in reverse 0 points 0 points 0 points  Repeat phrase 0 points 0 points 2 points  Total Score 0 points 0 points 2 points    Immunizations Immunization History  Administered Date(s) Administered   Fluad Quad(high Dose 65+) 02/16/2019, 02/24/2020, 02/28/2021, 03/20/2022   Fluad Trivalent(High Dose 65+) 03/18/2023   Influenza, High Dose Seasonal PF 03/31/2016, 02/21/2017, 02/12/2018   Influenza,inj,Quad PF,6+ Mos 04/21/2015   Influenza-Unspecified 04/21/2015   Moderna Sars-Covid-2 Vaccination 06/15/2019, 07/13/2019, 03/30/2020   Pneumococcal Conjugate-13 03/07/2014   Pneumococcal Polysaccharide-23 12/04/2011   Tdap 02/23/2011    TDAP status: Up to date  Flu Vaccine status: Completed at today's visit  Pneumococcal vaccine status: Up to date  Covid-19 vaccine status: Completed vaccines  Qualifies for Shingles Vaccine? Yes   Zostavax completed No   Shingrix Completed?: No.    Education has been provided regarding the importance of this vaccine. Patient has been advised to call insurance company to determine out of pocket expense if they have not yet received this vaccine. Advised may also receive vaccine at local pharmacy or Health Dept. Verbalized acceptance and understanding.  Screening Tests Health Maintenance  Topic Date Due    Zoster Vaccines- Shingrix (1 of 2) Never done   DTaP/Tdap/Td (2 - Td or Tdap) 02/22/2021   OPHTHALMOLOGY EXAM  04/10/2021   COVID-19 Vaccine (4 - 2023-24 season) 02/02/2023   FOOT EXAM  04/09/2023   HEMOGLOBIN A1C  04/09/2023   Medicare Annual Wellness (AWV)  03/17/2024   Pneumonia Vaccine 72+ Years old  Completed   INFLUENZA VACCINE  Completed   HPV VACCINES  Aged Out    Health Maintenance  Health Maintenance Due  Topic Date Due   Zoster Vaccines- Shingrix (1 of 2) Never done   DTaP/Tdap/Td (2 - Td or Tdap) 02/22/2021   OPHTHALMOLOGY EXAM  04/10/2021   COVID-19 Vaccine (4 - 2023-24 season) 02/02/2023    Colorectal cancer screening: No longer required.   Lung Cancer Screening: (Low Dose CT Chest recommended if Age 21-80 years, 20 pack-year currently smoking OR have quit w/in 15years.) does not qualify.   Lung Cancer Screening Referral: no  Additional Screening:  Hepatitis C Screening: does not qualify; Completed no  Vision Screening: Recommended annual ophthalmology exams for early detection of glaucoma and other disorders of the eye. Is the patient up to  date with their annual eye exam?  Yes  Who is the provider or what is the name of the office in which the patient attends annual eye exams? Dr Clydene Pugh If pt is not established with a provider, would they like to be referred to a provider to establish care? No .   Dental Screening: Recommended annual dental exams for proper oral hygiene  Diabetic Foot Exam: Diabetic Foot Exam: Overdue, Pt has been advised about the importance in completing this exam. Pt is scheduled for diabetic foot exam on next PCP visit.  Community Resource Referral / Chronic Care Management: CRR required this visit?  No   CCM required this visit?  No    Plan:     I have personally reviewed and noted the following in the patient's chart:   Medical and social history Use of alcohol, tobacco or illicit drugs  Current medications and supplements  including opioid prescriptions. Patient is not currently taking opioid prescriptions. Functional ability and status Nutritional status Physical activity Advanced directives List of other physicians Hospitalizations, surgeries, and ER visits in previous 12 months Vitals Screenings to include cognitive, depression, and falls Referrals and appointments  In addition, I have reviewed and discussed with patient certain preventive protocols, quality metrics, and best practice recommendations. A written personalized care plan for preventive services as well as general preventive health recommendations were provided to patient.    Sue Lush, LPN   [id number]   After Visit Summary: (In Person-Printed) AVS printed and given to the patient  Nurse Notes: The patient states they are doing well and has no concerns or questions at this time.  *Influenza vaccine given today

## 2023-05-13 DIAGNOSIS — I495 Sick sinus syndrome: Secondary | ICD-10-CM | POA: Diagnosis not present

## 2023-06-11 DIAGNOSIS — Z950 Presence of cardiac pacemaker: Secondary | ICD-10-CM | POA: Diagnosis not present

## 2023-06-11 DIAGNOSIS — I495 Sick sinus syndrome: Secondary | ICD-10-CM | POA: Diagnosis not present

## 2023-06-19 DIAGNOSIS — E119 Type 2 diabetes mellitus without complications: Secondary | ICD-10-CM | POA: Diagnosis not present

## 2023-06-19 DIAGNOSIS — G629 Polyneuropathy, unspecified: Secondary | ICD-10-CM | POA: Diagnosis not present

## 2023-06-19 DIAGNOSIS — I495 Sick sinus syndrome: Secondary | ICD-10-CM | POA: Diagnosis not present

## 2023-06-19 DIAGNOSIS — I442 Atrioventricular block, complete: Secondary | ICD-10-CM | POA: Diagnosis not present

## 2023-06-19 DIAGNOSIS — Z950 Presence of cardiac pacemaker: Secondary | ICD-10-CM | POA: Diagnosis not present

## 2023-06-19 DIAGNOSIS — I10 Essential (primary) hypertension: Secondary | ICD-10-CM | POA: Diagnosis not present

## 2023-06-19 DIAGNOSIS — E782 Mixed hyperlipidemia: Secondary | ICD-10-CM | POA: Diagnosis not present

## 2023-06-19 DIAGNOSIS — R0609 Other forms of dyspnea: Secondary | ICD-10-CM | POA: Diagnosis not present

## 2023-07-16 ENCOUNTER — Ambulatory Visit: Payer: Medicare HMO | Admitting: Family Medicine

## 2023-07-16 ENCOUNTER — Encounter: Payer: Self-pay | Admitting: Family Medicine

## 2023-07-16 VITALS — BP 138/81 | HR 67 | Ht 66.0 in | Wt 171.0 lb

## 2023-07-16 DIAGNOSIS — N138 Other obstructive and reflux uropathy: Secondary | ICD-10-CM | POA: Diagnosis not present

## 2023-07-16 DIAGNOSIS — E7849 Other hyperlipidemia: Secondary | ICD-10-CM | POA: Diagnosis not present

## 2023-07-16 DIAGNOSIS — E1140 Type 2 diabetes mellitus with diabetic neuropathy, unspecified: Secondary | ICD-10-CM

## 2023-07-16 DIAGNOSIS — I10 Essential (primary) hypertension: Secondary | ICD-10-CM | POA: Diagnosis not present

## 2023-07-16 DIAGNOSIS — G629 Polyneuropathy, unspecified: Secondary | ICD-10-CM

## 2023-07-16 DIAGNOSIS — N401 Enlarged prostate with lower urinary tract symptoms: Secondary | ICD-10-CM | POA: Diagnosis not present

## 2023-07-16 DIAGNOSIS — I2510 Atherosclerotic heart disease of native coronary artery without angina pectoris: Secondary | ICD-10-CM

## 2023-07-16 LAB — POCT GLYCOSYLATED HEMOGLOBIN (HGB A1C): Hemoglobin A1C: 7.6 % — AB (ref 4.0–5.6)

## 2023-07-16 MED ORDER — RYBELSUS 3 MG PO TABS
3.0000 mg | ORAL_TABLET | Freq: Every day | ORAL | 0 refills | Status: DC
Start: 2023-07-16 — End: 2023-09-29

## 2023-07-16 NOTE — Assessment & Plan Note (Signed)
chronic Managed with Crestor 40 mg daily. - Continue Crestor 40 mg daily

## 2023-07-16 NOTE — Assessment & Plan Note (Signed)
Type 2 diabetes with neuropathy. Blood sugar levels range from 117 to 130 during the day, higher in the mornings (150s to 202). Last A1c was 7.1, rechecked today at 7.6. Not on diabetes medications. Discussed starting Rybelsus to lower A1c, protect heart and kidneys, and manage weight. Alternative medications include Florene Route, and [place], with respective benefits and risks. Patient prefers to avoid injections. Discussed cost concerns and potential assistance programs. chronic - Prescribe Rybelsus 3 mg daily - Order metabolic panel - Provide dietary counseling to reduce sugary drinks, desserts, bread, pasta, and rice - Coordinate with pharmacy for cost assistance for Rybelsus

## 2023-07-16 NOTE — Patient Instructions (Signed)
VISIT SUMMARY:  Today, we discussed your type 2 diabetes, neuropathy, and other ongoing health conditions. We reviewed your blood sugar levels, A1c results, and current medications. We also talked about starting a new medication for diabetes and provided dietary advice. Additionally, we reviewed your management plan for neuropathy, hypertension, coronary artery disease, hyperlipidemia, and benign prostatic hyperplasia.  YOUR PLAN:  -TYPE 2 DIABETES MELLITUS WITH NEUROPATHY: Type 2 diabetes is a condition where your body does not use insulin properly, leading to high blood sugar levels. Neuropathy is nerve damage often caused by diabetes. Your blood sugar levels have been fluctuating, and your A1c has increased to 7.6. We will start you on Rybelsus 3 mg daily to help lower your A1c, protect your heart and kidneys, and manage your weight. We also discussed other medication options and cost assistance programs. Additionally, we recommend reducing sugary drinks, desserts, bread, pasta, and rice in your diet.  -PERIPHERAL NEUROPATHY: Peripheral neuropathy is nerve damage that causes decreased sensation, often in the feet. You are currently taking gabapentin 300 mg daily, which you will continue. We discussed the potential benefits of seeing a podiatrist, but you prefer to continue with your current management plan.  -HYPERTENSION: Hypertension is high blood pressure. Your blood pressure is currently well-managed with lisinopril 10 mg daily, which you will continue.  -CORONARY ARTERY DISEASE: Coronary artery disease is a condition where the blood vessels supplying the heart are narrowed or blocked. You are managing this with aspirin 81 mg daily, which you will continue.  -HYPERLIPIDEMIA: Hyperlipidemia is having high levels of fats (lipids) in your blood. You are managing this with Crestor 40 mg daily, which you will continue.  -BENIGN PROSTATIC HYPERPLASIA (BPH) WITH URINARY OBSTRUCTION: BPH is an  enlarged prostate gland that can cause urinary problems. You are managing this with Flomax 0.4 mg daily, which you will continue.  -GENERAL HEALTH MAINTENANCE: We discussed the importance of taking a multivitamin and the potential benefits of using magnesium lotion for managing neuropathy. We recommend you start taking a multivitamin and consider using magnesium lotion.  INSTRUCTIONS:  Please schedule a follow-up appointment to assess your diabetes management and the effectiveness of your new medication. We will also review your blood sugar levels and any other concerns you may have at that time.

## 2023-07-16 NOTE — Assessment & Plan Note (Signed)
Neuropathy in both feet, decreased sensation noted during foot exam. Managed with gabapentin 300 mg daily. Previous therapy for right foot drop improved balance. Discussed potential benefits of seeing a podiatrist; patient prefers current management. chronic - Continue gabapentin 300 mg daily - Consider referral to podiatrist if symptoms worsen

## 2023-07-16 NOTE — Assessment & Plan Note (Signed)
chronic Managed with lisinopril 10 mg daily. Blood pressure today was 138/81, within acceptable range. - Continue lisinopril 10 mg daily

## 2023-07-16 NOTE — Progress Notes (Signed)
Established patient visit   Patient: Todd Ortega   DOB: 25-Mar-1935   89 y.o. Male  MRN: [id number] Visit Date: 07/16/2023  Today's healthcare provider: Ronnald Ramp, MD   Chief Complaint  Patient presents with   Medical Management of Chronic Issues    He would like to discuss his blood sugars, at night his blood sugars are within normal range(117-130), but in the morning the numbers are higher (150/202)   Subjective     HPI     Medical Management of Chronic Issues    Additional comments: He would like to discuss his blood sugars, at night his blood sugars are within normal range(117-130), but in the morning the numbers are higher (150/202)      Last edited by Acey Lav, CMA on 07/16/2023  2:46 PM.       Discussed the use of AI scribe software for clinical note transcription with the patient, who gave verbal consent to proceed.  History of Present Illness   Todd Ortega is an 89 year old male with type 2 diabetes and neuropathy who presents for chronic follow-up. He is accompanied by his wife, who is also diabetic.  He has concerns about fluctuations in his blood sugar levels, which range from 117 to 130 during the day but increase to 150s to 202 in the mornings. His last A1c was 7.1, but it has increased to 7.6. He is not currently on any diabetes medications. He recalls being told by a previous doctor that he was 'borderline' and did not need medication as long as his levels remained stable. His wife experiences different blood sugar patterns.  For neuropathy, he continues to take gabapentin 300 mg daily. He describes decreased sensation in his feet, particularly in the right foot, which has been diagnosed with foot drop. He previously attended therapy for balance issues related to the foot drop, which improved his condition. He has neuropathy in both feet.  He continues to take aspirin 81 mg daily for coronary artery disease, lisinopril 10 mg daily for  hypertension, Crestor 40 mg daily for hyperlipidemia, and Flomax 0.4 mg daily for benign prostatic hyperplasia. His blood pressure is under the target of 140/90.         Past Medical History:  Diagnosis Date   Arthritis    BPH (benign prostatic hypertrophy)    Chronic kidney disease    Complete heart block (HCC)    Complication of anesthesia    Hard to wake.   Coronary artery disease    Diabetes mellitus without complication (HCC)    Foot drop, right foot    Hyperlipidemia    Hypertension    Hyperthyroidism    Nephrolithiasis    Neuropathy    Severe sepsis with septic shock (HCC) 05/13/2020   Shortness of breath dyspnea    Sleep apnea    Does not use CPAP    Medications: Outpatient Medications Prior to Visit  Medication Sig   ACCU-CHEK GUIDE test strip USE TO CHECK BLOOD SUGAR ONCE DAILY AND AS NEEDED   acetaminophen (TYLENOL) 650 MG CR tablet Take 650 mg by mouth every 8 (eight) hours as needed for pain.   ascorbic acid (VITAMIN C) 500 MG tablet Take 500 mg by mouth daily.   aspirin EC 81 MG tablet Take 81 mg by mouth at bedtime. Swallow whole.   Blood Glucose Monitoring Suppl (ACCU-CHEK AVIVA PLUS) w/Device KIT Use to check blood sugar once daily and as needed   Calcium  Carb-Cholecalciferol (CALCIUM 500+D3 PO) Take 1 tablet by mouth daily.   DULoxetine (CYMBALTA) 30 MG capsule Take 1 capsule (30 mg total) by mouth daily. (Patient not taking: Reported on 07/16/2023)   finasteride (PROSCAR) 5 MG tablet Take 1 tablet (5 mg total) by mouth at bedtime.   gabapentin (NEURONTIN) 300 MG capsule Take 1 capsule (300 mg total) by mouth daily.   Lancets (ACCU-CHEK MULTICLIX) lancets Use  to check blood sugar once daily and as needed   lisinopril (ZESTRIL) 10 MG tablet TAKE 1 TABLET AT BEDTIME   loratadine (CLARITIN) 10 MG tablet Take 1 tablet (10 mg total) by mouth daily.   rosuvastatin (CRESTOR) 40 MG tablet TAKE 1 TABLET EVERY DAY   tamsulosin (FLOMAX) 0.4 MG CAPS capsule Take 1  capsule (0.4 mg total) by mouth daily after breakfast.   No facility-administered medications prior to visit.    Review of Systems  Last metabolic panel Lab Results  Component Value Date   GLUCOSE 183 (H) 10/07/2022   NA 139 10/07/2022   K 4.6 10/07/2022   CL 102 10/07/2022   CO2 24 10/07/2022   BUN 13 10/07/2022   CREATININE 0.91 10/07/2022   EGFR 83 10/07/2022   CALCIUM 10.0 10/07/2022   PHOS 3.5 05/17/2020   PROT 7.7 06/15/2022   ALBUMIN 3.9 06/15/2022   LABGLOB 3.3 12/11/2021   AGRATIO 1.2 12/11/2021   BILITOT 0.9 06/15/2022   ALKPHOS 84 06/15/2022   AST 40 06/15/2022   ALT 40 06/15/2022   ANIONGAP 7 06/15/2022   Last lipids Lab Results  Component Value Date   CHOL 143 12/11/2021   HDL 59 12/11/2021   LDLCALC 73 12/11/2021   TRIG 53 12/11/2021   CHOLHDL 2.4 12/11/2021   Last hemoglobin A1c Lab Results  Component Value Date   HGBA1C 7.6 (A) 07/16/2023   Last thyroid functions Lab Results  Component Value Date   TSH 0.430 (L) 10/07/2022        Objective    BP 138/81 (BP Location: Left Arm, Patient Position: Sitting, Cuff Size: Normal)   Pulse 67   Ht 5\' 6"  (1.676 m)   Wt 171 lb (77.6 kg)   SpO2 100%   BMI 27.60 kg/m   BP Readings from Last 3 Encounters:  07/16/23 138/81  03/18/23 134/70  11/12/22 (!) 151/78   Wt Readings from Last 3 Encounters:  07/16/23 171 lb (77.6 kg)  03/18/23 174 lb 6.4 oz (79.1 kg)  11/12/22 168 lb 3.2 oz (76.3 kg)        Physical Exam Vitals reviewed.  Constitutional:      General: He is not in acute distress.    Appearance: Normal appearance. He is not ill-appearing, toxic-appearing or diaphoretic.  Eyes:     Conjunctiva/sclera: Conjunctivae normal.  Cardiovascular:     Rate and Rhythm: Normal rate and regular rhythm.     Pulses: Normal pulses.          Dorsalis pedis pulses are 2+ on the right side and 2+ on the left side.       Posterior tibial pulses are 2+ on the right side and 2+ on the left side.      Heart sounds: Normal heart sounds. No murmur heard.    No friction rub. No gallop.  Pulmonary:     Effort: Pulmonary effort is normal. No respiratory distress.     Breath sounds: Normal breath sounds. No stridor. No wheezing, rhonchi or rales.  Abdominal:     General:  Bowel sounds are normal. There is no distension.     Palpations: Abdomen is soft.     Tenderness: There is no abdominal tenderness.  Musculoskeletal:     Right lower leg: No edema.     Left lower leg: No edema.     Right foot: Normal range of motion. No deformity or bunion.     Left foot: Normal range of motion. No deformity or bunion.  Feet:     Right foot:     Protective Sensation: 6 sites tested.  6 sites sensed.     Skin integrity: Skin integrity normal. No erythema.     Toenail Condition: Right toenails are normal.     Left foot:     Protective Sensation: 6 sites tested.  6 sites sensed.     Skin integrity: Skin integrity normal. No erythema.     Toenail Condition: Left toenails are normal.  Skin:    Findings: No erythema or rash.  Neurological:     Mental Status: He is alert and oriented to person, place, and time.  Psychiatric:        Mood and Affect: Mood and affect normal.        Speech: Speech normal.        Behavior: Behavior normal. Behavior is cooperative.     Results for orders placed or performed in visit on 07/16/23  POCT HgB A1C  Result Value Ref Range   Hemoglobin A1C 7.6 (A) 4.0 - 5.6 %   HbA1c POC (<> result, manual entry)     HbA1c, POC (prediabetic range)     HbA1c, POC (controlled diabetic range)      Assessment & Plan     Problem List Items Addressed This Visit       Cardiovascular and Mediastinum   Essential (primary) hypertension - Primary   chronic Managed with lisinopril 10 mg daily. Blood pressure today was 138/81, within acceptable range. - Continue lisinopril 10 mg daily      BP (high blood pressure)   Relevant Orders   CMP14+EGFR   Arteriosclerosis of coronary  artery   Chronic Managed with aspirin 81 mg daily. - Continue aspirin 81 mg daily        Endocrine   Diabetes mellitus with diabetic neuropathy (HCC)   Type 2 diabetes with neuropathy. Blood sugar levels range from 117 to 130 during the day, higher in the mornings (150s to 202). Last A1c was 7.1, rechecked today at 7.6. Not on diabetes medications. Discussed starting Rybelsus to lower A1c, protect heart and kidneys, and manage weight. Alternative medications include Florene Route, and Grass Range, with respective benefits and risks. Patient prefers to avoid injections. Discussed cost concerns and potential assistance programs. chronic - Prescribe Rybelsus 3 mg daily - Order metabolic panel - Provide dietary counseling to reduce sugary drinks, desserts, bread, pasta, and rice - Coordinate with pharmacy for cost assistance for Rybelsus      Relevant Medications   Semaglutide (RYBELSUS) 3 MG TABS   Other Relevant Orders   POCT HgB A1C (Completed)   AMB Referral [facility] Care Management     Nervous and Auditory   Neuropathy   Neuropathy in both feet, decreased sensation noted during foot exam. Managed with gabapentin 300 mg daily. Previous therapy for right foot drop improved balance. Discussed potential benefits of seeing a podiatrist; patient prefers current management. chronic - Continue gabapentin 300 mg daily - Consider referral to podiatrist if symptoms worsen  Genitourinary   Benign prostatic hyperplasia with urinary obstruction   Managed with Flomax 0.4 mg daily. chronic - Continue Flomax 0.4 mg daily        Other   HLD (hyperlipidemia)   chronic Managed with Crestor 40 mg daily. - Continue Crestor 40 mg daily         Return in about 3 months (around 10/13/2023) for DM, HTN.      Ronnald Ramp, MD  [facility] [phone] (phone) [phone] (fax)  [facility]

## 2023-07-16 NOTE — Assessment & Plan Note (Signed)
Chronic Managed with aspirin 81 mg daily. - Continue aspirin 81 mg daily

## 2023-07-16 NOTE — Assessment & Plan Note (Signed)
Managed with Flomax 0.4 mg daily. chronic - Continue Flomax 0.4 mg daily

## 2023-07-17 ENCOUNTER — Telehealth: Payer: Self-pay

## 2023-07-17 LAB — CMP14+EGFR
ALT: 42 [IU]/L (ref 0–44)
AST: 39 [IU]/L (ref 0–40)
Albumin: 4.4 g/dL (ref 3.7–4.7)
Alkaline Phosphatase: 118 [IU]/L (ref 44–121)
BUN/Creatinine Ratio: 16 (ref 10–24)
BUN: 14 mg/dL (ref 8–27)
Bilirubin Total: 0.4 mg/dL (ref 0.0–1.2)
CO2: 24 mmol/L (ref 20–29)
Calcium: 10 mg/dL (ref 8.6–10.2)
Chloride: 100 mmol/L (ref 96–106)
Creatinine, Ser: 0.88 mg/dL (ref 0.76–1.27)
Globulin, Total: 3.2 g/dL (ref 1.5–4.5)
Glucose: 131 mg/dL — ABNORMAL HIGH (ref 70–99)
Potassium: 4.5 mmol/L (ref 3.5–5.2)
Sodium: 138 mmol/L (ref 134–144)
Total Protein: 7.6 g/dL (ref 6.0–8.5)
eGFR: 84 mL/min/{1.73_m2} (ref >59)

## 2023-07-17 NOTE — Progress Notes (Signed)
Care Guide Pharmacy Note  07/17/2023 Name: Todd Ortega MRN: [id number] DOB: November 26, 1934  Referred By: Ronnald Ramp, MD Reason for referral: Care Coordination (Outreach to schedule with Pharm d )   Todd Ortega is a 89 y.o. year old male who is a primary care patient of Simmons-Robinson, Tawanna Cooler, MD.  Todd Ortega was referred to the pharmacist for assistance related to: DMII  Successful contact was made with the patient to discuss pharmacy services including being ready for the pharmacist to call at least 5 minutes before the scheduled appointment time and to have medication bottles and any blood pressure readings ready for review. The patient agreed to meet with the pharmacist via telephone visit on (date/time).07/29/2023  Penne Lash , RMA     [facility]  [facility], [facility] Guide  Direct Dial: [phone]  Website: [email or web address]

## 2023-07-29 ENCOUNTER — Other Ambulatory Visit: Payer: Self-pay | Admitting: Pharmacist

## 2023-07-29 NOTE — Progress Notes (Signed)
   07/29/2023  Patient ID: Todd Ortega, male   DOB: 07-13-1934, 89 y.o.   MRN: [id number]  Called and spoke with patient on the phone today regarding Rybelsus PAP referral.   Was recently started on Rybelsus due to A1c being 7.6% and not being below 7% for the past few checks. Patient should qualify. Submitting electronic application today for approval.   Reports no samples were available at the office to sample first and would be $290 for first month at [facility] due to $250 drug deductible.     Marlowe Aschoff, PharmD [facility] Phone Number: [phone]

## 2023-08-19 ENCOUNTER — Telehealth: Payer: Self-pay

## 2023-08-19 NOTE — Progress Notes (Signed)
 Pharmacy Medication Assistance Program Note    08/19/2023  Patient ID: Todd Ortega, male   DOB: [birth year], 89 y.o.   MRN: [id number]     08/19/2023  Outreach [employer]  [employer]urer [employer] [facility] Rybelsus  Type of Radiographer, therapeutic Assistance  Patient Assistance Determination Approved  Approval Start Date 07/30/2023  Approval End Date 06/02/2024

## 2023-09-09 ENCOUNTER — Other Ambulatory Visit: Payer: Self-pay | Admitting: Family Medicine

## 2023-09-09 DIAGNOSIS — E7849 Other hyperlipidemia: Secondary | ICD-10-CM

## 2023-09-29 ENCOUNTER — Telehealth: Payer: Self-pay | Admitting: Pharmacist

## 2023-09-29 NOTE — Progress Notes (Signed)
   09/29/2023  Patient ID: Todd Ortega, male   DOB: 1935-01-06, 89 y.o.   MRN: [id number]  Called and spoke with the patient on the phone today as he would need a refill on the Rybelsus  PAP due to the way it was submitted previously.  Reports he finished up the 3mg  and only took the 7mg  for about 1 week due to side effects. Had chronic hiccups, stomach upset, no appetite, and constipation after increasing the dose. Said hiccups were the worst part of it.  Unable to refill just the Rybelsus  3mg  with Novo Nordisk PAP and patient cannot afford at the pharmacy.  Asked about his willingness to start another medication like Ozempic, and says he hopes to not need anything else at this time since his BG readings have looked better. Prior note also mentions goal of avoiding injections if possible.  BG readings: ~110 in afternoons ~140 in mornings  Options: Metformin, Januvia, SGLT2 inhibitor (caution due to BPH issues + urinary frequency associated)  Advised I will pass along the information to Dr. Branson Calandra to discuss at upcoming visit on 5/12.    Todd Ortega, PharmD [facility]  Phone Number: [phone]

## 2023-10-13 ENCOUNTER — Ambulatory Visit: Payer: Medicare HMO | Admitting: Family Medicine

## 2023-10-13 ENCOUNTER — Encounter: Payer: Self-pay | Admitting: Family Medicine

## 2023-10-13 VITALS — BP 132/67 | HR 74 | Ht 66.0 in | Wt 163.0 lb

## 2023-10-13 DIAGNOSIS — Z9989 Dependence on other enabling machines and devices: Secondary | ICD-10-CM | POA: Diagnosis not present

## 2023-10-13 DIAGNOSIS — Z7984 Long term (current) use of oral hypoglycemic drugs: Secondary | ICD-10-CM

## 2023-10-13 DIAGNOSIS — E1140 Type 2 diabetes mellitus with diabetic neuropathy, unspecified: Secondary | ICD-10-CM

## 2023-10-13 DIAGNOSIS — N138 Other obstructive and reflux uropathy: Secondary | ICD-10-CM

## 2023-10-13 DIAGNOSIS — G4739 Other sleep apnea: Secondary | ICD-10-CM | POA: Diagnosis not present

## 2023-10-13 DIAGNOSIS — I10 Essential (primary) hypertension: Secondary | ICD-10-CM

## 2023-10-13 DIAGNOSIS — E7849 Other hyperlipidemia: Secondary | ICD-10-CM

## 2023-10-13 DIAGNOSIS — N401 Enlarged prostate with lower urinary tract symptoms: Secondary | ICD-10-CM

## 2023-10-13 NOTE — Assessment & Plan Note (Signed)
 Hyperlipidemia, Chronic Hyperlipidemia is well-managed with current medication regimen. - Continue rosuvastatin  40 mg daily

## 2023-10-13 NOTE — Progress Notes (Signed)
 Established patient visit   Patient: Todd Ortega   DOB: December 24, 1934   89 y.o. Male  MRN: [id number] Visit Date: 10/13/2023  Today's healthcare provider: Mimi Alt, MD   Chief Complaint  Patient presents with   Hypertension   Diabetes    Discuss medication the increase caused some concerns    Subjective     HPI     Diabetes    Additional comments: Discuss medication the increase caused some concerns       Last edited by Bart Lieu, CMA on 10/13/2023  1:23 PM.       Discussed the use of AI scribe software for clinical note transcription with the patient, who gave verbal consent to proceed.  History of Present Illness Todd Ortega is an 89 year old male with type two diabetes, neuropathy, essential hypertension, and sleep apnea who presents for follow-up.  He has a history of type two diabetes and was previously on Rybelsus  3 mg, which he tolerated well. However, he experienced severe hiccups, stomach tightness, and loss of appetite when the dose was increased to 7 mg, leading to discontinuation. His blood glucose levels have been stable, with morning readings around 130 mg/dL and sometimes in the teens, after stopping Rybelsus . His last recorded A1c was 7.6%. He monitors his blood glucose using Accu-Chek guide test strips and an Accu-Chek Aviva Plus device.  He continues to take lisinopril  10 mg daily for hypertension and rosuvastatin  40 mg daily for hyperlipidemia. He is not currently taking Cymbalta  30 mg as previously prescribed. He takes Flomax  0.4 mg daily and finasteride  5 mg for benign prostatic hyperplasia (BPH).  He has a history of neuropathy for which he takes gabapentin  300 mg once daily. He also takes a calcium  500 mg and vitamin D3 supplement, as well as aspirin  81 mg daily.  He has a history of sleep apnea and previously used a CPAP machine but discontinued its use after retirement as he no longer experiences symptoms.  He is retired  and previously worked at a garage. He lives in [place] and has family in [place].     Past Medical History:  Diagnosis Date   Arthritis    BPH (benign prostatic hypertrophy)    Chronic kidney disease    Complete heart block (HCC)    Complication of anesthesia    Hard to wake.   Coronary artery disease    Diabetes mellitus without complication (HCC)    Foot drop, right foot    Hyperlipidemia    Hypertension    Hyperthyroidism    Nephrolithiasis    Neuropathy    Severe sepsis with septic shock (HCC) 05/13/2020   Shortness of breath dyspnea    Sleep apnea    Does not use CPAP    Medications: Outpatient Medications Prior to Visit  Medication Sig   ACCU-CHEK GUIDE test strip USE TO CHECK BLOOD SUGAR ONCE DAILY AND AS NEEDED   acetaminophen  (TYLENOL ) 650 MG CR tablet Take 650 mg by mouth every 8 (eight) hours as needed for pain.   ascorbic acid (VITAMIN C) 500 MG tablet Take 500 mg by mouth daily.   aspirin  EC 81 MG tablet Take 81 mg by mouth at bedtime. Swallow whole.   Blood Glucose Monitoring Suppl (ACCU-CHEK AVIVA PLUS) w/Device KIT Use to check blood sugar once daily and as needed   Calcium  Carb-Cholecalciferol (CALCIUM  500+D3 PO) Take 1 tablet by mouth daily.   finasteride  (PROSCAR ) 5 MG tablet Take 1  tablet (5 mg total) by mouth at bedtime.   gabapentin  (NEURONTIN ) 300 MG capsule Take 1 capsule (300 mg total) by mouth daily.   Lancets (ACCU-CHEK MULTICLIX) lancets Use  to check blood sugar once daily and as needed   lisinopril  (ZESTRIL ) 10 MG tablet TAKE 1 TABLET AT BEDTIME   loratadine  (CLARITIN ) 10 MG tablet Take 1 tablet (10 mg total) by mouth daily.   rosuvastatin  (CRESTOR ) 40 MG tablet TAKE 1 TABLET EVERY DAY   tamsulosin  (FLOMAX ) 0.4 MG CAPS capsule Take 1 capsule (0.4 mg total) by mouth daily after breakfast.   DULoxetine  (CYMBALTA ) 30 MG capsule Take 1 capsule (30 mg total) by mouth daily. (Patient not taking: Reported on 10/13/2023)   No facility-administered  medications prior to visit.    Review of Systems  Last metabolic panel Lab Results  Component Value Date   GLUCOSE 131 (H) 07/16/2023   NA 138 07/16/2023   K 4.5 07/16/2023   CL 100 07/16/2023   CO2 24 07/16/2023   BUN 14 07/16/2023   CREATININE 0.88 07/16/2023   EGFR 84 07/16/2023   CALCIUM  10.0 07/16/2023   PHOS 3.5 05/17/2020   PROT 7.6 07/16/2023   ALBUMIN 4.4 07/16/2023   LABGLOB 3.2 07/16/2023   AGRATIO 1.2 12/11/2021   BILITOT 0.4 07/16/2023   ALKPHOS 118 07/16/2023   AST 39 07/16/2023   ALT 42 07/16/2023   ANIONGAP 7 06/15/2022   Last lipids Lab Results  Component Value Date   CHOL 143 12/11/2021   HDL 59 12/11/2021   LDLCALC 73 12/11/2021   TRIG 53 12/11/2021   CHOLHDL 2.4 12/11/2021   Last hemoglobin A1c Lab Results  Component Value Date   HGBA1C 7.6 (A) 07/16/2023   Last thyroid  functions Lab Results  Component Value Date   TSH 0.430 (L) 10/07/2022        Objective    BP 132/67   Pulse 74   Ht 5\' 6"  (1.676 m)   Wt 163 lb (73.9 kg)   SpO2 99%   BMI 26.31 kg/m  BP Readings from Last 3 Encounters:  10/13/23 132/67  07/16/23 138/81  03/18/23 134/70   Wt Readings from Last 3 Encounters:  10/13/23 163 lb (73.9 kg)  07/16/23 171 lb (77.6 kg)  03/18/23 174 lb 6.4 oz (79.1 kg)        Physical Exam  General: Alert, no acute distress Cardio: Normal S1 and S2, RRR, no r/m/g Pulm: CTAB, normal work of breathing ABD: soft, abdomen is not distended, there is no tenderness to palpation, normal BS  Extremities: no LE edema    No results found for any visits on 10/13/23.  Assessment & Plan     Problem List Items Addressed This Visit       Cardiovascular and Mediastinum   Essential (primary) hypertension   Essential (primary) hypertension, Chronic Hypertension is well-controlled with current medication regimen. Blood pressure readings are within target range. - Continue lisinopril  10 mg daily        Respiratory   Apnea, sleep    Chronic  Stable  Does not use CPAP machine currently         Endocrine   Diabetes mellitus with diabetic neuropathy (HCC) - Primary   Chronic Type 2 diabetes mellitus with neuropathy Type 2 diabetes with recent A1c of 7.6%. Previous intolerance to Rybelsus  7 mg due to side effects including hiccups and stomach tightness. Blood glucose levels stabilized in the 130s and below after discontinuation of Rybelsus . Potential use  of Januvia 25 mg daily if A1c exceeds 8, with monitoring for hypoglycemia symptoms such as lightheadedness and dizziness. Metformin is an option given good renal function. - Order A1c test - Consider starting Januvia 25 mg daily if A1c is above 8 - Consider starting metformin 500 mg once daily for two weeks, then increase to 500 mg twice daily for eight weeks if needed - add additional medication as noted above for A1c if not at goal of less than 8       Relevant Orders   Hemoglobin A1c     Genitourinary   Benign prostatic hyperplasia with urinary obstruction   BPH symptoms are managed with current medication regimen. - Continue Flomax  0.4 mg daily - Continue finasteride  5 mg daily        Other   HLD (hyperlipidemia)   Hyperlipidemia, Chronic Hyperlipidemia is well-managed with current medication regimen. - Continue rosuvastatin  40 mg daily      Ambulates with cane   Chronic  Ambulates with assistance of cane         Assessment & Plan        Return in about 4 months (around 02/13/2024) for DM.         Mimi Alt, MD  [facility] [phone] (phone) [phone] (fax)  [facility]

## 2023-10-13 NOTE — Assessment & Plan Note (Signed)
 Chronic  Ambulates with assistance of cane

## 2023-10-13 NOTE — Assessment & Plan Note (Signed)
 Essential (primary) hypertension, Chronic Hypertension is well-controlled with current medication regimen. Blood pressure readings are within target range. - Continue lisinopril  10 mg daily

## 2023-10-13 NOTE — Assessment & Plan Note (Signed)
 BPH symptoms are managed with current medication regimen. - Continue Flomax  0.4 mg daily - Continue finasteride  5 mg daily

## 2023-10-13 NOTE — Assessment & Plan Note (Signed)
 Chronic  Stable  Does not use CPAP machine currently

## 2023-10-13 NOTE — Assessment & Plan Note (Addendum)
 Chronic Type 2 diabetes mellitus with neuropathy Type 2 diabetes with recent A1c of 7.6%. Previous intolerance to Rybelsus  7 mg due to side effects including hiccups and stomach tightness. Blood glucose levels stabilized in the 130s and below after discontinuation of Rybelsus . Potential use of Januvia 25 mg daily if A1c exceeds 8, with monitoring for hypoglycemia symptoms such as lightheadedness and dizziness. Metformin is an option given good renal function. - Order A1c test - Consider starting Januvia 25 mg daily if A1c is above 8 - Consider starting metformin 500 mg once daily for two weeks, then increase to 500 mg twice daily for eight weeks if needed - add additional medication as noted above for A1c if not at goal of less than 8

## 2023-10-14 ENCOUNTER — Ambulatory Visit: Payer: Self-pay

## 2023-10-14 LAB — HEMOGLOBIN A1C
Est. average glucose Bld gHb Est-mCnc: 151 mg/dL
Hgb A1c MFr Bld: 6.9 % — ABNORMAL HIGH (ref 4.8–5.6)

## 2023-10-30 ENCOUNTER — Other Ambulatory Visit: Payer: Self-pay | Admitting: Family Medicine

## 2023-11-12 ENCOUNTER — Ambulatory Visit (INDEPENDENT_AMBULATORY_CARE_PROVIDER_SITE_OTHER): Payer: Self-pay | Admitting: Urology

## 2023-11-12 VITALS — BP 163/77 | HR 104 | Ht 66.0 in | Wt 168.8 lb

## 2023-11-12 DIAGNOSIS — N200 Calculus of kidney: Secondary | ICD-10-CM

## 2023-11-12 DIAGNOSIS — N138 Other obstructive and reflux uropathy: Secondary | ICD-10-CM | POA: Diagnosis not present

## 2023-11-12 DIAGNOSIS — N401 Enlarged prostate with lower urinary tract symptoms: Secondary | ICD-10-CM

## 2023-11-12 LAB — BLADDER SCAN AMB NON-IMAGING

## 2023-11-12 MED ORDER — FINASTERIDE 5 MG PO TABS: 5.0000 mg | ORAL_TABLET | Freq: Every day | ORAL | 3 refills | Status: DC

## 2023-11-12 MED ORDER — TAMSULOSIN HCL 0.4 MG PO CAPS: 0.4000 mg | ORAL_CAPSULE | Freq: Every day | ORAL | 3 refills | Status: DC

## 2023-11-12 NOTE — Progress Notes (Signed)
   11/12/2023 1:48 PM   Todd Ortega 1935-05-24 [id number]  Reason for visit: Follow up BPH, nephrolithiasis  HPI: 89 year old male with long history of BPH on maximal medical therapy with Flomax  and finasteride , as well as history of nephrolithiasis and sepsis from urinary source requiring stent placement and follow-up ureteroscopy in 2021.  He denies any problems since our last visit.  PVR today is normal at 23ml.  I personally viewed and interpreted the CT scan from January 2024 that shows no evidence of stone disease, prostate measured 74 g.  No hydronephrosis on CT or follow-up renal ultrasound from June 2024.  Renal function is normal with recent creatinine 0.88, EGFR greater than 60.  He denies any dysuria, gross hematuria, or UTIs over the last year.  Has no significant urinary complaints during the day, nocturia 1-2 times at night per baseline.  He would like to continue maximal medical therapy.  Flomax  and finasteride  refilled RTC 1 year PVR  Lawerence Pressman, MD  [facility] [address], [address] [place], [place] [address] [phone]

## 2023-12-15 ENCOUNTER — Other Ambulatory Visit: Payer: Self-pay | Admitting: Physician Assistant

## 2023-12-15 DIAGNOSIS — E1140 Type 2 diabetes mellitus with diabetic neuropathy, unspecified: Secondary | ICD-10-CM

## 2023-12-25 ENCOUNTER — Encounter: Payer: Self-pay | Admitting: Ophthalmology

## 2023-12-25 NOTE — Anesthesia Preprocedure Evaluation (Addendum)
 Anesthesia Evaluation  Patient identified by MRN, date of birth, ID band Patient awake    Reviewed: Allergy & Precautions, H&P , NPO status , Patient's Chart, lab work & pertinent test results  History of Anesthesia Complications (+) history of anesthetic complications  Airway Mallampati: III  TM Distance: >3 FB Neck ROM: Full    Dental no notable dental hx.    Pulmonary neg pulmonary ROS, shortness of breath, sleep apnea    Pulmonary exam normal breath sounds clear to auscultation       Cardiovascular hypertension, + CAD  negative cardio ROS Normal cardiovascular exam+ dysrhythmias  Rhythm:Regular Rate:Normal  Echocardiogram 2D complete: 05/15/2020 IMPRESSIONS  1. Left ventricular ejection fraction, by estimation, is 65 to 70%. The left ventricle has normal function. The left ventricle has no regional wall motion abnormalities. Left ventricular diastolic parameters were normal.  2. Right ventricular systolic function is normal. The right ventricular size is normal.  3. The mitral valve is normal in structure. No evidence of mitral valve regurgitation.  4. The aortic valve is normal in structure. Aortic valve regurgitation is not visualized.   04/18/2015 INTERPRETATION NORMAL LEFT VENTRICULAR SYSTOLIC FUNCTION NORMAL RIGHT VENTRICULAR SYSTOLIC FUNCTION MILD VALVULAR REGURGITATION (See above) NO VALVULAR STENOSIS MODERATE APICAL HYPERTROPHY Normal left ventricular function ejection fraction greater than 55%  NM Myocardial Perfusion SPECT multiple (stress and rest):  Cardiac Catheterization:   Holter:  Cardiac CT Scan: 06/15/2022 IMPRESSION:  1. No acute intracranial abnormality.  2. No acute displaced fracture or traumatic listhesis of the  cervical spine.  3. Multilevel degenerative changes with associated multilevel severe  osseous neural foraminal stenosis.  4. Aortic Atherosclerosis (ICD10-I70.0).       Neuro/Psych  Neuromuscular disease negative neurological ROS  negative psych ROS   GI/Hepatic negative GI ROS, Neg liver ROS,,,  Endo/Other  negative endocrine ROSdiabetes Hyperthyroidism   Renal/GU Renal diseasenegative Renal ROS  negative genitourinary   Musculoskeletal negative musculoskeletal ROS (+) Arthritis ,    Abdominal   Peds negative pediatric ROS (+)  Hematology negative hematology ROS (+)   Anesthesia Other Findings  Coronary artery disease  Shortness of breath dyspnea Diabetes mellitus without complication (HCC) Arthritis BPH (benign prostatic hypertrophy) Hyperlipidemia Hypertension  Neuropathy Complete heart block (HCC)  Foot drop, right foot Complication of anesthesia, hard to wake up  Sleep apnea Severe sepsis with septic shock (HCC) polyneuropathy   Reproductive/Obstetrics negative OB ROS                              Anesthesia Physical Anesthesia Plan  ASA: 3  Anesthesia Plan: MAC   Post-op Pain Management:    Induction: Intravenous  PONV Risk Score and Plan:   Airway Management Planned: Natural Airway and Nasal Cannula  Additional Equipment:   Intra-op Plan:   Post-operative Plan:   Informed Consent: I have reviewed the patients History and Physical, chart, labs and discussed the procedure including the risks, benefits and alternatives for the proposed anesthesia with the patient or authorized representative who has indicated his/her understanding and acceptance.     Dental Advisory Given  Plan Discussed with: Anesthesiologist, CRNA and Surgeon  Anesthesia Plan Comments: (Patient consented for risks of anesthesia including but not limited to:  - adverse reactions to medications - damage to eyes, teeth, lips or other oral mucosa - nerve damage due to positioning  - sore throat or hoarseness - Damage to heart, brain, nerves, lungs, other parts of  body or loss of life  Patient voiced understanding  and assent.)         Anesthesia Quick Evaluation

## 2023-12-30 NOTE — Discharge Instructions (Signed)

## 2024-01-01 ENCOUNTER — Ambulatory Visit: Payer: Self-pay | Admitting: Anesthesiology

## 2024-01-01 ENCOUNTER — Encounter: Payer: Self-pay | Admitting: Ophthalmology

## 2024-01-01 ENCOUNTER — Encounter
Admission: RE | Disposition: A | Discharge status: Home / Self Care | Payer: Self-pay | Source: Home / Self Care | Attending: Ophthalmology

## 2024-01-01 ENCOUNTER — Other Ambulatory Visit: Payer: Self-pay

## 2024-01-01 ENCOUNTER — Ambulatory Visit
Admission: RE | Admit: 2024-01-01 | Discharge: 2024-01-01 | Disposition: A | Discharge status: Home / Self Care | Attending: Ophthalmology | Admitting: Ophthalmology

## 2024-01-01 DIAGNOSIS — E1136 Type 2 diabetes mellitus with diabetic cataract: Secondary | ICD-10-CM | POA: Diagnosis not present

## 2024-01-01 DIAGNOSIS — I2510 Atherosclerotic heart disease of native coronary artery without angina pectoris: Secondary | ICD-10-CM | POA: Diagnosis not present

## 2024-01-01 DIAGNOSIS — H2589 Other age-related cataract: Secondary | ICD-10-CM | POA: Diagnosis present

## 2024-01-01 DIAGNOSIS — I442 Atrioventricular block, complete: Secondary | ICD-10-CM | POA: Diagnosis not present

## 2024-01-01 DIAGNOSIS — I10 Essential (primary) hypertension: Secondary | ICD-10-CM | POA: Insufficient documentation

## 2024-01-01 HISTORY — DX: Type 2 diabetes mellitus with diabetic neuropathy, unspecified: E11.40

## 2024-01-01 HISTORY — DX: Presence of cardiac pacemaker: Z95.0

## 2024-01-01 HISTORY — DX: Atrioventricular block, complete: I44.2

## 2024-01-01 HISTORY — DX: Sick sinus syndrome: I49.5

## 2024-01-01 HISTORY — DX: Polyneuropathy, unspecified: G62.9

## 2024-01-01 HISTORY — PX: CATARACT EXTRACTION W/PHACO: SHX586

## 2024-01-01 SURGERY — PHACOEMULSIFICATION, CATARACT, WITH IOL INSERTION
Anesthesia: Monitor Anesthesia Care | Site: Eye | Laterality: Left

## 2024-01-01 MED ORDER — TETRACAINE HCL 0.5 % OP SOLN
OPHTHALMIC | Status: AC
  Filled 2024-01-01: qty 4

## 2024-01-01 MED ORDER — FENTANYL CITRATE (PF) 100 MCG/2ML IJ SOLN
INTRAMUSCULAR | Status: AC
Start: 2024-01-01 — End: 2024-01-01
  Filled 2024-01-01: qty 2

## 2024-01-01 MED ORDER — FENTANYL CITRATE (PF) 100 MCG/2ML IJ SOLN
INTRAMUSCULAR | Status: DC | PRN
  Administered 2024-01-01: 50 ug via INTRAVENOUS

## 2024-01-01 MED ORDER — BRIMONIDINE TARTRATE-TIMOLOL 0.2-0.5 % OP SOLN
OPHTHALMIC | Status: DC | PRN
  Administered 2024-01-01: 1 [drp] via OPHTHALMIC

## 2024-01-01 MED ORDER — ARMC OPHTHALMIC DILATING DROPS
OPHTHALMIC | Status: AC
  Filled 2024-01-01: qty 0.5

## 2024-01-01 MED ORDER — TRYPAN BLUE 0.06 % IO SOSY
PREFILLED_SYRINGE | INTRAOCULAR | Status: DC | PRN
  Administered 2024-01-01: .5 mL via INTRAOCULAR

## 2024-01-01 MED ORDER — SIGHTPATH DOSE#1 BSS IO SOLN
INTRAOCULAR | Status: DC | PRN
  Administered 2024-01-01: 15 mL via INTRAOCULAR

## 2024-01-01 MED ORDER — SIGHTPATH DOSE#1 NA HYALUR & NA CHOND-NA HYALUR IO KIT
PACK | INTRAOCULAR | Status: DC | PRN
Start: 2024-01-01 — End: 2024-01-01
  Administered 2024-01-01: 1 via OPHTHALMIC

## 2024-01-01 MED ORDER — MOXIFLOXACIN HCL 0.5 % OP SOLN
OPHTHALMIC | Status: DC | PRN
  Administered 2024-01-01: .2 mL via OPHTHALMIC

## 2024-01-01 MED ORDER — LACTATED RINGERS IV SOLN: INTRAVENOUS | Status: DC

## 2024-01-01 MED ORDER — MIDAZOLAM HCL 2 MG/2ML IJ SOLN
INTRAMUSCULAR | Status: AC
  Filled 2024-01-01: qty 2

## 2024-01-01 MED ORDER — ARMC OPHTHALMIC DILATING DROPS
1.0000 | OPHTHALMIC | Status: DC | PRN
  Administered 2024-01-01 (×3): 1 via OPHTHALMIC

## 2024-01-01 MED ORDER — TETRACAINE HCL 0.5 % OP SOLN
1.0000 [drp] | OPHTHALMIC | Status: DC | PRN
  Administered 2024-01-01 (×3): 1 [drp] via OPHTHALMIC

## 2024-01-01 MED ORDER — MIDAZOLAM HCL 2 MG/2ML IJ SOLN
INTRAMUSCULAR | Status: DC | PRN
  Administered 2024-01-01: 1 mg via INTRAVENOUS

## 2024-01-01 MED ORDER — LIDOCAINE HCL (PF) 2 % IJ SOLN
INTRAOCULAR | Status: DC | PRN
  Administered 2024-01-01: 4 mL via INTRAOCULAR

## 2024-01-01 MED ORDER — SIGHTPATH DOSE#1 BSS IO SOLN
INTRAOCULAR | Status: DC | PRN
  Administered 2024-01-01: 162 mL via OPHTHALMIC

## 2024-01-01 SURGICAL SUPPLY — 11 items
CATARACT SUITE SIGHTPATH (MISCELLANEOUS) ×1 IMPLANT
DISSECTOR HYDRO NUCLEUS 50X22 (MISCELLANEOUS) ×1 IMPLANT
DRSG TEGADERM 2-3/8X2-3/4 SM (GAUZE/BANDAGES/DRESSINGS) ×1 IMPLANT
FEE CATARACT SUITE SIGHTPATH (MISCELLANEOUS) ×1 IMPLANT
GLOVE BIOGEL PI IND STRL 8 (GLOVE) ×1 IMPLANT
GLOVE SURG LX STRL 7.5 STRW (GLOVE) ×1 IMPLANT
GLOVE SURG SYN 6.5 PF PI BL (GLOVE) ×1 IMPLANT
LENS IOL TECNIS MONO 22.5 (Intraocular Lens) IMPLANT
NDL FILTER BLUNT 18X1 1/2 (NEEDLE) ×1 IMPLANT
NEEDLE FILTER BLUNT 18X1 1/2 (NEEDLE) ×1 IMPLANT
SYR 3ML LL SCALE MARK (SYRINGE) ×1 IMPLANT

## 2024-01-01 NOTE — Anesthesia Postprocedure Evaluation (Signed)
 Anesthesia Post Note  Patient: Elazar Argabright.  Procedure(s) Performed: PHACOEMULSIFICATION, CATARACT, WITH IOL INSERTION 41.33 03:12.5 (Left: Eye)  Patient location during evaluation: PACU Anesthesia Type: MAC Level of consciousness: awake and alert Pain management: pain level controlled Vital Signs Assessment: post-procedure vital signs reviewed and stable Respiratory status: spontaneous breathing, nonlabored ventilation, respiratory function stable and patient connected to nasal cannula oxygen Cardiovascular status: stable and blood pressure returned to baseline Postop Assessment: no apparent nausea or vomiting Anesthetic complications: no   No notable events documented.   Last Vitals:  Vitals:   01/01/24 1430 01/01/24 1434  BP: 133/76 127/74  Pulse: 67 64  Resp: 19 12  Temp: (!) 36.1 C (!) 36.1 C  SpO2: 97% 96%    Last Pain:  Vitals:   01/01/24 1434  TempSrc:   PainSc: 0-No pain                 Brynn Reznik C Olin Gurski

## 2024-01-01 NOTE — Op Note (Signed)
 OPERATIVE NOTE  Todd Ortega [id number] 01/01/2024   PREOPERATIVE DIAGNOSIS: Mature cataract left eye. H25.89   POSTOPERATIVE DIAGNOSIS: Mature cataract left eye. H25.89   PROCEDURE:  Phacoemusification with posterior chamber intraocular lens placement of the left eye  Ultrasound time: Procedure(s): PHACOEMULSIFICATION, CATARACT, WITH IOL INSERTION 41.33 03:12.5 (Left)  LENS:   Implant Name Type Inv. Item Serial No. Manufacturer Lot No. LRB No. Used Action  LENS IOL TECNIS MONO 22.5 - [id number] Intraocular Lens LENS IOL TECNIS MONO 22.5 [id number] [employer]  Left 1 Implanted      SURGEON:  Feliciano HERO. Enola, MD   ANESTHESIA:  Topical with tetracaine  drops, augmented with 1% preservative-free intracameral lidocaine .   COMPLICATIONS:  None.   DESCRIPTION OF PROCEDURE:  The patient was identified in the holding room and transported to the operating room and placed in the supine position under the operating microscope.  The left eye was identified as the operative eye, which was prepped and draped in the usual sterile ophthalmic fashion.   A 1 millimeter clear-corneal paracentesis was made inferotemporally. Preservative-free 1% lidocaine  mixed with 1:1,000 bisulfite-free aqueous solution of epinephrine  was injected into the anterior chamber. There was no red reflex so Trypan blue  was injected intracamerally to stain the anterior capsule and facilitate safe creation of the capsulorhexis. The anterior chamber was then filled with Viscoat viscoelastic. A 2.4 millimeter keratome was used to make a clear-corneal incision superotemporally. A curvilinear capsulorrhexis was made with a cystotome and capsulorrhexis forceps. Balanced salt solution was used to hydrodissect the nucleus. Phacoemulsification was then used to remove the lens nucleus and epinucleus. The remaining cortex was then removed using the irrigation and aspiration handpiece. Provisc was then placed into the capsular bag to distend  it for lens placement. A +22.50 D [id number] intraocular lens was then injected into the capsular bag. The remaining viscoelastic was aspirated.   Wounds were hydrated with balanced salt solution.  The anterior chamber was inflated to a physiologic pressure with balanced salt solution.  No wound leaks were noted. Moxifloxacin  was injected intracamerally.  Timolol  and Brimonidine  drops were applied to the eye.  The patient was taken to the recovery room in stable condition without complications of anesthesia or surgery.  [name] 01/01/2024, 2:30 PM

## 2024-01-01 NOTE — H&P (Signed)
 [facility]   Primary Care Physician:  Sharma Coyer, MD Ophthalmologist: Dr. Feliciano Ober  Pre-Procedure History & Physical: HPI:  Todd Ortega. is a 89 y.o. male here for cataract surgery.   Past Medical History:  Diagnosis Date   Arthritis    Atrioventricular block, complete (HCC)    BPH (benign prostatic hypertrophy)    Cardiac pacemaker in situ    Complete heart block (HCC)    Complication of anesthesia    Hard to wake.   Coronary artery disease    Diabetes mellitus without complication (HCC)    borderline with no meds   Foot drop, right foot    Hyperlipidemia    Hypertension    Neuropathy    Polyneuropathy    Severe sepsis with septic shock (HCC) 05/13/2020   Shortness of breath dyspnea    Sick sinus syndrome (HCC)    Sleep apnea    Does not use CPAP   Type 2 diabetes mellitus with diabetic neuropathy [facility])     Past Surgical History:  Procedure Laterality Date   CARDIAC CATHETERIZATION     CYSTOSCOPY W/ RETROGRADES Right 06/16/2020   Procedure: CYSTOSCOPY WITH RETROGRADE PYELOGRAM;  Surgeon: Francisca Redell BROCKS, MD;  Location: [facility] ORS;  Service: Urology;  Laterality: Right;   CYSTOSCOPY WITH STENT PLACEMENT Right 05/13/2020   Procedure: CYSTOSCOPY RETROGRADE LASER  WITH STENT PLACEMENT RIGHT;  Surgeon: Francisca Redell BROCKS, MD;  Location: [facility] ORS;  Service: Urology;  Laterality: Right;   CYSTOSCOPY/URETEROSCOPY/HOLMIUM LASER/STENT PLACEMENT Right 06/16/2020   Procedure: CYSTOSCOPY/URETEROSCOPY/HOLMIUM LASER/STENT EXCHANGE;  Surgeon: Francisca Redell BROCKS, MD;  Location: [facility] ORS;  Service: Urology;  Laterality: Right;   EYE SURGERY     HERNIA REPAIR Bilateral    Inguinal Hernia Repair   MASS EXCISION     removed from left hand   PACEMAKER INSERTION N/A 04/20/2015   Procedure: INSERTION PACEMAKER;  Surgeon: Marsa Dooms, MD;  Location: [facility] ORS;  Service: Cardiovascular;  Laterality: N/A;    Prior to Admission medications   Medication Sig  Start Date End Date Taking? Authorizing Provider  acetaminophen  (TYLENOL ) 650 MG CR tablet Take 650 mg by mouth every 8 (eight) hours as needed for pain.   Yes [provider]  ascorbic acid (VITAMIN C) 500 MG tablet Take 500 mg by mouth daily.   Yes [provider]  aspirin  EC 81 MG tablet Take 81 mg by mouth at bedtime. Swallow whole.   Yes [provider]  Cholecalciferol (VITAMIN D3) 125 MCG (5000 UT) CAPS Take 1 capsule by mouth daily.   Yes [provider]  cyanocobalamin (VITAMIN B12) 1000 MCG tablet Take 1,000 mcg by mouth daily.   Yes [provider]  finasteride  (PROSCAR ) 5 MG tablet Take 1 tablet (5 mg total) by mouth at bedtime. 11/12/23  Yes Francisca Redell BROCKS, MD  gabapentin  (NEURONTIN ) 300 MG capsule Take 1 capsule (300 mg total) by mouth daily. 02/27/23  Yes Ostwalt, Janna, PA-C  lisinopril  (ZESTRIL ) 10 MG tablet TAKE 1 TABLET AT BEDTIME 10/30/23  Yes Bacigalupo, Angela M, MD  loratadine  (CLARITIN ) 10 MG tablet Take 1 tablet (10 mg total) by mouth daily. 08/16/19  Yes Bertrum Charlie CROME, MD  rosuvastatin  (CRESTOR ) 40 MG tablet TAKE 1 TABLET EVERY DAY 09/10/23  Yes Simmons-Robinson, Coyer, MD  tamsulosin  (FLOMAX ) 0.4 MG CAPS capsule Take 1 capsule (0.4 mg total) by mouth daily after breakfast. 11/12/23  Yes Francisca Redell BROCKS, MD  ACCU-CHEK GUIDE TEST test strip TEST BLOOD SUGAR  ONE TIME DAILY AND AS NEEDED 12/16/23   Simmons-Robinson, Rockie, MD  Blood Glucose Monitoring Suppl (ACCU-CHEK AVIVA PLUS) w/Device KIT Use to check blood sugar once daily and as needed 04/08/22   Drubel, Manuelita, PA-C  Calcium  Carb-Cholecalciferol (CALCIUM  500+D3 PO) Take 1 tablet by mouth daily.    [provider]  Lancets (ACCU-CHEK MULTICLIX) lancets Use  to check blood sugar once daily and as needed 04/08/22   Drubel, Manuelita, PA-C    Allergies as of 12/08/2023 - Review Complete 11/12/2023  Allergen Reaction Noted   Semaglutide  Other (See Comments) 09/29/2023    Venlafaxine Other (See Comments) 09/22/2015    Family History  Problem Relation Age of Onset   Heart attack Mother    Gout Mother    Diabetes Mother    Hypertension Mother    Hyperlipidemia Mother    Lung cancer Father    Drug abuse Brother     Social History   Socioeconomic History   Marital status: Married    Spouse name: Not on file   Number of children: 2   Years of education: Not on file   Highest education level: Some college, no degree  Occupational History   Occupation: retired   Occupation: Merchant navy officer    Comment: part time  Tobacco Use   Smoking status: Never    Passive exposure: Never   Smokeless tobacco: Never  Vaping Use   Vaping status: Never Used  Substance and Sexual Activity   Alcohol  use: Not Currently    Alcohol /week: 1.0 standard drink of alcohol     Types: 1 Cans of beer per week    Comment: twice a year/ socially   Drug use: No   Sexual activity: Yes    Birth control/protection: None  Other Topics Concern   Not on file  Social History Narrative   Not on file   Social Drivers of Health   Financial Resource Strain: Low Risk  (03/18/2023)   Overall Financial Resource Strain (CARDIA)    Difficulty of Paying Living Expenses: Not hard at all  Food Insecurity: No Food Insecurity (03/18/2023)   Hunger Vital Sign    Worried About Running Out of Food in the Last Year: Never true    Ran Out of Food in the Last Year: Never true  Transportation Needs: No Transportation Needs (03/18/2023)   PRAPARE - Administrator, Civil Service (Medical): No    Lack of Transportation (Non-Medical): No  Physical Activity: Inactive (03/18/2023)   Exercise Vital Sign    Days of Exercise per Week: 0 days    Minutes of Exercise per Session: 0 min  Stress: No Stress Concern Present (04/15/2022)   [employer] of Occupational Health - Occupational Stress Questionnaire    Feeling of Stress : Not at all  Social Connections: Socially Integrated  (03/18/2023)   Social Connection and Isolation Panel    Frequency of Communication with Friends and Family: More than three times a week    Frequency of Social Gatherings with Friends and Family: Twice a week    Attends Religious Services: More than 4 times per year    Active Member of [employer] or Organizations: Yes    Attends Engineer, structural: More than 4 times per year    Marital Status: Married  Catering manager Violence: Not At Risk (03/18/2023)   Humiliation, Afraid, Rape, and Kick questionnaire    Fear of Current or Ex-Partner: No    Emotionally Abused: No  Physically Abused: No    Sexually Abused: No    Review of Systems: See HPI, otherwise negative ROS  Physical Exam: Ht 5' 6 (1.676 m)   Wt 77.6 kg   BMI 27.60 kg/m  General:   Alert, cooperative in NAD Head:  Normocephalic and atraumatic. Respiratory:  Normal work of breathing. Cardiovascular:  RRR  Impression/Plan: Todd Ortega. is here for cataract surgery.  Risks, benefits, limitations, and alternatives regarding cataract surgery have been reviewed with the patient.  Questions have been answered.  All parties agreeable.   Feliciano Bryan Ober, MD  01/01/2024, 7:07 AM

## 2024-01-01 NOTE — Transfer of Care (Signed)
 Immediate Anesthesia Transfer of Care Note  Patient: Todd Ortega.  Procedure(s) Performed: PHACOEMULSIFICATION, CATARACT, WITH IOL INSERTION 41.33 03:12.5 (Left: Eye)  Patient Location: PACU  Anesthesia Type: MAC  Level of Consciousness: awake, alert  and patient cooperative  Airway and Oxygen Therapy: Patient Spontanous Breathing and Patient connected to supplemental oxygen  Post-op Assessment: Post-op Vital signs reviewed, Patient's Cardiovascular Status Stable, Respiratory Function Stable, Patent Airway and No signs of Nausea or vomiting  Post-op Vital Signs: Reviewed and stable  Complications: No notable events documented.

## 2024-01-09 DIAGNOSIS — H2512 Age-related nuclear cataract, left eye: Secondary | ICD-10-CM | POA: Diagnosis not present

## 2024-01-13 ENCOUNTER — Other Ambulatory Visit: Payer: Self-pay | Admitting: Urology

## 2024-01-13 ENCOUNTER — Other Ambulatory Visit: Payer: Self-pay | Admitting: Physician Assistant

## 2024-01-13 DIAGNOSIS — N138 Other obstructive and reflux uropathy: Secondary | ICD-10-CM

## 2024-01-19 DIAGNOSIS — E782 Mixed hyperlipidemia: Secondary | ICD-10-CM | POA: Diagnosis not present

## 2024-01-19 DIAGNOSIS — I2510 Atherosclerotic heart disease of native coronary artery without angina pectoris: Secondary | ICD-10-CM | POA: Diagnosis not present

## 2024-01-19 DIAGNOSIS — I10 Essential (primary) hypertension: Secondary | ICD-10-CM | POA: Diagnosis not present

## 2024-01-19 DIAGNOSIS — Z950 Presence of cardiac pacemaker: Secondary | ICD-10-CM | POA: Diagnosis not present

## 2024-01-19 DIAGNOSIS — G629 Polyneuropathy, unspecified: Secondary | ICD-10-CM | POA: Diagnosis not present

## 2024-01-19 DIAGNOSIS — I495 Sick sinus syndrome: Secondary | ICD-10-CM | POA: Diagnosis not present

## 2024-01-19 DIAGNOSIS — E119 Type 2 diabetes mellitus without complications: Secondary | ICD-10-CM | POA: Diagnosis not present

## 2024-01-19 DIAGNOSIS — I442 Atrioventricular block, complete: Secondary | ICD-10-CM | POA: Diagnosis not present

## 2024-02-04 ENCOUNTER — Encounter: Payer: Self-pay | Admitting: Urology

## 2024-02-16 ENCOUNTER — Encounter: Payer: Self-pay | Admitting: Family Medicine

## 2024-02-16 ENCOUNTER — Ambulatory Visit (INDEPENDENT_AMBULATORY_CARE_PROVIDER_SITE_OTHER): Admitting: Family Medicine

## 2024-02-16 VITALS — BP 130/64 | HR 72 | Temp 98.6°F | Ht 65.0 in | Wt 169.0 lb

## 2024-02-16 DIAGNOSIS — E1169 Type 2 diabetes mellitus with other specified complication: Secondary | ICD-10-CM | POA: Diagnosis not present

## 2024-02-16 DIAGNOSIS — E7849 Other hyperlipidemia: Secondary | ICD-10-CM

## 2024-02-16 DIAGNOSIS — E1159 Type 2 diabetes mellitus with other circulatory complications: Secondary | ICD-10-CM

## 2024-02-16 DIAGNOSIS — E1140 Type 2 diabetes mellitus with diabetic neuropathy, unspecified: Secondary | ICD-10-CM

## 2024-02-16 DIAGNOSIS — E785 Hyperlipidemia, unspecified: Secondary | ICD-10-CM

## 2024-02-16 DIAGNOSIS — I152 Hypertension secondary to endocrine disorders: Secondary | ICD-10-CM

## 2024-02-16 DIAGNOSIS — I10 Essential (primary) hypertension: Secondary | ICD-10-CM | POA: Diagnosis not present

## 2024-02-16 DIAGNOSIS — Z23 Encounter for immunization: Secondary | ICD-10-CM | POA: Diagnosis not present

## 2024-02-16 NOTE — Patient Instructions (Signed)
 To keep you healthy, please keep in mind the following health maintenance items that you are due for:   Health Maintenance Due  Topic Date Due   OPHTHALMOLOGY EXAM  04/10/2021   Influenza Vaccine  01/02/2024   Medicare Annual Wellness (AWV)  03/17/2024     Best Wishes,   Dr. Lang

## 2024-02-16 NOTE — Progress Notes (Unsigned)
 Established patient visit   Patient: Todd Ortega.   DOB: 1934/12/17   89 y.o. Male  MRN: [id number] Visit Date: 02/16/2024  Today's healthcare provider: Rockie Agent, MD   Chief Complaint  Patient presents with   Medical Management of Chronic Issues    Patient presents for DM f/u. Patient reports that he doing well, no acute issues.    Subjective     HPI     Medical Management of Chronic Issues    Additional comments: Patient presents for DM f/u. Patient reports that he doing well, no acute issues.       Last edited by Cherry Chiquita HERO, CMA on 02/16/2024  1:44 PM.       Discussed the use of AI scribe software for clinical note transcription with the patient, who gave verbal consent to proceed.  History of Present Illness An 89 year old male with diabetes presents for routine follow-up.  His diabetes is well controlled with a recent A1c of 6.9, down from 7.6. Blood sugar levels are higher in the morning, around 170 mg/dL. He previously tried Rybelsus  but discontinued it due to side effects of hiccups and stomach tightness. He is currently managing his diabetes with diet and medication, although specific medications for diabetes were not detailed.  He experiences neuropathy in his feet, which he describes as something he has to 'live with.' He is taking gabapentin  300 mg, which he believes helps, although he has not stopped taking it to see if symptoms worsen.  He is on lisinopril  10 mg for blood pressure management. His kidney function was checked seven months ago and was reported as good.  He is planning to travel to [place] for his granddaughter's wedding on October 16th and wants to ensure his health is stable for the trip.     Past Medical History:  Diagnosis Date   Arthritis    Atrioventricular block, complete (HCC)    BPH (benign prostatic hypertrophy)    Cardiac pacemaker in situ    Complete heart block (HCC)    Complication of anesthesia     Hard to wake.   Coronary artery disease    Diabetes mellitus without complication (HCC)    borderline with no meds   Foot drop, right foot    Hyperlipidemia    Hypertension    Neuropathy    Polyneuropathy    Severe sepsis with septic shock (HCC) 05/13/2020   Shortness of breath dyspnea    Sick sinus syndrome (HCC)    Sleep apnea    Does not use CPAP   Type 2 diabetes mellitus with diabetic neuropathy (HCC)     Medications: Outpatient Medications Prior to Visit  Medication Sig   ACCU-CHEK GUIDE TEST test strip TEST BLOOD SUGAR ONE TIME DAILY AND AS NEEDED   acetaminophen  (TYLENOL ) 650 MG CR tablet Take 650 mg by mouth every 8 (eight) hours as needed for pain.   ascorbic acid (VITAMIN C) 500 MG tablet Take 500 mg by mouth daily.   aspirin  EC 81 MG tablet Take 81 mg by mouth at bedtime. Swallow whole.   Blood Glucose Monitoring Suppl (ACCU-CHEK AVIVA PLUS) w/Device KIT Use to check blood sugar once daily and as needed   Calcium  Carb-Cholecalciferol (CALCIUM  500+D3 PO) Take 1 tablet by mouth daily.   Cholecalciferol (VITAMIN D3) 125 MCG (5000 UT) CAPS Take 1 capsule by mouth daily.   cyanocobalamin (VITAMIN B12) 1000 MCG tablet Take 1,000 mcg by mouth daily.  finasteride  (PROSCAR ) 5 MG tablet TAKE 1 TABLET AT BEDTIME   gabapentin  (NEURONTIN ) 300 MG capsule TAKE 1 CAPSULE EVERY DAY   Lancets (ACCU-CHEK MULTICLIX) lancets Use  to check blood sugar once daily and as needed   lisinopril  (ZESTRIL ) 10 MG tablet TAKE 1 TABLET AT BEDTIME   loratadine  (CLARITIN ) 10 MG tablet Take 1 tablet (10 mg total) by mouth daily.   rosuvastatin  (CRESTOR ) 40 MG tablet TAKE 1 TABLET EVERY DAY   tamsulosin  (FLOMAX ) 0.4 MG CAPS capsule Take 1 capsule (0.4 mg total) by mouth daily after breakfast.   No facility-administered medications prior to visit.    Review of Systems  Last metabolic panel Lab Results  Component Value Date   GLUCOSE 154 (H) 02/16/2024   NA 138 02/16/2024   K 4.4 02/16/2024    CL 102 02/16/2024   CO2 22 02/16/2024   BUN 14 02/16/2024   CREATININE 0.90 02/16/2024   EGFR 83 02/16/2024   CALCIUM  10.3 (H) 02/16/2024   PHOS 3.5 05/17/2020   PROT 7.6 07/16/2023   ALBUMIN 4.4 07/16/2023   LABGLOB 3.2 07/16/2023   AGRATIO 1.2 12/11/2021   BILITOT 0.4 07/16/2023   ALKPHOS 118 07/16/2023   AST 39 07/16/2023   ALT 42 07/16/2023   ANIONGAP 7 06/15/2022   Last lipids Lab Results  Component Value Date   CHOL 138 02/16/2024   HDL 58 02/16/2024   LDLCALC 68 02/16/2024   TRIG 58 02/16/2024   CHOLHDL 2.4 02/16/2024   Last hemoglobin A1c Lab Results  Component Value Date   HGBA1C 7.7 (H) 02/16/2024        Objective    BP 130/64 (BP Location: Left Arm, Patient Position: Sitting, Cuff Size: Normal)   Pulse 72   Temp 98.6 F (37 C) (Oral)   Ht 5' 5 (1.651 m)   Wt 169 lb (76.7 kg)   SpO2 96%   BMI 28.12 kg/m   BP Readings from Last 3 Encounters:  02/16/24 130/64  01/01/24 127/74  11/12/23 (!) 163/77   Wt Readings from Last 3 Encounters:  02/16/24 169 lb (76.7 kg)  01/01/24 164 lb 11.2 oz (74.7 kg)  11/12/23 168 lb 12.8 oz (76.6 kg)        Physical Exam Vitals reviewed.  Constitutional:      General: He is not in acute distress.    Appearance: Normal appearance. He is not ill-appearing.  Cardiovascular:     Rate and Rhythm: Normal rate and regular rhythm.  Pulmonary:     Effort: Pulmonary effort is normal. No respiratory distress.     Breath sounds: No wheezing, rhonchi or rales.  Neurological:     Mental Status: He is alert and oriented to person, place, and time.  Psychiatric:        Mood and Affect: Mood normal.        Behavior: Behavior normal.       Results for orders placed or performed in visit on 02/16/24  HgB A1c  Result Value Ref Range   Hgb A1c MFr Bld 7.7 (H) 4.8 - 5.6 %   Est. average glucose Bld gHb Est-mCnc 174 mg/dL  AFE1+ZHQM  Result Value Ref Range   Glucose 154 (H) 70 - 99 mg/dL   BUN 14 8 - 27 mg/dL    Creatinine, Ser 9.09 0.76 - 1.27 mg/dL   eGFR 83 >40 fO/fpw/8.26   BUN/Creatinine Ratio 16 10 - 24   Sodium 138 134 - 144 mmol/L   Potassium 4.4  3.5 - 5.2 mmol/L   Chloride 102 96 - 106 mmol/L   CO2 22 20 - 29 mmol/L   Calcium  10.3 (H) 8.6 - 10.2 mg/dL  Lipid panel  Result Value Ref Range   Cholesterol, Total 138 100 - 199 mg/dL   Triglycerides 58 0 - 149 mg/dL   HDL 58 >60 mg/dL   VLDL Cholesterol Cal 12 5 - 40 mg/dL   LDL Chol Calc (NIH) 68 0 - 99 mg/dL   Chol/HDL Ratio 2.4 0.0 - 5.0 ratio     Assessment & Plan     Problem List Items Addressed This Visit     Diabetes mellitus with diabetic neuropathy (HCC) - Primary   Type 2 diabetes mellitus with diabetic neuropathy Chronic  Type 2 diabetes mellitus is well-controlled with an A1c of 6.9, down from 7.6. Reports fasting blood glucose levels in the 170s, which may affect A1c. Unable to tolerate Rybelsus  due to hiccups and stomach tightness. Gabapentin  300 mg is being used for diabetic neuropathy, which is persistent but manageable. Kidney function was good seven months ago. - Check A1c and kidney function today - Continue gabapentin  300 mg for neuropathy - Consider metformin if kidney function is adequate and A1c is above target - Maintain current diet and medication regimen      Relevant Orders   HgB A1c (Completed)   BMP8+EGFR (Completed)   Hyperlipidemia associated with type 2 diabetes mellitus (HCC)   HLD associated with DM  Chronic condition  Lipid panel ordered today  Last LDL near goal  Continue crestor  40mg  daily      Hypertension associated with diabetes (HCC)   Chronic  Blood pressure is well-controlled at 130/64 mmHg. Currently on lisinopril  10 mg, which is effective for blood pressure control and kidney protection. - Continue lisinopril  10 mg daily      Other Visit Diagnoses       Immunization due       Relevant Orders   Flu vaccine HIGH DOSE PF(Fluzone Trivalent) (Completed)       Assessment  and Plan Assessment & Plan   Influenza vaccine was administered today  Patient tolerated well         Return in about 4 months (around 06/17/2024) for HTN, DM, Cholesterol.         Rockie Agent, MD  [facility] [phone] (phone) [phone] (fax)  [facility]

## 2024-02-17 DIAGNOSIS — E1140 Type 2 diabetes mellitus with diabetic neuropathy, unspecified: Secondary | ICD-10-CM | POA: Diagnosis not present

## 2024-02-17 DIAGNOSIS — E1159 Type 2 diabetes mellitus with other circulatory complications: Secondary | ICD-10-CM | POA: Diagnosis not present

## 2024-02-17 DIAGNOSIS — E785 Hyperlipidemia, unspecified: Secondary | ICD-10-CM | POA: Diagnosis not present

## 2024-02-17 DIAGNOSIS — I10 Essential (primary) hypertension: Secondary | ICD-10-CM | POA: Diagnosis not present

## 2024-02-17 DIAGNOSIS — E7849 Other hyperlipidemia: Secondary | ICD-10-CM | POA: Diagnosis not present

## 2024-02-17 DIAGNOSIS — E1169 Type 2 diabetes mellitus with other specified complication: Secondary | ICD-10-CM | POA: Diagnosis not present

## 2024-02-17 DIAGNOSIS — Z23 Encounter for immunization: Secondary | ICD-10-CM | POA: Diagnosis not present

## 2024-02-17 DIAGNOSIS — I152 Hypertension secondary to endocrine disorders: Secondary | ICD-10-CM | POA: Diagnosis not present

## 2024-02-17 LAB — BMP8+EGFR
BUN/Creatinine Ratio: 16 (ref 10–24)
BUN: 14 mg/dL (ref 8–27)
CO2: 22 mmol/L (ref 20–29)
Calcium: 10.3 mg/dL — ABNORMAL HIGH (ref 8.6–10.2)
Chloride: 102 mmol/L (ref 96–106)
Creatinine, Ser: 0.9 mg/dL (ref 0.76–1.27)
Glucose: 154 mg/dL — ABNORMAL HIGH (ref 70–99)
Potassium: 4.4 mmol/L (ref 3.5–5.2)
Sodium: 138 mmol/L (ref 134–144)
eGFR: 83 mL/min/1.73 (ref >59)

## 2024-02-17 LAB — LIPID PANEL
Chol/HDL Ratio: 2.4 ratio (ref 0.0–5.0)
Cholesterol, Total: 138 mg/dL (ref 100–199)
HDL: 58 mg/dL (ref >39)
LDL Chol Calc (NIH): 68 mg/dL (ref 0–99)
Triglycerides: 58 mg/dL (ref 0–149)
VLDL Cholesterol Cal: 12 mg/dL (ref 5–40)

## 2024-02-17 LAB — HEMOGLOBIN A1C
Est. average glucose Bld gHb Est-mCnc: 174 mg/dL
Hgb A1c MFr Bld: 7.7 % — ABNORMAL HIGH (ref 4.8–5.6)

## 2024-02-18 ENCOUNTER — Ambulatory Visit: Payer: Self-pay | Admitting: Family Medicine

## 2024-02-18 NOTE — Assessment & Plan Note (Signed)
 HLD associated with DM  Chronic condition  Lipid panel ordered today  Last LDL near goal  Continue crestor  40mg  daily

## 2024-02-18 NOTE — Assessment & Plan Note (Signed)
 Type 2 diabetes mellitus with diabetic neuropathy Chronic  Type 2 diabetes mellitus is well-controlled with an A1c of 6.9, down from 7.6. Reports fasting blood glucose levels in the 170s, which may affect A1c. Unable to tolerate Rybelsus  due to hiccups and stomach tightness. Gabapentin  300 mg is being used for diabetic neuropathy, which is persistent but manageable. Kidney function was good seven months ago. - Check A1c and kidney function today - Continue gabapentin  300 mg for neuropathy - Consider metformin if kidney function is adequate and A1c is above target - Maintain current diet and medication regimen

## 2024-02-18 NOTE — Assessment & Plan Note (Signed)
 Chronic  Blood pressure is well-controlled at 130/64 mmHg. Currently on lisinopril  10 mg, which is effective for blood pressure control and kidney protection. - Continue lisinopril  10 mg daily

## 2024-03-09 DIAGNOSIS — I442 Atrioventricular block, complete: Secondary | ICD-10-CM | POA: Diagnosis not present

## 2024-05-05 ENCOUNTER — Ambulatory Visit

## 2024-05-05 DIAGNOSIS — Z950 Presence of cardiac pacemaker: Secondary | ICD-10-CM | POA: Diagnosis not present

## 2024-05-05 DIAGNOSIS — E782 Mixed hyperlipidemia: Secondary | ICD-10-CM | POA: Diagnosis not present

## 2024-05-05 DIAGNOSIS — I495 Sick sinus syndrome: Secondary | ICD-10-CM | POA: Diagnosis not present

## 2024-05-05 DIAGNOSIS — I2510 Atherosclerotic heart disease of native coronary artery without angina pectoris: Secondary | ICD-10-CM | POA: Diagnosis not present

## 2024-05-05 DIAGNOSIS — G629 Polyneuropathy, unspecified: Secondary | ICD-10-CM | POA: Diagnosis not present

## 2024-05-05 DIAGNOSIS — E119 Type 2 diabetes mellitus without complications: Secondary | ICD-10-CM | POA: Diagnosis not present

## 2024-05-05 DIAGNOSIS — I10 Essential (primary) hypertension: Secondary | ICD-10-CM | POA: Diagnosis not present

## 2024-05-05 DIAGNOSIS — I442 Atrioventricular block, complete: Secondary | ICD-10-CM | POA: Diagnosis not present

## 2024-05-17 ENCOUNTER — Ambulatory Visit

## 2024-05-17 VITALS — Ht 66.0 in | Wt 169.0 lb

## 2024-05-17 NOTE — Patient Instructions (Addendum)
 [name],  Thank you for taking the time for your Medicare Wellness Visit. I appreciate your continued commitment to your health goals. Please review the care plan we discussed, and feel free to reach out if I can assist you further.  Please note that Annual Wellness Visits do not include a physical exam. Some assessments may be limited, especially if the visit was conducted virtually. If needed, we may recommend an in-person follow-up with your provider.  Ongoing Care Seeing your primary care provider every 3 to 6 months helps us  monitor your health and provide consistent, personalized care. Next office visit 06/17/2024.  You are due for a tetanus vaccine and can get that done at your local pharmacy.  Remember to get your eye exam results sent over to your provider's office.  Each day, aim for 6 glasses of water, plenty of protein in your diet and try to get up and walk/ stretch every hour for 5-10 minutes at a time.    Referrals If a referral was made during today's visit and you haven't received any updates within two weeks, please contact the referred provider directly to check on the status.  Recommended Screenings:  Health Maintenance  Topic Date Due   Eye exam for diabetics  10/01/2016   COVID-19 Vaccine (5 - 2025-26 season) 02/02/2024   Medicare Annual Wellness Visit  03/17/2024   Zoster (Shingles) Vaccine (1 of 2) 05/17/2024*   Complete foot exam   07/15/2024   Hemoglobin A1C  08/15/2024   Pneumococcal Vaccine for age over 52  Completed   Flu Shot  Completed   Meningitis B Vaccine  Aged Out   DTaP/Tdap/Td vaccine  Discontinued  *Topic was postponed. The date shown is not the original due date.       05/17/2024   11:08 AM  Advanced Directives  Does Patient Have a Medical Advance Directive? Yes  Type of Advance Directive Living will    Vision: Annual vision screenings are recommended for early detection of glaucoma, cataracts, and diabetic retinopathy. These exams can  also reveal signs of chronic conditions such as diabetes and high blood pressure.  Dental: Annual dental screenings help detect early signs of oral cancer, gum disease, and other conditions linked to overall health, including heart disease and diabetes.  Please see the attached documents for additional preventive care recommendations.

## 2024-05-17 NOTE — Progress Notes (Signed)
 Chief Complaint  Patient presents with   Medicare Wellness     Subjective:   Todd Ortega. is a 89 y.o. male who presents for a Medicare Annual Wellness Visit.  Visit info / Clinical Intake: Medicare Wellness Visit Type:: Subsequent Annual Wellness Visit Persons participating in visit and providing information:: patient Medicare Wellness Visit Mode:: Telephone If telephone:: video declined Since this visit was completed virtually, some vitals may be partially provided or unavailable. Missing vitals are due to the limitations of the virtual format.: Unable to obtain vitals - no equipment If Telephone or Video please confirm:: I connected with patient using audio/video enable telemedicine. I verified patient identity with two identifiers, discussed telehealth limitations, and patient agreed to proceed. Patient Location:: Home Provider Location:: Home Interpreter Needed?: No Pre-visit prep was completed: yes AWV questionnaire completed by patient prior to visit?: no Living arrangements:: lives with spouse/significant other Patient's Overall Health Status Rating: good Typical amount of pain: none Does pain affect daily life?: no Are you currently prescribed opioids?: no  Dietary Habits and Nutritional Risks How many meals a day?: 2 Eats fruit and vegetables daily?: yes Most meals are obtained by: eating out In the last 2 weeks, have you had any of the following?: none Diabetic:: (!) yes Any non-healing wounds?: no How often do you check your BS?: 1 Would you like to be referred to a Nutritionist or for Diabetic Management? : no  Functional Status Activities of Daily Living (to include ambulation/medication): Independent Ambulation: Independent with device- listed below Home Assistive Devices/Equipment: Cane Medication Administration: Independent Home Management (perform basic housework or laundry): Independent Manage your own finances?: yes Primary transportation is:  driving Concerns about vision?: no *vision screening is required for WTM* Concerns about hearing?: no  Fall Screening Falls in the past year?: 0 Number of falls in past year: 0 Was there an injury with Fall?: 0 Fall Risk Category Calculator: 0 Patient Fall Risk Level: Low Fall Risk  Fall Risk Patient at Risk for Falls Due to: No Fall Risks Fall risk Follow up: Falls evaluation completed; Falls prevention discussed  Home and Transportation Safety: All rugs have non-skid backing?: (!) no All stairs or steps have railings?: yes (basement) Grab bars in the bathtub or shower?: yes Have non-skid surface in bathtub or shower?: yes Good home lighting?: yes Regular seat belt use?: yes Hospital stays in the last year:: no  Cognitive Assessment Difficulty concentrating, remembering, or making decisions? : yes (remembering) Will 6CIT or Mini Cog be Completed: yes What year is it?: 0 points What month is it?: 0 points Give patient an address phrase to remember (5 components): [address], [place], [place] About what time is it?: 0 points Count backwards from 20 to 1: 0 points Say the months of the year in reverse: 0 points Repeat the address phrase from earlier: 0 points 6 CIT Score: 0 points  Advance Directives (For Healthcare) Does Patient Have a Medical Advance Directive?: Yes (has a will) Type of Advance Directive: Living will Copy of Living Will in Chart?: No - copy requested  Reviewed/Updated  Reviewed/Updated: Reviewed All (Medical, Surgical, Family, Medications, Allergies, Care Teams, Patient Goals)    Allergies (verified) Semaglutide  and Venlafaxine   Current Medications (verified) Outpatient Encounter Medications as of 05/17/2024  Medication Sig   ACCU-CHEK GUIDE TEST test strip TEST BLOOD SUGAR ONE TIME DAILY AND AS NEEDED   acetaminophen  (TYLENOL ) 650 MG CR tablet Take 650 mg by mouth every 8 (eight) hours  as needed for pain.   ascorbic acid (VITAMIN C) 500 MG  tablet Take 500 mg by mouth daily.   aspirin  EC 81 MG tablet Take 81 mg by mouth at bedtime. Swallow whole.   Blood Glucose Monitoring Suppl (ACCU-CHEK AVIVA PLUS) w/Device KIT Use to check blood sugar once daily and as needed   Calcium  Carb-Cholecalciferol (CALCIUM  500+D3 PO) Take 1 tablet by mouth daily.   Cholecalciferol (VITAMIN D3) 125 MCG (5000 UT) CAPS Take 1 capsule by mouth daily.   cyanocobalamin (VITAMIN B12) 1000 MCG tablet Take 1,000 mcg by mouth daily.   finasteride  (PROSCAR ) 5 MG tablet TAKE 1 TABLET AT BEDTIME   gabapentin  (NEURONTIN ) 300 MG capsule TAKE 1 CAPSULE EVERY DAY   Lancets (ACCU-CHEK MULTICLIX) lancets Use  to check blood sugar once daily and as needed   lisinopril  (ZESTRIL ) 10 MG tablet TAKE 1 TABLET AT BEDTIME   loratadine  (CLARITIN ) 10 MG tablet Take 1 tablet (10 mg total) by mouth daily.   rosuvastatin  (CRESTOR ) 40 MG tablet TAKE 1 TABLET EVERY DAY   tamsulosin  (FLOMAX ) 0.4 MG CAPS capsule Take 1 capsule (0.4 mg total) by mouth daily after breakfast.   No facility-administered encounter medications on file as of 05/17/2024.    History: Past Medical History:  Diagnosis Date   Arthritis    Atrioventricular block, complete (HCC)    BPH (benign prostatic hypertrophy)    Cardiac pacemaker in situ    Complete heart block (HCC)    Complication of anesthesia    Hard to wake.   Coronary artery disease    Diabetes mellitus without complication (HCC)    borderline with no meds   Foot drop, right foot    Hyperlipidemia    Hypertension    Neuropathy    Polyneuropathy    Severe sepsis with septic shock (HCC) 05/13/2020   Shortness of breath dyspnea    Sick sinus syndrome (HCC)    Sleep apnea    Does not use CPAP   Type 2 diabetes mellitus with diabetic neuropathy [facility])    Past Surgical History:  Procedure Laterality Date   CARDIAC CATHETERIZATION     CATARACT EXTRACTION W/PHACO Left 01/01/2024   Procedure: PHACOEMULSIFICATION, CATARACT, WITH IOL INSERTION  41.33 03:12.5;  Surgeon: Enola Feliciano Hugger, MD;  Location: [facility];  Service: Ophthalmology;  Laterality: Left;   CYSTOSCOPY W/ RETROGRADES Right 06/16/2020   Procedure: CYSTOSCOPY WITH RETROGRADE PYELOGRAM;  Surgeon: Francisca Redell BROCKS, MD;  Location: [facility];  Service: Urology;  Laterality: Right;   CYSTOSCOPY WITH STENT PLACEMENT Right 05/13/2020   Procedure: CYSTOSCOPY RETROGRADE LASER  WITH STENT PLACEMENT RIGHT;  Surgeon: Francisca Redell BROCKS, MD;  Location: [facility];  Service: Urology;  Laterality: Right;   CYSTOSCOPY/URETEROSCOPY/HOLMIUM LASER/STENT PLACEMENT Right 06/16/2020   Procedure: CYSTOSCOPY/URETEROSCOPY/HOLMIUM LASER/STENT EXCHANGE;  Surgeon: Francisca Redell BROCKS, MD;  Location: [facility];  Service: Urology;  Laterality: Right;   EYE SURGERY     HERNIA REPAIR Bilateral    Inguinal Hernia Repair   MASS EXCISION     removed from left hand   PACEMAKER INSERTION N/A 04/20/2015   Procedure: INSERTION PACEMAKER;  Surgeon: Marsa Dooms, MD;  Location: [facility];  Service: Cardiovascular;  Laterality: N/A;   Family History  Problem Relation Age of Onset   Heart attack Mother    Gout Mother    Diabetes Mother    Hypertension Mother    Hyperlipidemia Mother    Lung cancer Father    Drug abuse Brother  Social History   Occupational History   Occupation: retired   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  Tobacco Use   Smoking status: Never    Passive exposure: Never   Smokeless tobacco: Never  Vaping Use   Vaping status: Never Used  Substance and Sexual Activity   Alcohol  use: Not Currently    Alcohol /week: 1.0 standard drink of alcohol     Types: 1 Cans of beer per week    Comment: twice a year/ socially   Drug use: No   Sexual activity: Yes    Birth control/protection: None   Tobacco Counseling Counseling given: Not Answered  SDOH Screenings   Food Insecurity: No Food Insecurity (05/17/2024)  Housing: Unknown (05/17/2024)   Transportation Needs: No Transportation Needs (05/17/2024)  Utilities: Not At Risk (05/17/2024)  Alcohol  Screen: Low Risk (03/18/2023)  Depression (PHQ2-9): Low Risk (05/17/2024)  Financial Resource Strain: Low Risk (03/18/2023)  Physical Activity: Inactive (05/17/2024)  Social Connections: Socially Integrated (05/17/2024)  Stress: No Stress Concern Present (05/17/2024)  Tobacco Use: Low Risk (05/17/2024)  Health Literacy: Adequate Health Literacy (05/17/2024)   See flowsheets for full screening details  Depression Screen PHQ 2 & 9 Depression Scale- Over the past 2 weeks, how often have you been bothered by any of the following problems? Little interest or pleasure in doing things: 0 Feeling down, depressed, or hopeless (PHQ Adolescent also includes...irritable): 0 PHQ-2 Total Score: 0 Trouble falling or staying asleep, or sleeping too much: 0 Feeling tired or having little energy: 0 Poor appetite or overeating (PHQ Adolescent also includes...weight loss): 0 Feeling bad about yourself - or that you are a failure or have let yourself or your family down: 0 Trouble concentrating on things, such as reading the newspaper or watching television (PHQ Adolescent also includes...like school work): 0 Moving or speaking so slowly that other people could have noticed. Or the opposite - being so fidgety or restless that you have been moving around a lot more than usual: 0 Thoughts that you would be better off dead, or of hurting yourself in some way: 0 PHQ-9 Total Score: 0 If you checked off any problems, how difficult have these problems made it for you to do your work, take care of things at home, or get along with other people?: Not difficult at all  Depression Treatment Depression Interventions/Treatment : [id number] Score <4 Follow-up Not Indicated     Goals Addressed   None          Objective:    Today's Vitals   05/17/24 1104  Weight: 169 lb (76.7 kg)  Height: 5' 6 (1.676 m)    Body mass index is 27.28 kg/m.  Hearing/Vision screen Hearing Screening - Comments:: Denies hearing difficulties   Vision Screening - Comments:: Had cataract surgery/ UTD/[place] Immunizations and Health Maintenance Health Maintenance  Topic Date Due   OPHTHALMOLOGY EXAM  10/01/2016   COVID-19 Vaccine (5 - 2025-26 season) 02/02/2024   Zoster Vaccines- Shingrix (1 of 2) 05/17/2024 (Originally 03/12/1985)   FOOT EXAM  07/15/2024   HEMOGLOBIN A1C  08/15/2024   Medicare Annual Wellness (AWV)  05/17/2025   Pneumococcal Vaccine: 50+ Years  Completed   Influenza Vaccine  Completed   Meningococcal B Vaccine  Aged Out   DTaP/Tdap/Td  Discontinued        Assessment/Plan:  This is a routine wellness examination for Sonia.  Patient Care Team: Sharma Coyer, MD as PCP - General (Family Medicine) Florencio Cara BIRCH, MD as Consulting Physician (  Cardiology) Maree Jannett POUR, MD as Consulting Physician (Neurology) Ferrel Lionel Sotero RIGGERS (Neurology) [facility], [facility]  I have personally reviewed and noted the following in the patients chart:   Medical and social history Use of alcohol , tobacco or illicit drugs  Current medications and supplements including opioid prescriptions. Functional ability and status Nutritional status Physical activity Advanced directives List of other physicians Hospitalizations, surgeries, and ER visits in previous 12 months Vitals Screenings to include cognitive, depression, and falls Referrals and appointments  No orders of the defined types were placed in this encounter.  In addition, I have reviewed and discussed with patient certain preventive protocols, quality metrics, and best practice recommendations. A written personalized care plan for preventive services as well as general preventive health recommendations were provided to patient.   Leydi Winstead L Vontae Court, CMA   05/17/2024   Return in 1 year (on 05/17/2025).  After Visit  Summary: (Mail) Due to this being a telephonic visit, the after visit summary with patients personalized plan was offered to patient via mail   Nurse Notes: Patient is due for a Tdap.  He stated that he has an up coming appointment to have an eye exam.  Patient had no concerns to address today.

## 2024-06-17 ENCOUNTER — Ambulatory Visit: Admitting: Family Medicine

## 2024-06-25 ENCOUNTER — Telehealth: Payer: Self-pay | Admitting: Family Medicine

## 2024-06-25 NOTE — Telephone Encounter (Signed)
 Rx declined. Provider not at this office.  Pt advised. Verbalized understanding

## 2024-06-25 NOTE — Telephone Encounter (Signed)
[facility] faxed refill request for the following medications:  tamsulosin (FLOMAX) 0.4 MG CAPS capsule   Please advise.

## 2024-06-29 ENCOUNTER — Other Ambulatory Visit: Payer: Self-pay | Admitting: Urology

## 2024-07-26 ENCOUNTER — Ambulatory Visit: Admitting: Family Medicine

## 2024-11-10 ENCOUNTER — Ambulatory Visit: Admitting: Urology

## 2024-11-11 ENCOUNTER — Ambulatory Visit: Admitting: Urology
